# Patient Record
Sex: Female | Born: 1975
Health system: Southern US, Community
[De-identification: ages and names within clinical notes are randomized; demographics above are authoritative.]

## PROBLEM LIST (undated history)

## (undated) DIAGNOSIS — T8859XA Other complications of anesthesia, initial encounter: Secondary | ICD-10-CM

## (undated) DIAGNOSIS — R002 Palpitations: Secondary | ICD-10-CM

## (undated) DIAGNOSIS — E669 Obesity, unspecified: Secondary | ICD-10-CM

## (undated) DIAGNOSIS — F32A Depression, unspecified: Secondary | ICD-10-CM

## (undated) DIAGNOSIS — R12 Heartburn: Secondary | ICD-10-CM

## (undated) DIAGNOSIS — Z98811 Dental restoration status: Secondary | ICD-10-CM

## (undated) DIAGNOSIS — J45990 Exercise induced bronchospasm: Secondary | ICD-10-CM

## (undated) DIAGNOSIS — E785 Hyperlipidemia, unspecified: Secondary | ICD-10-CM

## (undated) DIAGNOSIS — R011 Cardiac murmur, unspecified: Secondary | ICD-10-CM

## (undated) DIAGNOSIS — N979 Female infertility, unspecified: Secondary | ICD-10-CM

## (undated) DIAGNOSIS — M549 Dorsalgia, unspecified: Secondary | ICD-10-CM

## (undated) DIAGNOSIS — E611 Iron deficiency: Secondary | ICD-10-CM

## (undated) DIAGNOSIS — G4489 Other headache syndrome: Secondary | ICD-10-CM

## (undated) DIAGNOSIS — T4145XA Adverse effect of unspecified anesthetic, initial encounter: Secondary | ICD-10-CM

## (undated) DIAGNOSIS — F419 Anxiety disorder, unspecified: Secondary | ICD-10-CM

## (undated) DIAGNOSIS — I493 Ventricular premature depolarization: Secondary | ICD-10-CM

## (undated) DIAGNOSIS — J45909 Unspecified asthma, uncomplicated: Secondary | ICD-10-CM

## (undated) DIAGNOSIS — R112 Nausea with vomiting, unspecified: Secondary | ICD-10-CM

## (undated) DIAGNOSIS — R0602 Shortness of breath: Secondary | ICD-10-CM

## (undated) DIAGNOSIS — S60311A Abrasion of right thumb, initial encounter: Secondary | ICD-10-CM

## (undated) DIAGNOSIS — E119 Type 2 diabetes mellitus without complications: Secondary | ICD-10-CM

## (undated) DIAGNOSIS — Z9889 Other specified postprocedural states: Secondary | ICD-10-CM

## (undated) DIAGNOSIS — M6530 Trigger finger, unspecified finger: Secondary | ICD-10-CM

## (undated) DIAGNOSIS — J302 Other seasonal allergic rhinitis: Secondary | ICD-10-CM

## (undated) DIAGNOSIS — R7303 Prediabetes: Secondary | ICD-10-CM

## (undated) DIAGNOSIS — G5603 Carpal tunnel syndrome, bilateral upper limbs: Secondary | ICD-10-CM

## (undated) DIAGNOSIS — R5383 Other fatigue: Secondary | ICD-10-CM

## (undated) HISTORY — PX: TRIGGER FINGER RELEASE: SHX641

## (undated) HISTORY — DX: Unspecified asthma, uncomplicated: J45.909

## (undated) HISTORY — DX: Female infertility, unspecified: N97.9

## (undated) HISTORY — DX: Obesity, unspecified: E66.9

## (undated) HISTORY — DX: Dorsalgia, unspecified: M54.9

## (undated) HISTORY — DX: Depression, unspecified: F32.A

## (undated) HISTORY — DX: Shortness of breath: R06.02

## (undated) HISTORY — DX: Heartburn: R12

## (undated) HISTORY — DX: Other specified postprocedural states: Z98.890

## (undated) HISTORY — PX: DILATION AND CURETTAGE OF UTERUS: SHX78

## (undated) HISTORY — PX: OTHER SURGICAL HISTORY: SHX169

## (undated) HISTORY — DX: Trigger finger, unspecified finger: M65.30

## (undated) HISTORY — DX: Other fatigue: R53.83

## (undated) HISTORY — PX: ABDOMINAL HYSTERECTOMY: SHX81

## (undated) HISTORY — DX: Type 2 diabetes mellitus without complications: E11.9

## (undated) HISTORY — DX: Palpitations: R00.2

## (undated) HISTORY — DX: Anxiety disorder, unspecified: F41.9

## (undated) HISTORY — PX: LASIK: SHX215

## (undated) HISTORY — DX: Hyperlipidemia, unspecified: E78.5

---

## 2000-05-09 ENCOUNTER — Encounter: Payer: Self-pay | Admitting: *Deleted

## 2000-05-09 ENCOUNTER — Encounter: Admission: RE | Admit: 2000-05-09 | Discharge: 2000-05-09 | Payer: Self-pay | Admitting: *Deleted

## 2000-09-16 ENCOUNTER — Other Ambulatory Visit: Admission: RE | Admit: 2000-09-16 | Discharge: 2000-09-16 | Payer: Self-pay | Admitting: Obstetrics and Gynecology

## 2000-09-19 ENCOUNTER — Encounter (INDEPENDENT_AMBULATORY_CARE_PROVIDER_SITE_OTHER): Payer: Self-pay | Admitting: Specialist

## 2000-09-19 ENCOUNTER — Ambulatory Visit (HOSPITAL_COMMUNITY): Admission: RE | Admit: 2000-09-19 | Discharge: 2000-09-19 | Payer: Self-pay | Admitting: Obstetrics and Gynecology

## 2001-04-13 ENCOUNTER — Ambulatory Visit (HOSPITAL_COMMUNITY): Admission: RE | Admit: 2001-04-13 | Discharge: 2001-04-13 | Payer: Self-pay | Admitting: Obstetrics and Gynecology

## 2001-04-15 ENCOUNTER — Ambulatory Visit (HOSPITAL_COMMUNITY): Admission: RE | Admit: 2001-04-15 | Discharge: 2001-04-15 | Payer: Self-pay | Admitting: Obstetrics and Gynecology

## 2001-06-05 ENCOUNTER — Ambulatory Visit (HOSPITAL_COMMUNITY): Admission: RE | Admit: 2001-06-05 | Discharge: 2001-06-05 | Payer: Self-pay | Admitting: Obstetrics and Gynecology

## 2001-10-16 ENCOUNTER — Other Ambulatory Visit: Admission: RE | Admit: 2001-10-16 | Discharge: 2001-10-16 | Payer: Self-pay | Admitting: Obstetrics and Gynecology

## 2002-02-17 ENCOUNTER — Ambulatory Visit (HOSPITAL_COMMUNITY): Admission: RE | Admit: 2002-02-17 | Discharge: 2002-02-17 | Payer: Self-pay | Admitting: Obstetrics and Gynecology

## 2002-02-18 ENCOUNTER — Encounter: Admission: RE | Admit: 2002-02-18 | Discharge: 2002-02-18 | Payer: Self-pay | Admitting: Obstetrics and Gynecology

## 2002-03-24 ENCOUNTER — Encounter (HOSPITAL_COMMUNITY): Admission: AD | Admit: 2002-03-24 | Discharge: 2002-04-23 | Payer: Self-pay | Admitting: Obstetrics and Gynecology

## 2002-04-21 ENCOUNTER — Ambulatory Visit (HOSPITAL_COMMUNITY): Admission: RE | Admit: 2002-04-21 | Discharge: 2002-04-21 | Payer: Self-pay | Admitting: Obstetrics and Gynecology

## 2002-04-23 ENCOUNTER — Inpatient Hospital Stay (HOSPITAL_COMMUNITY): Admission: RE | Admit: 2002-04-23 | Discharge: 2002-04-26 | Payer: Self-pay | Admitting: Obstetrics and Gynecology

## 2002-04-23 ENCOUNTER — Encounter (INDEPENDENT_AMBULATORY_CARE_PROVIDER_SITE_OTHER): Payer: Self-pay

## 2002-06-02 ENCOUNTER — Other Ambulatory Visit: Admission: RE | Admit: 2002-06-02 | Discharge: 2002-06-02 | Payer: Self-pay | Admitting: Obstetrics and Gynecology

## 2003-07-20 ENCOUNTER — Other Ambulatory Visit: Admission: RE | Admit: 2003-07-20 | Discharge: 2003-07-20 | Payer: Self-pay | Admitting: Obstetrics and Gynecology

## 2004-10-04 ENCOUNTER — Other Ambulatory Visit: Admission: RE | Admit: 2004-10-04 | Discharge: 2004-10-04 | Payer: Self-pay | Admitting: Obstetrics and Gynecology

## 2005-02-02 ENCOUNTER — Ambulatory Visit (HOSPITAL_COMMUNITY): Admission: RE | Admit: 2005-02-02 | Discharge: 2005-02-02 | Payer: Self-pay | Admitting: Obstetrics and Gynecology

## 2005-03-28 ENCOUNTER — Encounter (INDEPENDENT_AMBULATORY_CARE_PROVIDER_SITE_OTHER): Payer: Self-pay | Admitting: *Deleted

## 2005-03-28 ENCOUNTER — Inpatient Hospital Stay (HOSPITAL_COMMUNITY): Admission: AD | Admit: 2005-03-28 | Discharge: 2005-03-31 | Payer: Self-pay | Admitting: Obstetrics and Gynecology

## 2005-05-08 ENCOUNTER — Other Ambulatory Visit: Admission: RE | Admit: 2005-05-08 | Discharge: 2005-05-08 | Payer: Self-pay | Admitting: Obstetrics and Gynecology

## 2006-09-26 ENCOUNTER — Encounter (INDEPENDENT_AMBULATORY_CARE_PROVIDER_SITE_OTHER): Payer: Self-pay | Admitting: *Deleted

## 2006-09-26 ENCOUNTER — Ambulatory Visit: Payer: Self-pay | Admitting: Internal Medicine

## 2007-02-26 ENCOUNTER — Encounter: Admission: RE | Admit: 2007-02-26 | Discharge: 2007-05-27 | Payer: Self-pay | Admitting: Obstetrics and Gynecology

## 2007-04-28 ENCOUNTER — Ambulatory Visit (HOSPITAL_COMMUNITY): Admission: RE | Admit: 2007-04-28 | Discharge: 2007-04-28 | Payer: Self-pay | Admitting: *Deleted

## 2009-07-05 ENCOUNTER — Emergency Department (HOSPITAL_COMMUNITY): Admission: EM | Admit: 2009-07-05 | Discharge: 2009-07-05 | Payer: Self-pay | Admitting: Family Medicine

## 2009-07-06 ENCOUNTER — Emergency Department (HOSPITAL_COMMUNITY): Admission: EM | Admit: 2009-07-06 | Discharge: 2009-07-06 | Payer: Self-pay | Admitting: Emergency Medicine

## 2009-08-09 ENCOUNTER — Encounter: Admission: RE | Admit: 2009-08-09 | Discharge: 2009-09-01 | Payer: Self-pay | Admitting: Internal Medicine

## 2009-12-12 ENCOUNTER — Encounter
Admission: RE | Admit: 2009-12-12 | Discharge: 2009-12-27 | Payer: Self-pay | Admitting: Physical Medicine & Rehabilitation

## 2009-12-13 ENCOUNTER — Ambulatory Visit: Payer: Self-pay | Admitting: Physical Medicine & Rehabilitation

## 2009-12-21 ENCOUNTER — Ambulatory Visit (HOSPITAL_COMMUNITY)
Admission: RE | Admit: 2009-12-21 | Discharge: 2009-12-21 | Payer: Self-pay | Admitting: Physical Medicine & Rehabilitation

## 2009-12-27 ENCOUNTER — Ambulatory Visit: Payer: Self-pay | Admitting: Physical Medicine & Rehabilitation

## 2010-02-16 ENCOUNTER — Ambulatory Visit (HOSPITAL_COMMUNITY): Admission: RE | Admit: 2010-02-16 | Discharge: 2010-02-16 | Payer: Self-pay | Admitting: Gynecology

## 2011-03-04 LAB — POCT PREGNANCY, URINE: Preg Test, Ur: NEGATIVE

## 2011-04-13 NOTE — Op Note (Signed)
[facility]  Patient:    Margaret [name], Margaret [name] Visit Number: [id number] MRN: [id number]          Service Type: ANT Location: [facility] Attending Physician:  Minette Headland Dictated by:   Juluis Mire, M.D. Proc. Date: 04/23/02 Admit Date:  03/24/2002 Discharge Date: 04/23/2002                             Operative Report  PREOPERATIVE DIAGNOSES:       Twin pregnancy at 37 weeks with mature LS and PG, both infants in the transverse lie.  POSTOPERATIVE DIAGNOSES:      Twin pregnancy at 37 weeks with mature LS and PG, both infants in the transverse lie.  OPERATIVE PROCEDURE:          Primary low transverse cesarean section.  SURGEON:                      Juluis Mire, M.D.  ANESTHESIA:                   Spinal.  ESTIMATED BLOOD LOSS:         800 cc.  PACKS AND DRAINS:             None.  INTRAOPERATIVE BLOOD PLACED:  None.  COMPLICATIONS:                None.  INDICATIONS:                  Dictated in history and physical.  PROCEDURE AS FOLLOWS:         Patient taken to the OR and placed in supine position with left lateral tilt.  After a satisfactory level of spinal anesthesia was obtained, the abdomen was prepped out with Betadine, draped as a sterile field.  Low transverse skin incision made with the knife and carried through subcutaneous tissue.  The anterior rectus fascia was entered sharply and the incision of the fascia extended laterally.  The fascia taken off the muscles superiorly and inferiorly.  Rectus muscles were separated in the midline.  Perineum was entered sharply and the incision and perineum extended both superiorly and inferiorly.  Low transverse bladder flap was developed. Low transverse uterine incision was begun with the knife and extended laterally using manual retraction.  Twin A presented in the transverse lie back down presentation.  Membranes were ruptured.  Fluid was clear.  Was converted to a breech  presentation and delivered.  The infant was a viable female who weighed 5 pounds 5 ounces.  Apgars were 8/8.  Umbilical cord pH was 7.33.  Twin B was also transverse.  We converted to a vertex and delivered with elevation of head and fundal pressure.  Amniotic fluid was clear.  Twin B was a viable female weighing 6 pounds 4 ounces.  Apgars were 8/8.  Umbilical cord pH was 7.31.  Placenta was then delivered manually and sent for pathologic review.  Uterus wiped free of remaining membranes and placenta. Uterus closed interlocking suture of 0 chromic using a two layer closure technique.  Some bleeding from the right side of the incision brought under control with figure-of-eight of 0 chromic.  Uterus exteriorized.  Tubes and ovaries were unremarkable.  Uterus was returned to the abdominal cavity. Pelvic cavity was thoroughly irrigated.  Hemostasis was excellent.  Urine output was clear and adequate.  Muscles reapproximated running suture  of 3-0 Vicryl.  Fascia closed with running suture of 0 PDS.  Skin was closed with staples and Steri-Strips.  Sponge, instrument, needle count reported as correct by circulated nurse x2.  Patient tolerated procedure well.  Was returned to recovery room in good condition. Dictated by:   Juluis Mire, M.D. Attending Physician:  Minette Headland DD:  04/23/02 TD:  04/24/02 Job: [id number]

## 2011-04-13 NOTE — Assessment & Plan Note (Signed)
[facility]                         GASTROENTEROLOGY OFFICE NOTE   LINDSIE, SIMAR                         MRN:          [id number]  DATE:10/09/2007                            DOB:          07/10/1976    Betheny has been having some rare rectal bleeding, and some post  defecation perianal pruritus.  She had been given some Calmoseptine,  which helped, but kind of burned, and she feels a protrusion at the anus  as well.   Anoscopic exam is performed after inspection of an anorectal showed a  small tag at the 9 o'clock position while she was in the left lateral  decubitus position.  Anoscopy showed some small hemorrhoids in the  canal.  These are slightly inflamed.  There is no significant perianal  dermatitis.   Note:  Medical staff was present while I performed the exam.   ASSESSMENT:  I think she has internal hemorrhoid symptoms and problems.   PLAN:  Analpram cream to be used intermittently.  She is also to try  some sitz baths.  She will let me know how this is going.   Her bowel movements are regular, though these things flared up when she  was taking some iron, and she was somewhat constipated.     Iva Boop, MD,FACG  Electronically Signed    CEG/MedQ  DD: 10/09/2007  DT: 10/10/2007  Job #: [id number]

## 2011-04-13 NOTE — Op Note (Signed)
Margaret [name], Margaret [name]                ACCOUNT NO.:  [id number]   MEDICAL RECORD NO.:  [id number]          PATIENT TYPE:  INP   LOCATION:  9134                          [facility]ILITY[facility]   PHYSICIAN:  Duke Salvia. Marcelle Overlie, M.D.DATE OF BIRTH:  01/02/1976   DATE OF PROCEDURE:  03/28/2005  DATE OF DISCHARGE:                                 OPERATIVE REPORT   PREOPERATIVE DIAGNOSIS:  1.  Previous cesarean section.  2.  36-2/[redacted] weeks gestation.  3.  IUGR with oligohydramnios.   POSTOPERATIVE DIAGNOSIS:  1.  Previous cesarean section.  2.  36-2/[redacted] weeks gestation.  3.  IUGR with oligohydramnios.   PROCEDURE:  Repeat low transverse cesarean section.   SURGEON:  Duke Salvia. Marcelle Overlie, M.D.   ANESTHESIA:  Spinal.   COMPLICATIONS:  None.   DRAINS:  Foley catheter.   ESTIMATED BLOOD LOSS:  800 mL.   DESCRIPTION OF PROCEDURE:  The patient was taken to the operating room.  After an adequate level of spinal anesthetic was obtained with the patient  in the left tilt position, the abdomen was prepped and draped in the usual  fashion for sterile abdominal procedures.  Foley catheter positioned  draining clear urine.  Transverse incision was made through the old scar  which was well-healed.  This was carried down to the fascia which was  incised and extended transversely.  Rectus muscle divided in the midline.  Peritoneum entered superiorly without incident and extended vertically.  The  bladder blade was positioned.  The vesicouterine serosa was incised and the  bladder was bluntly and sharply dissected off of the lower uterine segment.  A moderate amount of bladder scarring was noted.  The decision was made to  stay a little bit higher on the uterus which was incised in the lower  segment, extended with bandage scissors transversely.  Clear fluid then  noted.  The patient delivered of a 4 pound 4 ounce female, Apgars 9 and 9, pH  7.32.  The infant was suctioned.  Cord clamped and passed to the  pediatric  team for further care.  The placenta was removed manually intact, was sent  to pathology.  The uterus was exteriorized.  The cavity was wiped clean with  laparotomy pack.  Closure obtained in the first layer of 0 chromic in a  locked fashion followed by an imbricating layer of 0 chromic.  Additional  interrupted sutures were required for complete hemostasis due to the  scarring.  The bladder was retrofilled with 300 mL of sterile milk solution  and was noted to be intact.  The uterine incision was hemostatic.  Tubes and  ovaries were normal.  Prior to closure, needle, sponge, and instrument  counts correct x2.  The peritoneum closed with a running 3-0 Vicryl suture.  The rectus muscle reapproximated in the  midline with 3-0 Vicryl sutures.  The fascia closed from laterally to  midline on either side with a 0 PDS suture.  The subcutaneous fat was  hemostatic.  Clips and Steri-Strips were used on the skin along with a  pressure dressing.  She did  receive Pitocin IV and Ancef 1 gram IV after the  cord was clamped.  Mother and baby doing well at that point.      RMH/MEDQ  D:  03/28/2005  T:  03/28/2005  Job:  [id number]

## 2011-04-13 NOTE — H&P (Signed)
[facility]  Patient:    Margaret [name], Margaret [name] Visit Number: [id number] MRN: [id number]          Service Type: ANT Location: [facility] Attending Physician:  Minette Headland Dictated by:   Juluis Mire, M.D. Admit Date:  03/24/2002 Discharge Date: 04/23/2002                           History and Physical  REASON FOR ADMISSION:         The patient is a 35 year old gravida 4 para 0 abortus 3 married white female who presents for primary cesarean section.  She is carrying twin pregnancy at 37 weeks.  Last menstrual period was August 07, 2001 giving her an estimated date of confinement of May 15, 2002.  This was consistent with early ultrasound and evaluation.  Again, the patients pregnancy has been complicated by twin pregnancy.  The infants have been in a transverse-transverse lie for the majority of her third trimester.  Because of increasing discomfort, the patient underwent amniocentesis on May 27.  L/S was 7.3/1 with PG.  Subsequent nonstress test was reactive.  The infant continued to present in the transverse lie presentation.  In view of the mature studies and the abnormal presentation, the patient will undergo primary cesarean section as scheduled.  Her prenatal course was complicated by history of recurrent pregnancy losses. She had declined any type of a genetic evaluation with this pregnancy.  Other complicating factors included gestational diabetes.  She was managed by diet and blood sugars remained excellent.  Most fastings were between 80 and 90; two hours were under 120.  Throughout the pregnancy she was managed with weekly nonstress testings starting at 32 weeks, and serial ultrasounds indicating no issues.  ALLERGIES:                    No known drug allergies.  MEDICATIONS:                  Prenatal vitamins.  PRENATAL LABORATORY DATA:     The patient is O negative with negative antibody screen.  Nonreactive serology.  Positive  rubella.  Negative hepatitis B surface antigen.  HIV is nonreactive.  For past medical history, family history, and social history please see prenatal records.  REVIEW OF SYSTEMS:            Noncontributory.  PHYSICAL EXAMINATION:  VITAL SIGNS:                  Patient is afebrile with stable vital signs.  HEENT:                        Patient is normocephalic.  Pupils are equal, round, and reactive to light and accommodation.  Extraocular movements were intact.  Sclerae and conjunctivae were clear.  Oropharynx clear.  NECK:                         Without thyromegaly.  BREASTS:                      Glandular but no discrete masses.  LUNGS:                        Clear.  CARDIOVASCULAR:               Regular  rhythm and rate with a grade 2/6 systolic ejection murmur.  No clicks or gallops.  ABDOMEN:                      Confirmed a gravid uterus with fetal heart tones audible.  PELVIC:                       Deferred.  EXTREMITIES:                  Trace edema.  NEUROLOGIC:                   Grossly within normal limits.  Deep tendon reflexes 2+, no clonus.  IMPRESSION:                   1. Twin pregnancy at 37 weeks with mature L/S                                   ratio and prostaglandin.                               2. Transverse-transverse lie.                               3. Gestational diabetes.   PLAN:                         The patient to undergo primary cesarean section. The risks of surgery have been discussed, including the risk of infection; the risk of hemorrhage that could necessitate transfusion with the risk of AIDS or hepatitis; the risk of injury to adjacent organs including bladder, bowel, or ureters that could require further exploratory surgery; the risk of deep venous thrombosis and pulmonary embolus.  The patient professed an understanding of the indications and risks. Dictated by:   Juluis Mire, M.D. Attending Physician:  Minette Headland DD:  04/23/02 TD:  04/23/02 Job: [id number]

## 2011-04-13 NOTE — H&P (Signed)
[facility]  Patient:    Margaret [name], Margaret [name]                       MRN: [id number] Adm. Date:  [id number] Attending:  Frederich Balding                         History and Physical  HISTORY OF PRESENT ILLNESS:   The patient is a 35 year old, gravida 1, para 0, married white female who presents for D&E for management of nonviable first trimester pregnancy.  The patient was seen in the office for initial OB evaluation.  Fetal heart tones were not audible.  Subsequent ultrasound revealed a gestational sac with fetal pole consistent with 7 weeks 5 days, no fetal heart activity was noted, consistent with a nonviable frist trimester pregnancy.  She then had a follow-up ultrasound two days later to confirm and now presents for D&E for nonviable pregnancy.  I noted her blood type is O negative and RhoGAM will be required.  ALLERGIES:  No known drug allergies.  MEDICATIONS:  Prenatal vitamins.  PAST MEDICAL HISTORY:  Usual childhood diseases.  No significant sequelae.  No previous surgical or obstetrical history.  FAMILY HISTORY:  Noncontributory.  SOCIAL HISTORY:  Reveals no tobacco or alcohol use.  REVIEW OF SYSTEMS:  Noncontributory.  PHYSICAL EXAMINATION:  VITAL SIGNS:  The patient is afebrile with stable vital signs.  HEENT:  The patient is normocephalic.  Pupils are equally round and reactive to light and accommodation.  Extraocular movements were intact.  Sclerae and conjunctivae clear.  Oropharynx clear.  NECK:  Without thyromegaly.  BREASTS:  No discrete masses, but glandular.  LUNGS:  Clear.  HEART:  Regular rate and rhythm with a grade 2/6 systolic ejection murmur, no clicks or gallops.  ABDOMEN:  Benign.  No masses, organomegaly, or tenderness.  PELVIC:  Normal external genitalia.  Vaginal mucosa is clear.  Cervix is unremarkable.  Minimal bleeding is noted.  The uterus is approximately 8 weeks in size.  Adnexa  unremarkable.  EXTREMITIES:  Trace edema.  NEUROLOGICAL:  Grossly within normal limits.  IMPRESSION:  Nonviable first trimester pregnancy.  PLAN:  The patient will undergo dilatation and evacuation.  The risks have been discussed including risks of infection, the risks of hemorrhage that could necessitate transfusion with the risk of AIDS or hepatitis or possible need for repeat D&C.  The risks of uterine perforation that could lead to injury to adjacent organs requiring diagnostic laparoscopy or exploratory laparotomy for management.  The patients blood type again is O negative and RhoGAM will be required. DD:  09/19/00 TD:  09/19/00 Job: [id number]

## 2011-04-13 NOTE — H&P (Signed)
Margaret [name], Margaret [name]                ACCOUNT NO.:  [id number]   MEDICAL RECORD NO.:  [id number]          PATIENT TYPE:  MAT   LOCATION:  [facility]                          [facility]ILITY:  [facility]   PHYSICIAN:  Duke Salvia. Marcelle Overlie, M.D.DATE OF BIRTH:  22-Jul-1976   DATE OF ADMISSION:  03/28/2005  DATE OF DISCHARGE:                                HISTORY & PHYSICAL   CHIEF COMPLAINT:  SGA.   HISTORY OF PRESENT ILLNESS:  A 35 year old G5 P1-0-3-2 at 4 and two-  sevenths weeks. The patient has a history of prior CS for twins in 2003. She  has gestational diabetes and was seen in the office today for a routine  ultrasound with ultrasound showing vertex, 32 weeks 6 days, EFW was 1834 g  which is right at the first percentile with an AFI of 6.0 cm, less than the  third percentile. She presents at this time for admission and further  evaluation.   Blood type is O negative, antibody screen was negative, rubella titer was  positive, group B strep screen has not been done yet.   PAST MEDICAL HISTORY:  Allergies:  None. Operations:  Cesarean section in  2003. She has had an SAB x3. History of carpal tunnel syndrome and  anovulation; she conceived with Clomid.   FAMILY HISTORY:  Please see the [facility] form for details.   PHYSICAL EXAMINATION:  VITAL SIGNS:  Temperature 98.2, blood pressure  112/80.  HEENT:  Unremarkable.  NECK:  Supple without masses.  LUNGS:  Clear.  CARDIOVASCULAR:  Regular rate and rhythm without murmurs, rubs, gallops  noted.  BREASTS:  Not examined.  ABDOMEN:  A 35 cm fundal height. Fetal heart rate was 140.  PELVIC:  Cervix was long and closed.  EXTREMITIES AND NEUROLOGIC:  Unremarkable.   IMPRESSION:  1. Thirty-six and two-sevenths weeks intrauterine pregnancy.  2. Prior cesarean.  3. Small for gestational age by ultrasound criteria with a low amniotic      fluid index.   PLAN:  Will admit and hydrate and monitor, and proceed with repeat cesarean  section per the patient's  request. This procedure including the risks of  bleeding, infection, transfusion, adjacent organ injury reviewed with her,  which she understands and accepts.       RMH/MEDQ  D:  03/28/2005  T:  03/28/2005  Job:  [id number]

## 2011-04-13 NOTE — Discharge Summary (Signed)
[facility]  Patient:    Margaret [name], Margaret [name] Visit Number: [id number] MRN: [id number]          Service Type: OBS Location: 910A [id number] [id number] Attending Physician:  Frederich Balding Dictated by:   Julio Sicks, N.P. Admit Date:  04/23/2002 Discharge Date: 04/26/2002                             Discharge Summary  ADMISSION DIAGNOSES:          1. Twin pregnancy at 37 weeks.                               2. Mature L/S ratio and prostaglandin.                               3. Transverse/transverse lie.                               4. Gestational diabetes.  DISCHARGE DIAGNOSES:          1. Status post cesarean section.                               2. Two Viable female infants.  PROCEDURE:                    Primary low transverse cesarean section.  REASON FOR ADMISSION:         Please see dictated H&P.  HOSPITAL COURSE:              The patient was admitted to [facility] for a scheduled primary cesarean section.  The patient had a twin gestation with transverse lie presentation of both twins.  Amniocentesis was performed revealing an L/S ratio of 7.31 with prostaglandin.  RhoGAM was administered in the office past amniocentesis for prophylaxis.  The patients pregnancy had also been complicated by gestational diabetes.  The patient was taken to the operating room for the above named procedure.  Spinal anesthesia was administered without difficulty.  A low transverse incision was made with the delivery of the first twin, a female infant weighing 5 pounds 5 ounces, Apgars were 8 at one minute and 8 at five minutes.  Umbilical cord pH revealed 7.33.  Twin B was delivered also a female infant weighing 6 pounds 4 ounces. Apgars were 8 at one minute and 8 at five minutes.  Umbilical cord pH was 7.31. The patient tolerated the procedure well and was taken to the recovery room in good condition.  On postoperative day #1, the patient had good  return of bowel function and she was ambulating well in her room.  Abdomen was soft. Abdominal dressing was clean, dry, and intact.  Lungs were clear to auscultation.  Foley was discontinued and the patient was voiding without difficulty.  Labs revealed a hemoglobin of 9.4, hematocrit 27.8, WBC count of 10.7.  On postoperative day #2, abdomen was soft.  Incision was clean, dry, and intact.  The patient was tolerating a regular diet without complaints of nausea and vomiting.  On postoperative day #3, the patient was doing well. Incision was clean, dry, and intact.  Staples were removed and the patient was discharged  home.  CONDITION ON DISCHARGE:       Good.  DIET:                         Regular as tolerated.  FOLLOW-UP:                    The patient is to follow up in the office in one to two weeks for an incision check.  She is to call for a temperature greater than 100 degrees, persistent nausea and vomiting, heavy vaginal bleeding, and/or redness or drainage from the incision site.  DISCHARGE MEDICATIONS:        1. Tylox one p.o. every four to six hours p.r.n.                                  pain.                               2. Prenatal vitamins one p.o. daily. Dictated by:   Julio Sicks, N.P. Attending Physician:  Frederich Balding DD:  05/17/02 TD:  05/18/02 Job: [id number]

## 2012-11-26 HISTORY — PX: REDUCTION MAMMAPLASTY: SUR839

## 2012-12-04 ENCOUNTER — Other Ambulatory Visit: Payer: Self-pay | Admitting: Obstetrics and Gynecology

## 2013-08-04 ENCOUNTER — Other Ambulatory Visit: Payer: Self-pay

## 2013-08-04 DIAGNOSIS — Z01818 Encounter for other preprocedural examination: Secondary | ICD-10-CM

## 2013-08-04 DIAGNOSIS — Z1231 Encounter for screening mammogram for malignant neoplasm of breast: Secondary | ICD-10-CM

## 2013-08-25 ENCOUNTER — Ambulatory Visit
Admission: RE | Admit: 2013-08-25 | Discharge: 2013-08-25 | Disposition: A | Discharge status: Home / Self Care | Payer: 59 | Source: Ambulatory Visit

## 2013-08-25 DIAGNOSIS — Z1231 Encounter for screening mammogram for malignant neoplasm of breast: Secondary | ICD-10-CM

## 2013-08-25 DIAGNOSIS — Z01818 Encounter for other preprocedural examination: Secondary | ICD-10-CM

## 2013-09-14 ENCOUNTER — Telehealth: Payer: Self-pay | Admitting: Oncology

## 2013-09-14 NOTE — Telephone Encounter (Signed)
C/D 09/14/13 for appt. 10/07/13

## 2013-09-14 NOTE — Telephone Encounter (Signed)
S/W PT AND GVE NP APPT 11/12 @ 10:30 W/DR. LIVESAY REFERRING DR. Lupita Leash [place] DX-[place] WELCOME PACKET MAILED.

## 2013-10-07 ENCOUNTER — Other Ambulatory Visit (HOSPITAL_BASED_OUTPATIENT_CLINIC_OR_DEPARTMENT_OTHER): Payer: 59 | Admitting: Lab

## 2013-10-07 ENCOUNTER — Telehealth: Payer: Self-pay | Admitting: Oncology

## 2013-10-07 ENCOUNTER — Telehealth: Payer: Self-pay

## 2013-10-07 ENCOUNTER — Ambulatory Visit: Payer: 59

## 2013-10-07 ENCOUNTER — Encounter (INDEPENDENT_AMBULATORY_CARE_PROVIDER_SITE_OTHER): Payer: Self-pay

## 2013-10-07 ENCOUNTER — Ambulatory Visit (HOSPITAL_BASED_OUTPATIENT_CLINIC_OR_DEPARTMENT_OTHER): Payer: 59 | Admitting: Oncology

## 2013-10-07 ENCOUNTER — Encounter: Payer: Self-pay | Admitting: Oncology

## 2013-10-07 ENCOUNTER — Other Ambulatory Visit: Payer: Self-pay | Admitting: Oncology

## 2013-10-07 VITALS — BP 119/81 | HR 76 | Temp 97.9°F | Resp 20 | Ht 63.0 in | Wt 204.3 lb

## 2013-10-07 DIAGNOSIS — D509 Iron deficiency anemia, unspecified: Secondary | ICD-10-CM

## 2013-10-07 DIAGNOSIS — D500 Iron deficiency anemia secondary to blood loss (chronic): Secondary | ICD-10-CM

## 2013-10-07 DIAGNOSIS — N920 Excessive and frequent menstruation with regular cycle: Secondary | ICD-10-CM

## 2013-10-07 LAB — CBC WITH DIFFERENTIAL/PLATELET
Basophils Absolute: 0.1 10*3/uL (ref 0.0–0.1)
EOS%: 0.9 % (ref 0.0–7.0)
Eosinophils Absolute: 0.1 10*3/uL (ref 0.0–0.5)
HGB: 11.6 g/dL (ref 11.6–15.9)
LYMPH%: 44.7 % (ref 14.0–49.7)
MCH: 26.7 pg (ref 25.1–34.0)
MCV: 82.7 fL (ref 79.5–101.0)
MONO%: 4.8 % (ref 0.0–14.0)
NEUT#: 4.1 10*3/uL (ref 1.5–6.5)
Platelets: 314 10*3/uL (ref 145–400)
RBC: 4.33 10*6/uL (ref 3.70–5.45)
RDW: 14.3 % (ref 11.2–14.5)

## 2013-10-07 LAB — COMPREHENSIVE METABOLIC PANEL (CC13)
ALT: 13 U/L (ref 0–55)
AST: 12 U/L (ref 5–34)
Alkaline Phosphatase: 44 U/L (ref 40–150)
Anion Gap: 9 mEq/L (ref 3–11)
BUN: 15.5 mg/dL (ref 7.0–26.0)
CO2: 24 mEq/L (ref 22–29)
Creatinine: 0.8 mg/dL (ref 0.6–1.1)
Sodium: 139 mEq/L (ref 136–145)
Total Bilirubin: 0.33 mg/dL (ref 0.20–1.20)
Total Protein: 7.3 g/dL (ref 6.4–8.3)

## 2013-10-07 LAB — IRON AND TIBC CHCC
%SAT: 12 % — ABNORMAL LOW (ref 21–57)
Iron: 44 ug/dL (ref 41–142)
UIBC: 315 ug/dL (ref 120–384)

## 2013-10-07 LAB — MORPHOLOGY

## 2013-10-07 LAB — FERRITIN CHCC: Ferritin: 10 ng/ml (ref 9–269)

## 2013-10-07 MED ORDER — DOCUSATE SODIUM 100 MG PO CAPS: 100.0000 mg | ORAL_CAPSULE | Freq: Every day | ORAL | Status: DC

## 2013-10-07 MED ORDER — FERROUS FUMARATE 324 MG PO TABS: ORAL_TABLET | ORAL | Status: DC

## 2013-10-07 NOTE — Progress Notes (Signed)
Checked in new patient with no financial issues. She has not been to [place]. °

## 2013-10-07 NOTE — Progress Notes (Signed)
[facility] NEW PATIENT EVALUATION   Name: Margaret [name] Date: 10/07/2013 MRN: [id number] DOB: 1976-05-23  REFERRING PHYSICIAN: Hollice Espy, MD  MDs: H.Holderness, J.McComb, C.Gessner, psychiatrist name not known   REASON FOR REFERRAL: iron deficiency anemia   HISTORY OF PRESENT ILLNESS:Margaret [name] is a 37 y.o. female who is seen in consultation at the request of Dr Kevan Ny due to iron deficiency anemia with upcoming breast reduction surgery. She is alone for visit today, with history from Dr Kevan Ny' records, this EMR and patient. The breast surgery is planned Dec 2 by Dr Stephens November. Patient has history of anemia, tho only prior CBC available now is from Dr Kevan Ny' information 09-03-13 with WBC 4.0, Hgb 11.8, MCV 82.6 and plt 291k, with serum iron 50, %sat 13 and ferritin 7.8. BMET from same date was normal including creatinine 0.74. Patient reports heavy menses lasting 5-6 days, then spotting ~ every other week between periods; endometrial biopsy by Dr Arelia Sneddon [phone] had benign proliferative endometrium. She has not seen other bleeding. She has tolerated OTC iron poorly in past, mostly with constipation. She has had upper endoscopy by Dr Leone Payor, that report not available to me but apparently not remarkable. She complains of being tired all the time, but does work night shifts as Charity fundraiser in [employer] and probably does not sleep adequately during days as she has 3 young sons. She does not have frank SOB and no cardiac type chest pain.  REVIEW OF SYSTEMS as above, also: Frequent HA for which she uses ibuprofen often twice weekly, does not necessarily take this with food. HA seem to be tension and neck strain, in part reason for upcoming breast reduction surgery. Has reading glasses which she does not use regularly Hearing fine, no dental problems and up to date on cleanings. No known thyroid problems. Usually constipated. Chronic back and shoulder pain with disc findings on MRI. Occasional  PVCs with caffeine. No bladder symptoms. Has lost 12 lbs intentionally prior to upcoming surgery.    Remainder of full 10 point review of systems negative.   ALLERGIES: Amoxicillin and Penicillins  PAST MEDICAL/ SURGICAL HISTORY:    C section with twins 2003, C section 2012. 5 fetal loss/ spontaneous Ab. Used Clomid for anovulation Gestational DM Lasik Carpel tunnel  Mammograms done in preparation for upcoming surgery No history of blood clots  CURRENT MEDICATIONS: reviewed as listed now in EMR  PHARMACY [facility]   SOCIAL HISTORY: Originally from [place], in [place] x 14 years. Lives with husband, 88 yo twin boys and 62 yo boy. 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 Second hand tobacco until age 67, never smoker herself, rare ETOH. Both parents died in past year.   FAMILY HISTORY:  Father with ETOH abuse and bipolar, died in 02-09-2014of either aneurysm or OD Mother died at 44 of COPD, in Mar 12, 2014One brother with drug abuse and cardiac problems Paternal GM diabetes Sons healthy except ADHD          PHYSICAL EXAM:  height is 5\' 3"  (1.6 m) and weight is 204 lb 4.8 oz (92.67 kg). Her oral temperature is 97.9 F (36.6 C). Her blood pressure is 119/81 and her pulse is 76. Her respiration is 20.  Alert, cooperative, seems very tired just off of night shift, good historian.   HEENT:normal hair pattern. PERRL, not icteric. Oral mucosa moist and clear, not markedly pale, tongue not smooth. Good  dentition. No JVD or apparent thyroid mass  RESPIRATORY:Lungs clear to A and P  CARDIAC/ VASCULAR: RRR without gallop  ABDOMEN:obese, soft, nontender including epigastrium, normal bowel sounds, no appreciable HSM  LYMPH NODES:no cervical, supraclavicular, axillary adenopathy :  NEUROLOGIC: CN, motor, sensory, cerebellar nonfocal  SKIN: no rash, ecchymosis, petechiae  MUSCULOSKELETAL:no edema, cords, tenderness. Back  nontender.    LABORATORY DATA:  Results for orders placed in visit on 10/07/13 (from the past 48 hour(s))  CBC WITH DIFFERENTIAL     Status: Abnormal   Collection Time    10/07/13 10:44 AM      Result Value Range   WBC 8.3  3.9 - 10.3 10e3/uL   NEUT# 4.1  1.5 - 6.5 10e3/uL   HGB 11.6  11.6 - 15.9 g/dL   HCT 16.1  09.6 - 04.5 %   Platelets 314  145 - 400 10e3/uL   MCV 82.7  79.5 - 101.0 fL   MCH 26.7  25.1 - 34.0 pg   MCHC 32.3  31.5 - 36.0 g/dL   RBC 4.09  8.11 - 9.14 10e6/uL   RDW 14.3  11.2 - 14.5 %   lymph# 3.7 (*) 0.9 - 3.3 10e3/uL   MONO# 0.4  0.1 - 0.9 10e3/uL   Eosinophils Absolute 0.1  0.0 - 0.5 10e3/uL   Basophils Absolute 0.1  0.0 - 0.1 10e3/uL   NEUT% 49.0  38.4 - 76.8 %   LYMPH% 44.7  14.0 - 49.7 %   MONO% 4.8  0.0 - 14.0 %   EOS% 0.9  0.0 - 7.0 %   BASO% 0.6  0.0 - 2.0 %  MORPHOLOGY     Status: None   Collection Time    10/07/13 10:44 AM      Result Value Range   Polychromasia Slight  Slight   Ovalocytes Few  Negative   White Cell Comments Variant Lymphs     PLT EST Adequate  Adequate  FERRITIN [facility]     Status: None   Collection Time    10/07/13 10:44 AM      Result Value Range   Ferritin 10  9 - 269 ng/ml  IRON AND TIBC [facility]     Status: Abnormal   Collection Time    10/07/13 10:44 AM      Result Value Range   Iron 44  41 - 142 ug/dL   TIBC 782  956 - 213 ug/dL   UIBC 086  578 - 469 ug/dL   %SAT 12 (*) 21 - 57 %  COMPREHENSIVE METABOLIC PANEL (CC13)     Status: None   Collection Time    10/07/13 10:44 AM      Result Value Range   Sodium 139  136 - 145 mEq/L   Potassium 4.0  3.5 - 5.1 mEq/L   Chloride 106  98 - 109 mEq/L   CO2 24  22 - 29 mEq/L   Glucose 96  70 - 140 mg/dl   BUN 62.9  7.0 - 52.8 mg/dL   Creatinine 0.8  0.6 - 1.1 mg/dL   Total Bilirubin 4.13  0.20 - 1.20 mg/dL   Alkaline Phosphatase 44  40 - 150 U/L   AST 12  5 - 34 U/L   ALT 13  0 - 55 U/L   Total Protein 7.3  6.4 - 8.3 g/dL   Albumin 4.1  3.5 - 5.0 g/dL   Calcium 9.2   8.4 - 24.4 mg/dL   Anion Gap 9  3 - 11 mEq/L      PATHOLOGY: none DIGITAL SCREENING BILATERAL MAMMOGRAM WITH CAD  Comparison: None  FINDINGS:  ACR Breast Density Category a: The breast tissue is almost  entirely fatty.  There are no findings suspicious for malignancy.  Images were processed with CAD.  IMPRESSION:  No mammographic evidence of malignancy.  A result letter of this screening mammogram will be mailed directly  to the patient.  RECOMMENDATION:  Screening mammogram in one year. (Code:SM-B-01Y)  BI-RADS CATEGORY 1: Negative.     DISCUSSION: I have discussed all of history above with patient and reviewed previous labs as well as CBC today at time of visit; other labs will be reviewed with her by phone as they resulted after visit. I have reassured her that she is not significantly anemic now even tho iron stores are low, and have recommended that she try oral ferrous fumarate, as this is generally much easier from standpoint of GI side effects that ferrous sulfate. I have spoken with pharmacist at [facility], however [facility] as prescription is not available there; they do have ferrous fumarate which she will take on empty stomach with OJ or with Vit C, at least once daily or twice daily if tolerates. She will discuss timing of other medications in relation to this with pharmacist. I believe oral iron is reasonable in this situation given good hemoglobin and possible allergic reactions to IV preparations. I would not expect major blood loss with the breast reduction surgery and she notes that Dr Stephens November did not mention any lower limit of hemoglobin necessary for the procedure. I have encouraged her to follow up with Dr Arelia Sneddon as already scheduled in Jan to discuss the heavy menstrual bleeding.     IMPRESSION / PLAN:  1.iron deficiency which appears related to gyn blood loss: trial of ferrous fumarate as above. I will see her back after surgery, probably while she is  still out of work anticipated x5 weeks then, with repeat blood counts. 2.elective bilateral breast reduction surgery planned 10-27-13 due to chronic upper back and neck pain 3.fatigue likely multifactorial but including night shift work 4.gestational DM 5.obesity 6.hx carpal tunnel symptoms     Patient has had questions answered to her satisfaction and is in agreement with plan above. She can contact this office for questions or concerns at any time prior to next scheduled visit.  Time spent  30 min , including >50% discussion and coordination of care.    Aeon Koors P, MD 10/07/2013 1:20 PM

## 2013-10-07 NOTE — Telephone Encounter (Signed)
Message copied by Lorine Bears on Wed Oct 07, 2013  3:22 PM ------      Message from: Jama Flavors P      Created: Wed Oct 07, 2013  1:49 PM       Labs seen and need follow up: please let her know or send her copies of all labs today. I repeated the iron studies to confirm, and they are close to same as Dr Kevan Ny had. 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

## 2013-10-07 NOTE — Patient Instructions (Signed)
Ferrous fumarate ~ 324 mg once or twice daily. Take on empty stomach with OJ or Vitamin C. Take colace daily with iron.

## 2013-10-07 NOTE — Telephone Encounter (Signed)
Spoke with Margaret [name] and told her the results of the iron studies as noted below from Dr. Darrold Span.  Gave Margaret [name] the reults of the labs.  She will review them again on [facility].  Ahe did not need a copy mailed to her at this time.

## 2013-10-08 ENCOUNTER — Encounter: Payer: 59 | Attending: Family Medicine | Admitting: Dietician

## 2013-10-08 ENCOUNTER — Encounter: Payer: Self-pay | Admitting: Dietician

## 2013-10-08 VITALS — Ht 63.0 in | Wt 201.5 lb

## 2013-10-08 DIAGNOSIS — Z713 Dietary counseling and surveillance: Secondary | ICD-10-CM | POA: Insufficient documentation

## 2013-10-08 DIAGNOSIS — E669 Obesity, unspecified: Secondary | ICD-10-CM

## 2013-10-08 NOTE — Progress Notes (Signed)
  Medical Nutrition Therapy:  Appt start time: 0900 end time:  1000.  Assessment:  Primary concerns today: Margaret [name] is here today to try to get her weight "under control". Lost about 13 pounds since late September. States she is having a breast reduction surgery scheduled for 10/27/13.  Works at night as a Engineer, civil (consulting) and has trouble figuring out eating schedule. Lives with husband and two children who are overweight. Family is currently follow a [place] Diet with family. Started [place] in late September. States that she plans the meals and her husband prepares most of the meals.     Weight has fluctuated up to 216 lbs and has lost down to 190 lbs several times by following the [facility] Diet.   Preferred Learning Style:   No preference indicated   Learning Readiness:   Ready  Change in progress   MEDICATIONS: See List   DIETARY INTAKE:  Usual eating pattern includes 2 meals and 2-3 snacks per day (when working).  Avoided foods include eggs, bacon, milk, cereal (serving too small)   24-hr recall:  B ( AM):usually nothing or Slim Fast  Snk ( AM): none L ( PM): bag of chips and Diet Coke or sandwich pb&b or ham and cheese with unsweet tea or diet soda Snk ( PM): none D ( PM): work days - peanut butter and jelly, leftovers or steak, pork green beans, mac and cheese  Snk ( PM): slim jims, sugar free pudding, popcorn, crackers and cheese, pork skins, granola bars, apples with peanut butter (every 2-3 hours) Beverages:unsweet tea or diet soda  On work days will sleep until 2 and eat first at 6 PM. Usual physical activity: bike for 40 minutes 2-3 x week, but didn't do it last week or this week  Estimated energy needs: 1800 calories 200 g carbohydrates 135 g protein 50 g fat  Progress Towards Goal(s):  In progress.   Nutritional Diagnosis:  -3.3 Overweight/obesity As related to history of energy dense food choices and inadequate physical activity.  As evidenced by BMI of  35.7.    Intervention:  Nutrition counseling provided. Recommended that [name] continue with her eating plan and add physical activity when possible. Encouraged her to have more vegetables and try to eat when she is hungry, especially at home.   Plan: Try to eat 3 meals and 2-3 snacks per day if possible. Pair carbohydrates with protein for snacks. Try to eat meals when you are hungry.  Continue drinking mostly water or unsweet tea. Aim to get 30 minutes of exercise per week (150 minutes per week). Fill half of your plate with vegetables and continue limiting starch to a quarter of your plate (30 g).  Teaching Method Utilized:  Visual Auditory  Handouts given during visit include:  MyPlate Handout  15 g CHO Snacks  Barriers to learning/adherence to lifestyle change: very busy schedule, hard to fit in exercise and some meals  Demonstrated degree of understanding via:  Teach Back   Monitoring/Evaluation:  Dietary intake, exercise, and body weight in 3 month(s).

## 2013-10-08 NOTE — Patient Instructions (Signed)
Try to eat 3 meals and 2-3 snacks per day if possible. Pair carbohydrates with protein for snacks. Try to eat meals when you are hungry.  Continue drinking mostly water or unsweet tea. Aim to get 30 minutes of exercise per week (150 minutes per week). Fill half of your plate with vegetables and continue limiting starch to a quarter of your plate (30 g).

## 2013-10-09 ENCOUNTER — Encounter: Payer: Self-pay | Admitting: Oncology

## 2013-10-09 ENCOUNTER — Telehealth: Payer: Self-pay | Admitting: *Deleted

## 2013-10-09 DIAGNOSIS — D500 Iron deficiency anemia secondary to blood loss (chronic): Secondary | ICD-10-CM | POA: Insufficient documentation

## 2013-10-09 NOTE — Telephone Encounter (Signed)
sw pt gv appt for 11/30/13 w/labs @ 8:30am and ov @ 9am. Pt is aware...td

## 2013-10-19 ENCOUNTER — Encounter (HOSPITAL_BASED_OUTPATIENT_CLINIC_OR_DEPARTMENT_OTHER): Payer: Self-pay | Admitting: *Deleted

## 2013-10-19 NOTE — Pre-Procedure Instructions (Signed)
Discussed Hgb. of 11.6 with Dr. Sampson Goon; is OK for surgery.

## 2013-10-27 ENCOUNTER — Ambulatory Visit (HOSPITAL_BASED_OUTPATIENT_CLINIC_OR_DEPARTMENT_OTHER)
Admission: RE | Admit: 2013-10-27 | Discharge: 2013-10-28 | Disposition: A | Discharge status: Home / Self Care | Payer: 59 | Source: Ambulatory Visit | Attending: Plastic Surgery | Admitting: Plastic Surgery

## 2013-10-27 ENCOUNTER — Encounter (HOSPITAL_BASED_OUTPATIENT_CLINIC_OR_DEPARTMENT_OTHER): Payer: Self-pay | Admitting: Anesthesiology

## 2013-10-27 ENCOUNTER — Encounter (HOSPITAL_BASED_OUTPATIENT_CLINIC_OR_DEPARTMENT_OTHER): Payer: 59 | Admitting: Anesthesiology

## 2013-10-27 ENCOUNTER — Ambulatory Visit (HOSPITAL_BASED_OUTPATIENT_CLINIC_OR_DEPARTMENT_OTHER): Payer: 59 | Admitting: Anesthesiology

## 2013-10-27 ENCOUNTER — Encounter (HOSPITAL_BASED_OUTPATIENT_CLINIC_OR_DEPARTMENT_OTHER)
Admission: RE | Disposition: A | Discharge status: Home / Self Care | Payer: Self-pay | Source: Ambulatory Visit | Attending: Plastic Surgery

## 2013-10-27 DIAGNOSIS — E669 Obesity, unspecified: Secondary | ICD-10-CM | POA: Insufficient documentation

## 2013-10-27 DIAGNOSIS — N62 Hypertrophy of breast: Secondary | ICD-10-CM | POA: Insufficient documentation

## 2013-10-27 DIAGNOSIS — Z9889 Other specified postprocedural states: Secondary | ICD-10-CM

## 2013-10-27 HISTORY — DX: Other seasonal allergic rhinitis: J30.2

## 2013-10-27 HISTORY — DX: Anxiety disorder, unspecified: F41.9

## 2013-10-27 HISTORY — DX: Other specified postprocedural states: R11.2

## 2013-10-27 HISTORY — DX: Carpal tunnel syndrome, bilateral upper limbs: G56.03

## 2013-10-27 HISTORY — DX: Other complications of anesthesia, initial encounter: T88.59XA

## 2013-10-27 HISTORY — DX: Other specified postprocedural states: Z98.890

## 2013-10-27 HISTORY — PX: BREAST REDUCTION SURGERY: SHX8

## 2013-10-27 HISTORY — DX: Iron deficiency: E61.1

## 2013-10-27 HISTORY — DX: Adverse effect of unspecified anesthetic, initial encounter: T41.45XA

## 2013-10-27 LAB — POCT HEMOGLOBIN-HEMACUE: Hemoglobin: 12.3 g/dL (ref 12.0–15.0)

## 2013-10-27 SURGERY — MAMMOPLASTY, REDUCTION
Anesthesia: General | Site: Breast | Laterality: Bilateral

## 2013-10-27 MED ORDER — FENTANYL CITRATE 0.05 MG/ML IJ SOLN: 50.0000 ug | Freq: Once | INTRAMUSCULAR | Status: DC

## 2013-10-27 MED ORDER — HYDROMORPHONE HCL PF 1 MG/ML IJ SOLN
0.2500 mg | INTRAMUSCULAR | Status: DC | PRN
  Administered 2013-10-27: 0.5 mg via INTRAVENOUS

## 2013-10-27 MED ORDER — CIPROFLOXACIN IN D5W 400 MG/200ML IV SOLN
400.0000 mg | Freq: Two times a day (BID) | INTRAVENOUS | Status: AC
  Administered 2013-10-27: 400 mg via INTRAVENOUS

## 2013-10-27 MED ORDER — SODIUM BICARBONATE 4 % IV SOLN
INTRAVENOUS | Status: AC
  Filled 2013-10-27: qty 5

## 2013-10-27 MED ORDER — MIDAZOLAM HCL 2 MG/ML PO SYRP: 12.0000 mg | ORAL_SOLUTION | Freq: Once | ORAL | Status: AC | PRN

## 2013-10-27 MED ORDER — 0.9 % SODIUM CHLORIDE (POUR BTL) OPTIME
TOPICAL | Status: DC | PRN
  Administered 2013-10-27: 300 mL

## 2013-10-27 MED ORDER — MIDAZOLAM HCL 2 MG/2ML IJ SOLN: 1.0000 mg | INTRAMUSCULAR | Status: DC | PRN

## 2013-10-27 MED ORDER — HYDROMORPHONE HCL PF 1 MG/ML IJ SOLN
INTRAMUSCULAR | Status: AC
  Filled 2013-10-27: qty 1

## 2013-10-27 MED ORDER — CIPROFLOXACIN IN D5W 400 MG/200ML IV SOLN
INTRAVENOUS | Status: AC
  Filled 2013-10-27: qty 200

## 2013-10-27 MED ORDER — PROMETHAZINE HCL 25 MG/ML IJ SOLN
INTRAMUSCULAR | Status: AC
  Filled 2013-10-27: qty 1

## 2013-10-27 MED ORDER — DIPHENHYDRAMINE HCL 50 MG/ML IJ SOLN
INTRAMUSCULAR | Status: AC
  Filled 2013-10-27: qty 1

## 2013-10-27 MED ORDER — LACTATED RINGERS IV SOLN
INTRAVENOUS | Status: DC
  Administered 2013-10-27 (×2): 20 mL/h via INTRAVENOUS
  Administered 2013-10-27: 13:00:00 via INTRAVENOUS

## 2013-10-27 MED ORDER — CIPROFLOXACIN IN D5W 400 MG/200ML IV SOLN
400.0000 mg | Freq: Once | INTRAVENOUS | Status: AC
  Administered 2013-10-27: 400 mg via INTRAVENOUS

## 2013-10-27 MED ORDER — MIDAZOLAM HCL 5 MG/5ML IJ SOLN
INTRAMUSCULAR | Status: DC | PRN
  Administered 2013-10-27: 2 mg via INTRAVENOUS

## 2013-10-27 MED ORDER — LIDOCAINE HCL (CARDIAC) 20 MG/ML IV SOLN
INTRAVENOUS | Status: DC | PRN
  Administered 2013-10-27: 100 mg via INTRAVENOUS

## 2013-10-27 MED ORDER — ONDANSETRON HCL 4 MG/2ML IJ SOLN
INTRAMUSCULAR | Status: DC | PRN
  Administered 2013-10-27: 4 mg via INTRAVENOUS

## 2013-10-27 MED ORDER — EPINEPHRINE HCL 1 MG/ML IJ SOLN
INTRAMUSCULAR | Status: AC
  Filled 2013-10-27: qty 1

## 2013-10-27 MED ORDER — SCOPOLAMINE 1 MG/3DAYS TD PT72
1.0000 | MEDICATED_PATCH | Freq: Once | TRANSDERMAL | Status: DC
  Administered 2013-10-27: 1.5 mg via TRANSDERMAL

## 2013-10-27 MED ORDER — HYDROCODONE-ACETAMINOPHEN 5-325 MG PO TABS
1.0000 | ORAL_TABLET | ORAL | Status: DC | PRN
  Administered 2013-10-27 – 2013-10-28 (×2): 1 via ORAL

## 2013-10-27 MED ORDER — MIDAZOLAM HCL 2 MG/2ML IJ SOLN
INTRAMUSCULAR | Status: AC
  Filled 2013-10-27: qty 2

## 2013-10-27 MED ORDER — FENTANYL CITRATE 0.05 MG/ML IJ SOLN
INTRAMUSCULAR | Status: DC | PRN
  Administered 2013-10-27: 100 ug via INTRAVENOUS
  Administered 2013-10-27: 50 ug via INTRAVENOUS
  Administered 2013-10-27 (×2): 100 ug via INTRAVENOUS
  Administered 2013-10-27: 50 ug via INTRAVENOUS

## 2013-10-27 MED ORDER — PROMETHAZINE HCL 25 MG/ML IJ SOLN
6.2500 mg | INTRAMUSCULAR | Status: DC | PRN
  Administered 2013-10-27: 6.25 mg via INTRAVENOUS

## 2013-10-27 MED ORDER — MORPHINE SULFATE 4 MG/ML IJ SOLN: 8.0000 mg | INTRAMUSCULAR | Status: DC | PRN

## 2013-10-27 MED ORDER — FENTANYL CITRATE 0.05 MG/ML IJ SOLN: 50.0000 ug | INTRAMUSCULAR | Status: DC | PRN

## 2013-10-27 MED ORDER — FENTANYL CITRATE 0.05 MG/ML IJ SOLN
INTRAMUSCULAR | Status: AC
  Filled 2013-10-27: qty 8

## 2013-10-27 MED ORDER — DEXAMETHASONE SODIUM PHOSPHATE 4 MG/ML IJ SOLN
INTRAMUSCULAR | Status: DC | PRN
  Administered 2013-10-27: 10 mg via INTRAVENOUS

## 2013-10-27 MED ORDER — LIDOCAINE HCL (PF) 1 % IJ SOLN
INTRAMUSCULAR | Status: AC
  Filled 2013-10-27: qty 30

## 2013-10-27 MED ORDER — DEXTROSE IN LACTATED RINGERS 5 % IV SOLN: 125.0000 mL/h | INTRAVENOUS | Status: DC

## 2013-10-27 MED ORDER — OXYCODONE HCL 5 MG/5ML PO SOLN: 5.0000 mg | Freq: Once | ORAL | Status: AC | PRN

## 2013-10-27 MED ORDER — MORPHINE SULFATE 10 MG/ML IJ SOLN
INTRAMUSCULAR | Status: AC
  Filled 2013-10-27: qty 1

## 2013-10-27 MED ORDER — PROPOFOL 10 MG/ML IV BOLUS
INTRAVENOUS | Status: DC | PRN
  Administered 2013-10-27: 200 mg via INTRAVENOUS

## 2013-10-27 MED ORDER — DIPHENHYDRAMINE HCL 25 MG PO CAPS
ORAL_CAPSULE | ORAL | Status: AC
  Filled 2013-10-27: qty 1

## 2013-10-27 MED ORDER — DIPHENHYDRAMINE HCL 25 MG PO CAPS
25.0000 mg | ORAL_CAPSULE | Freq: Four times a day (QID) | ORAL | Status: DC | PRN
  Administered 2013-10-27: 25 mg via ORAL

## 2013-10-27 MED ORDER — PROMETHAZINE HCL 25 MG/ML IJ SOLN
12.5000 mg | INTRAMUSCULAR | Status: DC | PRN
Start: 2013-10-27 — End: 2013-10-28

## 2013-10-27 MED ORDER — SUCCINYLCHOLINE CHLORIDE 20 MG/ML IJ SOLN
INTRAMUSCULAR | Status: DC | PRN
  Administered 2013-10-27: 100 mg via INTRAVENOUS

## 2013-10-27 MED ORDER — SCOPOLAMINE 1 MG/3DAYS TD PT72
MEDICATED_PATCH | TRANSDERMAL | Status: AC
  Filled 2013-10-27: qty 1

## 2013-10-27 MED ORDER — OXYCODONE HCL 5 MG PO TABS: 5.0000 mg | ORAL_TABLET | Freq: Once | ORAL | Status: AC | PRN

## 2013-10-27 MED ORDER — HYDROCODONE-ACETAMINOPHEN 5-325 MG PO TABS
ORAL_TABLET | ORAL | Status: AC
  Filled 2013-10-27: qty 1

## 2013-10-27 MED ORDER — DIPHENHYDRAMINE HCL 50 MG/ML IJ SOLN
12.5000 mg | Freq: Once | INTRAMUSCULAR | Status: AC
  Administered 2013-10-27: 12.5 mg via INTRAVENOUS

## 2013-10-27 SURGICAL SUPPLY — 45 items
BALL CTTN LRG ABS STRL LF (GAUZE/BANDAGES/DRESSINGS)
BANDAGE ELASTIC 6 VELCRO ST LF (GAUZE/BANDAGES/DRESSINGS) ×4 IMPLANT
BINDER BREAST XLRG (GAUZE/BANDAGES/DRESSINGS) ×1 IMPLANT
BLADE SURG 10 STRL SS (BLADE) ×4 IMPLANT
BLADE SURG 15 STRL LF DISP TIS (BLADE) ×1 IMPLANT
BLADE SURG 15 STRL SS (BLADE) ×2
CANISTER SUCT 1200ML W/VALVE (MISCELLANEOUS) ×2 IMPLANT
CHLORAPREP W/TINT 26ML (MISCELLANEOUS) IMPLANT
COTTONBALL LRG STERILE PKG (GAUZE/BANDAGES/DRESSINGS) IMPLANT
COVER MAYO STAND STRL (DRAPES) ×2 IMPLANT
COVER TABLE BACK 60X90 (DRAPES) ×2 IMPLANT
DRAPE LAPAROSCOPIC ABDOMINAL (DRAPES) ×2 IMPLANT
DRSG PAD ABDOMINAL 8X10 ST (GAUZE/BANDAGES/DRESSINGS) ×8 IMPLANT
ELECT REM PT RETURN 9FT ADLT (ELECTROSURGICAL) ×2
ELECTRODE REM PT RTRN 9FT ADLT (ELECTROSURGICAL) ×1 IMPLANT
FILTER 7/8 IN (FILTER) ×1 IMPLANT
GAUZE SPONGE 4X4 12PLY STRL LF (GAUZE/BANDAGES/DRESSINGS) IMPLANT
GAUZE XEROFORM 5X9 LF (GAUZE/BANDAGES/DRESSINGS) ×4 IMPLANT
GLOVE BIO SURGEON STRL SZ 6.5 (GLOVE) ×1 IMPLANT
GLOVE BIO SURGEON STRL SZ7.5 (GLOVE) IMPLANT
GLOVE BIO SURGEON STRL SZ8 (GLOVE) ×2 IMPLANT
GLOVE BIOGEL PI IND STRL 8 (GLOVE) ×1 IMPLANT
GLOVE BIOGEL PI INDICATOR 8 (GLOVE) ×1
GLOVE SURG ORTHO 8.0 STRL STRW (GLOVE) IMPLANT
GLOVE SURG SS PI 7.0 STRL IVOR (GLOVE) ×1 IMPLANT
GOWN PREVENTION PLUS XLARGE (GOWN DISPOSABLE) ×4 IMPLANT
NS IRRIG 1000ML POUR BTL (IV SOLUTION) ×2 IMPLANT
PACK BASIN DAY SURGERY FS (CUSTOM PROCEDURE TRAY) ×2 IMPLANT
PAD EYE OVAL STERILE LF (GAUZE/BANDAGES/DRESSINGS) IMPLANT
SLEEVE SCD COMPRESS KNEE MED (MISCELLANEOUS) ×1 IMPLANT
SPECIMEN JAR X LARGE (MISCELLANEOUS) ×4 IMPLANT
SPONGE GAUZE 4X4 12PLY (GAUZE/BANDAGES/DRESSINGS) ×4 IMPLANT
SPONGE LAP 18X18 X RAY DECT (DISPOSABLE) ×8 IMPLANT
STRIP CLOSURE SKIN 1/2X4 (GAUZE/BANDAGES/DRESSINGS) ×8 IMPLANT
SUT MNCRL AB 3-0 PS2 18 (SUTURE) ×16 IMPLANT
SUT MNCRL AB 4-0 PS2 18 (SUTURE) ×8 IMPLANT
SUT VIC AB 2-0 PS2 27 (SUTURE) ×3 IMPLANT
SYR BULB IRRIGATION 50ML (SYRINGE) ×4 IMPLANT
TOWEL OR 17X24 6PK STRL BLUE (TOWEL DISPOSABLE) ×6 IMPLANT
TRAY DSU PREP LF (CUSTOM PROCEDURE TRAY) ×1 IMPLANT
TRAY FOLEY CATH 14FR (SET/KITS/TRAYS/PACK) IMPLANT
TUBE CONNECTING 20X1/4 (TUBING) ×2 IMPLANT
UNDERPAD 30X30 INCONTINENT (UNDERPADS AND DIAPERS) ×3 IMPLANT
VAC PENCILS W/TUBING CLEAR (MISCELLANEOUS) ×2 IMPLANT
YANKAUER SUCT BULB TIP NO VENT (SUCTIONS) ×2 IMPLANT

## 2013-10-27 NOTE — H&P (Signed)
PREOPERATIVE H&P  Chief Complaint: breast hyperplasia  HPI: Margaret [name] is a 37 y.o. female who presents for preoperative history and physical with a diagnosis of breast hyperplasia. Symptoms are rated as moderate to severe, and have been worsening.  This is significantly impairing activities of daily living.  She has elected for surgical management.   Past Medical History  Diagnosis Date  . Obesity   . Anxiety   . Headache(784.0)     due to Lamictal  . Low serum iron   . Constipation   . GERD (gastroesophageal reflux disease)   . Seasonal allergies   . Complication of anesthesia     itching  . PONV (postoperative nausea and vomiting)   . Difficulty swallowing pills   . Carpal tunnel syndrome on both sides   . Breast hypertrophy 09/2013   Past Surgical History  Procedure Laterality Date  . Cesarean section  04/23/2002    twins  . Dilation and curettage of uterus    . Cesarean section    . Lasik     History   Social History  . Marital Status: Married    Spouse Name: N/A    Number of Children: N/A  . Years of Education: N/A   Social History Main Topics  . Smoking status: Never Smoker   . Smokeless tobacco: Never Used  . Alcohol Use: Yes     Comment: occasionally  . Drug Use: No  . Sexual Activity: None   Other Topics Concern  . None   Social History Narrative  . None   Family History  Problem Relation Age of Onset  . Cancer Other   . COPD Other   . Hypertension Other   . Hyperlipidemia Other   . Diabetes Other    Allergies  Allergen Reactions  . Amoxicillin Hives and Swelling  . Lamictal [Lamotrigine] Other (See Comments)    HEADACHES - IS ADJUSTING DOSAGE  . Penicillins Hives and Swelling  . Adhesive [Tape] Other (See Comments)    SKIN IRRITATION   Prior to Admission medications   Medication Sig Start Date End Date Taking? Authorizing Provider  Biotin 5000 MCG CAPS Take by mouth.   Yes Historical Provider, MD  calcium carbonate (TUMS -  DOSED IN MG ELEMENTAL CALCIUM) 500 MG chewable tablet Chew 1 tablet by mouth daily.   Yes Historical Provider, MD  docusate sodium (COLACE) 100 MG capsule Take 1 capsule (100 mg total) by mouth daily. While on iron. 10/07/13  Yes Lennis Buzzy Han, MD  Ferrous Fumarate 324 MG TABS Take 1 tab daily to twice a day on empty stomach with OJ or Vit.-C 10/07/13  Yes Lennis Buzzy Han, MD  Ibuprofen 200 MG CAPS Take 3 capsules by mouth every three (3) days as needed.   Yes Historical Provider, MD  lamoTRIgine (LAMICTAL) 200 MG tablet Take 100 mg by mouth 2 (two) times daily.    Yes Historical Provider, MD  Pyridoxine HCl (B-6 PO) Take by mouth.   Yes Historical Provider, MD  vitamin C (ASCORBIC ACID) 250 MG tablet Take 250 mg by mouth daily.   Yes Historical Provider, MD  albuterol (PROVENTIL HFA;VENTOLIN HFA) 108 (90 BASE) MCG/ACT inhaler Inhale 2 puffs into the lungs every 6 (six) hours as needed for wheezing or shortness of breath.    Historical Provider, MD  ALPRAZolam (XANAX XR) 0.5 MG 24 hr tablet Take 0.5 mg by mouth every 6 (six) hours as needed for anxiety.    Historical Provider,  MD  diazepam (VALIUM) 5 MG tablet Take 5 mg by mouth every 12 (twelve) hours as needed for anxiety.    Historical Provider, MD  zolpidem (AMBIEN) 10 MG tablet Take 5 mg by mouth at bedtime as needed for sleep.    Historical Provider, MD     Positive ROS: All other systems have been reviewed and were otherwise negative with the exception of those mentioned in the HPI and as above.  Physical Exam: General: Alert, no acute distress Cardiovascular: No pedal edema Respiratory: No cyanosis, no use of accessory musculature GI: No organomegaly, abdomen is soft and non-tender Skin: No lesions in the area of chief complaint Neurologic: Sensation intact distally Psychiatric: Patient is competent for consent with normal mood and affect Lymphatic: No axillary or cervical lymphadenopatthy Breast: significant bilateral mammary  hypertrophy with no discreet breast masses and no axillary or cervical adenopathy  Assessment: breast hyperplasia  Plan: Plan for Procedure(s): MAMMARY REDUCTION  (BREAST) BILATERAL  The risks benefits and alternatives were discussed with the patient including but not limited to the risks of nonoperative treatment, versus surgical intervention including infection, bleeding, nerve injury,  blood clots, cardiopulmonary complications, morbidity, mortality, among others, and they were willing to proceed.   Pleas Patricia, MD [phone]   10/27/2013 10:03 AM

## 2013-10-27 NOTE — Brief Op Note (Signed)
10/27/2013  3:07 PM  PATIENT:  Margaret [name]  37 y.o. female  PRE-OPERATIVE DIAGNOSIS:  Bilateral Breast Hyperplasia  POST-OPERATIVE DIAGNOSIS:  Bilateral Breast Hyperplasia  PROCEDURE:  Procedure(s): MAMMARY REDUCTION  (BREAST) BILATERAL (Bilateral)  SURGEON:  Surgeon(s) and Role:    * Pleas Patricia, MD - Primary  PHYSICIAN ASSISTANT:   ASSISTANTS:  Bonita Cox, RNFA  ANESTHESIA:   general  EBL:  Total I/O In: 1800 [I.V.:1800] Out: 300 [Blood:300]  BLOOD ADMINISTERED:none  DRAINS: none   LOCAL MEDICATIONS USED:  NONE  SPECIMEN:  Biopsy / Limited Resection  DISPOSITION OF SPECIMEN:  PATHOLOGY  COUNTS:  YES  TOURNIQUET:  * No tourniquets in log *  DICTATION: .Other Dictation: Dictation Number [id number]  PLAN OF CARE: Admit for overnight observation  PATIENT DISPOSITION:  PACU - hemodynamically stable.   Delay start of Pharmacological VTE agent (>24hrs) due to surgical blood loss or risk of bleeding: not applicable

## 2013-10-27 NOTE — Anesthesia Postprocedure Evaluation (Signed)
  Anesthesia Post-op Note  Patient: Margaret [name]  Procedure(s) Performed: Procedure(s): MAMMARY REDUCTION  (BREAST) BILATERAL (Bilateral)  Patient Location: PACU  Anesthesia Type:General  Level of Consciousness: awake and alert   Airway and Oxygen Therapy: Patient Spontanous Breathing  Post-op Pain: mild  Post-op Assessment: Post-op Vital signs reviewed, Patient's Cardiovascular Status Stable, Respiratory Function Stable, Patent Airway, No signs of Nausea or vomiting and Pain level controlled  Post-op Vital Signs: Reviewed and stable  Complications: No apparent anesthesia complications

## 2013-10-27 NOTE — Transfer of Care (Signed)
Immediate Anesthesia Transfer of Care Note  Patient: Margaret [name]  Procedure(s) Performed: Procedure(s): MAMMARY REDUCTION  (BREAST) BILATERAL (Bilateral)  Patient Location: PACU  Anesthesia Type:General  Level of Consciousness: awake and alert   Airway & Oxygen Therapy: Patient Spontanous Breathing and Patient connected to face mask oxygen  Post-op Assessment: Report given to PACU RN and Post -op Vital signs reviewed and stable  Post vital signs: Reviewed and stable  Complications: No apparent anesthesia complications

## 2013-10-27 NOTE — Anesthesia Procedure Notes (Signed)
Procedure Name: Intubation Date/Time: 10/27/2013 12:16 PM Performed by: Caren Macadam Pre-anesthesia Checklist: Patient identified, Emergency Drugs available, Suction available and Patient being monitored Patient Re-evaluated:Patient Re-evaluated prior to inductionOxygen Delivery Method: Circle System Utilized Preoxygenation: Pre-oxygenation with 100% oxygen Intubation Type: IV induction Ventilation: Mask ventilation without difficulty Laryngoscope Size: Miller and 2 Grade View: Grade I Tube type: Oral Tube size: 7.0 mm Number of attempts: 1 Airway Equipment and Method: stylet and oral airway Placement Confirmation: ETT inserted through vocal cords under direct vision,  positive ETCO2 and breath sounds checked- equal and bilateral Secured at: 22 cm Tube secured with: Tape Dental Injury: Teeth and Oropharynx as per pre-operative assessment

## 2013-10-27 NOTE — Anesthesia Preprocedure Evaluation (Signed)
Anesthesia Evaluation  Patient identified by MRN, date of birth, ID band Patient awake    Reviewed: Allergy & Precautions, H&P , NPO status , Patient's Chart, lab work & pertinent test results  History of Anesthesia Complications (+) PONV  Airway Mallampati: II TM Distance: >3 FB Neck ROM: Full    Dental   Pulmonary  breath sounds clear to auscultation        Cardiovascular Rhythm:Regular Rate:Normal     Neuro/Psych  Headaches,    GI/Hepatic GERD-  Medicated and Controlled,  Endo/Other    Renal/GU      Musculoskeletal   Abdominal (+) + obese,   Peds  Hematology   Anesthesia Other Findings   Reproductive/Obstetrics                           Anesthesia Physical Anesthesia Plan  ASA: II  Anesthesia Plan: General   Post-op Pain Management:    Induction: Intravenous  Airway Management Planned: Oral ETT  Additional Equipment:   Intra-op Plan:   Post-operative Plan: Extubation in OR  Informed Consent: I have reviewed the patients History and Physical, chart, labs and discussed the procedure including the risks, benefits and alternatives for the proposed anesthesia with the patient or authorized representative who has indicated his/her understanding and acceptance.     Plan Discussed with: CRNA and Surgeon  Anesthesia Plan Comments:         Anesthesia Quick Evaluation

## 2013-10-27 NOTE — Op Note (Signed)
[id number] 

## 2013-10-28 MED ORDER — HYDROCODONE-ACETAMINOPHEN 5-325 MG PO TABS
ORAL_TABLET | ORAL | Status: AC
  Filled 2013-10-28: qty 1

## 2013-10-28 NOTE — Op Note (Unsigned)
Margaret [name], Margaret [name]                ACCOUNT NO.:  [id number]  MEDICAL RECORD NO.:  [id number]  LOCATION:  [facility]                          FACILITY:  [facility]  PHYSICIAN:  Consuello Bossier., M.D.DATE OF BIRTH:  February 07, 1976  DATE OF PROCEDURE:  10/27/2013 DATE OF DISCHARGE:  10/08/2013                              OPERATIVE REPORT   PREOPERATIVE DIAGNOSIS:  Symptomatic bilateral mammary hypertrophy.  POSTOPERATIVE DIAGNOSIS:  Symptomatic bilateral mammary hypertrophy.  OPERATION:  Bilateral reduction mammoplasty.  SURGEON:  Pleas Patricia, Montez Hageman. M.D.  ASSISTANT:  Enedina Finner, RNFA.  ANESTHESIA:  General endotracheal.  FINDINGS:  The patient had significant bilateral mammary hypertrophy with discomfort in her chest, upper shoulder, and back areas for which the above surgical procedure was carried out.  PROCEDURE IN DETAIL:  The patient was brought to the operating room, having been marked in the upright position for the planned surgical procedure to elevate the nipple to 24 cm from the sternal notch.  She was prepped with Betadine and draped sterilely after being given general endotracheal anesthetic.  Following the onset of anesthesia, the keyhole area as well as the inferior pedicle were de-epithelialized.  An incision was made medially and then up along the planned vertical bipedicle nipple areolar graft.  A similar procedure was carried out laterally creating this vertical bipedicle graft.  Bleeding was controlled with electrocautery and there was noted to be good hemostasis.  Following this, the incision was made in the central pedicle from just above the nipple area to the new nipple location.  An approximately 1 cm depth superior pedicle was maintained.  The lateral triangular segment of full-thickness breast tissue was removed in continuity with the central segment and then even larger lateral triangular segment was removed.  The total amounts were 947 g of tissue from  the right breast and 951 g from the left breast.  The wound was irrigated with normal saline.  There was noted to be good hemostasis. The medial and lateral flaps were brought together to a predetermined position along the inframammary line with an interrupted #2-0 Vicryl. The upper part of the vertical incision also closed with interrupted #2- 0 Vicryl.  Following this, the nipple was inset with interrupted subcutaneous #3-0 Monocryl as well as the vertical and inframammary incisions also similarly closed with subcutaneous #3-0 Monocryl.  The remainder of the closure was a continuous #4-0 Monocryl placed in the circumareolar, vertical, and inframammary incisions.  The nipple color was excellent.  The breasts appeared to be symmetrical.  Steri-Strips, Xeroform, fluffs, ABD, and cervicothoracic Ace bandage were applied.  The patient tolerated the procedure well, was able to be discharged from the operating room to the recovery room and subsequently to be admitted to the [facility] for overnight observation.     Consuello Bossier., M.D.     HH/MEDQ  D:  10/27/2013  T:  10/28/2013  Job:  [id number]

## 2013-10-29 ENCOUNTER — Encounter (HOSPITAL_BASED_OUTPATIENT_CLINIC_OR_DEPARTMENT_OTHER): Payer: Self-pay | Admitting: Plastic Surgery

## 2013-11-29 ENCOUNTER — Other Ambulatory Visit: Payer: Self-pay | Admitting: Oncology

## 2013-11-30 ENCOUNTER — Telehealth: Payer: Self-pay | Admitting: *Deleted

## 2013-11-30 ENCOUNTER — Encounter: Payer: Self-pay | Admitting: Oncology

## 2013-11-30 ENCOUNTER — Ambulatory Visit (HOSPITAL_BASED_OUTPATIENT_CLINIC_OR_DEPARTMENT_OTHER): Payer: 59 | Admitting: Oncology

## 2013-11-30 ENCOUNTER — Other Ambulatory Visit (HOSPITAL_BASED_OUTPATIENT_CLINIC_OR_DEPARTMENT_OTHER): Payer: 59

## 2013-11-30 VITALS — BP 125/67 | HR 80 | Temp 98.5°F | Resp 16 | Wt 209.5 lb

## 2013-11-30 DIAGNOSIS — R5381 Other malaise: Secondary | ICD-10-CM

## 2013-11-30 DIAGNOSIS — R5383 Other fatigue: Secondary | ICD-10-CM

## 2013-11-30 DIAGNOSIS — D500 Iron deficiency anemia secondary to blood loss (chronic): Secondary | ICD-10-CM

## 2013-11-30 DIAGNOSIS — E669 Obesity, unspecified: Secondary | ICD-10-CM

## 2013-11-30 DIAGNOSIS — N920 Excessive and frequent menstruation with regular cycle: Secondary | ICD-10-CM

## 2013-11-30 LAB — CBC WITH DIFFERENTIAL/PLATELET
BASO%: 0.6 % (ref 0.0–2.0)
Basophils Absolute: 0 10*3/uL (ref 0.0–0.1)
EOS ABS: 0.2 10*3/uL (ref 0.0–0.5)
EOS%: 3.1 % (ref 0.0–7.0)
HEMATOCRIT: 34.9 % (ref 34.8–46.6)
HGB: 11.5 g/dL — ABNORMAL LOW (ref 11.6–15.9)
LYMPH%: 39.5 % (ref 14.0–49.7)
MCH: 27.6 pg (ref 25.1–34.0)
MCHC: 32.9 g/dL (ref 31.5–36.0)
MCV: 84.1 fL (ref 79.5–101.0)
MONO#: 0.4 10*3/uL (ref 0.1–0.9)
MONO%: 5.8 % (ref 0.0–14.0)
NEUT%: 51 % (ref 38.4–76.8)
NEUTROS ABS: 3.7 10*3/uL (ref 1.5–6.5)
PLATELETS: 329 10*3/uL (ref 145–400)
RBC: 4.15 10*6/uL (ref 3.70–5.45)
RDW: 14.9 % — ABNORMAL HIGH (ref 11.2–14.5)
WBC: 7.2 10*3/uL (ref 3.9–10.3)
lymph#: 2.9 10*3/uL (ref 0.9–3.3)

## 2013-11-30 LAB — IRON AND TIBC CHCC
%SAT: 20 % — AB (ref 21–57)
Iron: 72 ug/dL (ref 41–142)
TIBC: 368 ug/dL (ref 236–444)
UIBC: 296 ug/dL (ref 120–384)

## 2013-11-30 LAB — FERRITIN CHCC: FERRITIN: 15 ng/mL (ref 9–269)

## 2013-11-30 NOTE — Progress Notes (Signed)
OFFICE PROGRESS NOTE   11/30/2013   Physicians:Gates, Butch Penny,  H.Holderness, J.McComb, C.Gessner   INTERVAL HISTORY:  Patient is seen, alone for visit, in follow up of iron deficiency anemia related to heavy menses, now having had bilateral breast reduction surgery by Dr Dessie Coma on 10-27-13. She is improving well from that surgery, is to see Dr Dessie Coma again later today. 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 She has been on Keflex (intolerant) and now clindamycin for draining area at inferior right breast, which is improved. GI symptoms with ferrous fumarate preceded antibiotics.  She took oral ferrous fumarate fairly regularly from Nov until surgery, with increase in hemoglobin to 12.3 by day of surgery, but she has not taken it very regularly/ none in last few days since surgery due to increased GI upset with this preparation also. She has taken the ferrous fumarate with Vit C, but has also had to take it with food + 2 Tums + colace. Discomfort from that medication is described as burning epigastric symptoms, tho has not been tender to touch in epigastrium.  LMP was ~ Dec 7, heavy x 2 days requiring super tampons + pads; she has history of some spotting between menses. She is to see Dr Radene Knee on Jan 20 for regular visit.  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  Pathology from breast reduction no malignancy (see below)   HISTORY from consultatio 10-07-2013 Patient has history of anemia, tho only prior CBC available now is from Dr Darcus Austin 09-03-13 with WBC 4.0, Hgb 11.8, MCV 82.6 and plt 291k, with serum iron 50, %sat 13 and ferritin 7.8. BMET from same date was normal including creatinine 0.74. Patient reports heavy menses lasting 5-6 days, then spotting ~ every other week between periods; endometrial biopsy by Dr Radene Knee [phone] had benign  proliferative endometrium. She has not seen other bleeding. She has tolerated OTC iron poorly in past, mostly with constipation. She has had upper endoscopy by Dr Carlean Purl, that report not available to me but apparently not remarkable. She complains of being tired all the time, but does work night shifts as Therapist, sports in [employer] and probably does not sleep adequately during days as she has 3 young sons. She does not have frank SOB and no cardiac type chest pain.     Review of systems as above, also: No fever or symptoms of infection. Feels more rested with more regular day/ night schedule. Draining area inferior to right breast after recent surgery improved.Little discomfort from breast surgery.  Remainder of 10 point Review of Systems negative.  Objective:  Vital signs in last 24 hours:  BP 125/67  Pulse 80  Temp(Src) 98.5 F (36.9 C)  Resp 16  Wt 209 lb 8 oz (95.029 kg)  Alert, oriented and appropriate. Easily mobile. Looks more comfortable than at first meeting.   HEENT:PERRL, sclerae not icteric. Oral mucosa moist without lesions, posterior pharynx clear.   Lymphatics:no cervical,suraclavicular adenopathy Resp: clear to auscultation bilaterally and normal percussion bilaterally Cardio: regular rate and rhythm.  GI: soft, nontender, not distended, no organomegaly. Normally active bowel sounds. Musculoskeletal/ Extremities: without pitting edema, cords, tenderness Neuro: nonfocal Skin without rash, ecchymosis, petechiae Breasts: left reduction mammoplasty incision inferiorly with superficial, clean, 0.5 mm area open, without drainage or swelling.  Lab Results:  Results for orders placed  in visit on 11/30/13  CBC WITH DIFFERENTIAL      Result Value Range   WBC 7.2  3.9 - 10.3 10e3/uL   NEUT# 3.7  1.5 - 6.5 10e3/uL   HGB 11.5 (*) 11.6 - 15.9 g/dL   HCT 34.9  34.8 - 46.6 %   Platelets 329  145 - 400 10e3/uL   MCV 84.1  79.5 - 101.0 fL   MCH 27.6  25.1 - 34.0 pg   MCHC 32.9  31.5 -  36.0 g/dL   RBC 4.15  3.70 - 5.45 10e6/uL   RDW 14.9 (*) 11.2 - 14.5 %   lymph# 2.9  0.9 - 3.3 10e3/uL   MONO# 0.4  0.1 - 0.9 10e3/uL   Eosinophils Absolute 0.2  0.0 - 0.5 10e3/uL   Basophils Absolute 0.0  0.0 - 0.1 10e3/uL   NEUT% 51.0  38.4 - 76.8 %   LYMPH% 39.5  14.0 - 49.7 %   MONO% 5.8  0.0 - 14.0 %   EOS% 3.1  0.0 - 7.0 %   BASO% 0.6  0.0 - 2.0 %    Iron studies repeated today and available after visit: serum iron 72 (without oral iron in at least several days per patient), up from 44 on 10-07-13, and %sat up to 20 from 12 on 10-07-13, with ferritin some better at 15 now as compared with 10 in Nov.  Studies/Results:  Pathology Patient: Margaret [name], Margaret [name] Collected: 10/27/2013 Accession: [id number] REPORT OF SURGICAL PATHOLOFINAL DIAGNOSIS Diagnosis 1. Breast, Mammoplasty, Right - BENIGN BREAST PARENCHYMA. - THERE IS NO EVIDENCE OF MALIGNANCY. 2. Breast, Mammoplasty, Left - BENIGN BREAST PARENCHYMA. - THERE IS NO EVIDENCE OF MALIGNANCY.   Medications: I have reviewed the patient's current medications. I have encouraged her to continue the oral ferrous fumarate at least a couple of times weekly; we have again discussed best absorption when used on empty stomach if possible. With results of iron studies available after visit showing some improvement even on amount of oral iron she has taken, and with hemoglobin and MCV in reasonable range, we will be in touch with her to recommend continuing oral ferrous fumarate as above. IV iron preparations, while certainly better than in years past, can still have side effects in some patients and I do not feel this is necessary in her now.  DISCUSSION: She will see Dr Radene Knee later this month; next yearly visit with Dr Inda Merlin ~ Nov. I have offered recheck of CBC and iron studies here in ~ 6 months if she would like, tho that could be done by other MDs if more convenient or with Dr Inda Merlin' visit in Nov if she is symptomatically doing well in  interim.  Assessment/Plan:  1.iron deficiency which appears related to chronic menstrual blood loss: improvement even with some limited oral ferrous fumarate, which hopefully she can continue at least on occasional basis as above 2.post bilateral reduction mammoplasties 10-27-13 due in part to chronic neck/upper back symptoms causing HAs.Appears to be healing well and related symptoms improved 3.fatigue: multifactorial, improved since she has had some rest last few weeks. Hopefully day shift work will be helpful. 4.gestational DM 5.obesity    Patient aware at visit that we will be in touch with her with iron results. She is in agreement with plan.  Note routed now to Drs Inda Merlin and Marlyne Beards, MD   11/30/2013, 10:16 AM

## 2013-11-30 NOTE — Patient Instructions (Signed)
Ok to have Dr Radene Knee recheck hemoglobin later this month, especially if heavy period prior to that. We will let you know iron results from today when available.

## 2013-11-30 NOTE — Telephone Encounter (Signed)
Pt notified of results and directions below.

## 2013-11-30 NOTE — Telephone Encounter (Signed)
Pt is aware that i printed the pof and placed it in [facility] file to schedule hjer for 05/2014....td

## 2013-11-30 NOTE — Telephone Encounter (Signed)
Message copied by Patton Salles on Mon Nov 30, 2013  1:34 PM ------      Message from: Evlyn Clines P      Created: Mon Nov 30, 2013  1:23 PM       Labs seen and need follow up: please let her know results of iron and ferritin as above, actually some better even with the amount of ferrous fumarate she has been able to take. I would suggest continuing to try the ferrous fumarate 2 or 3x weekly as she is able, but I am pleased to see a little improvement ------

## 2014-01-05 ENCOUNTER — Encounter: Payer: 59 | Attending: Family Medicine | Admitting: Dietician

## 2014-01-05 ENCOUNTER — Ambulatory Visit: Payer: 59 | Admitting: Dietician

## 2014-01-05 VITALS — Ht 63.0 in | Wt 194.2 lb

## 2014-01-05 DIAGNOSIS — Z713 Dietary counseling and surveillance: Secondary | ICD-10-CM | POA: Insufficient documentation

## 2014-01-05 DIAGNOSIS — E669 Obesity, unspecified: Secondary | ICD-10-CM | POA: Insufficient documentation

## 2014-01-05 NOTE — Progress Notes (Signed)
  Medical Nutrition Therapy:  Appt start time: 1200 end time:  1230.  Assessment:  Primary concerns today: [name] is here today for a follow up for weight loss. Lost about 7 lbs since last visit in November. Started doing [place] again after the holidays in early January (lost about 1-2 lbs since 12/04/13). Had breast reduction surgery on 10/27/13 and has some edema and inactivity while recovering. Back to exercising and walks at least 10,000 steps per day (measures with a fit bit). Would like to work up to running.   Started working days since last visit. Feels like she has been following diet well since early January though may go over the carbs at evening meal.  Interested in finding desserts that work for her family and her diet. Weight loss goal is 180 lbs.    Wt Readings from Last 3 Encounters:  01/05/14 194 lb 3.2 oz (88.089 kg)  11/30/13 209 lb 8 oz (95.029 kg)  10/19/13 198 lb (89.812 kg)   Ht Readings from Last 3 Encounters:  01/05/14 5\' 3"  (1.6 m)  10/19/13 5\' 3"  (1.6 m)  10/19/13 5\' 3"  (1.6 m)   Body mass index is 34.41 kg/(m^2). @BMIFA @ Normalized weight-for-age data available only for age 79 to 49 years. Normalized stature-for-age data available only for age 79 to 67 years.   Preferred Learning Style:   No preference indicated   Learning Readiness:   Ready  Change in progress   MEDICATIONS: See List   DIETARY INTAKE:  Usual eating pattern includes 2 meals and 2-3 snacks per day (when working).  Avoided foods include eggs, bacon, milk, cereal (serving too small)   24-hr recall:  B ( AM):Slim Fast or 2 pieces of raisin toast or Special K oatmeal  Snk ( AM): special k bars or yoplait yogurt Snk ( AM): cheese or slim jim or nutrition grain bar L ( PM): leftovers or club salad from [place] with chocolate chip cookie or ham and cheese sandwich or baked chips Snk ( PM):  cheese or slim jim or nutrition grain bar or almond packs  D ( PM): meat and vegetables and  macaroni and cheese  Snk ( PM): weight watchers ice cream or apple and peanut butter or "slim-a-bar" klondike bar Beverages:unsweet tea, water or diet soda  Usual physical activity: walking or treadmill 10,000 steps or 1-2 miles every day  Estimated energy needs: 1800 calories 200 g carbohydrates 135 g protein 50 g fat  Progress Towards Goal(s):  In progress.   Nutritional Diagnosis:  [place]-3.3 Overweight/obesity As related to history of energy dense food choices and inadequate physical activity.  As evidenced by BMI of 35.7.    Intervention:  Nutrition counseling provided. Encouraged her to have more vegetables and try to eat when she is hungry, especially at home.   Plan: Try to eat 3 meals and 2-3 snacks per day if possible. Pair carbohydrates with protein for snacks. Try to eat meals when you are hungry.  Continue drinking mostly water or unsweet tea. Aim to get 30 minutes of exercise per week (150 minutes per week). Fill half of your plate with vegetables and continue limiting starch to a quarter of your plate (30 g).  Teaching Method Utilized:  Visual Auditory   Barriers to learning/adherence to lifestyle change: very busy schedule, hard to fit in exercise and some meals  Demonstrated degree of understanding via:  Teach Back   Monitoring/Evaluation:  Dietary intake, exercise, and body weight prn.

## 2014-01-05 NOTE — Patient Instructions (Addendum)
Continue to eat 3 meals and 2-3 snacks per day if possible. Pair carbohydrates with protein for snacks. Continue drinking mostly water or unsweet tea. Continue to get 30 minutes + of exercise per week (150 minutes per week). Fill half of your plate with vegetables and continue limiting starch to a quarter of your plate (30 g).

## 2014-07-21 ENCOUNTER — Other Ambulatory Visit: Payer: Self-pay

## 2014-07-21 DIAGNOSIS — Z1231 Encounter for screening mammogram for malignant neoplasm of breast: Secondary | ICD-10-CM

## 2014-08-30 ENCOUNTER — Ambulatory Visit
Admission: RE | Admit: 2014-08-30 | Discharge: 2014-08-30 | Disposition: A | Discharge status: Home / Self Care | Payer: 59 | Source: Ambulatory Visit

## 2014-08-30 DIAGNOSIS — Z1231 Encounter for screening mammogram for malignant neoplasm of breast: Secondary | ICD-10-CM

## 2014-09-11 ENCOUNTER — Telehealth: Payer: Self-pay | Admitting: Oncology

## 2014-09-11 NOTE — Telephone Encounter (Signed)
, °

## 2014-09-19 ENCOUNTER — Other Ambulatory Visit: Payer: Self-pay | Admitting: Oncology

## 2014-09-21 ENCOUNTER — Telehealth: Payer: Self-pay | Admitting: Oncology

## 2014-09-21 NOTE — Telephone Encounter (Signed)
, °

## 2014-09-22 ENCOUNTER — Ambulatory Visit: Payer: 59 | Admitting: Oncology

## 2014-09-22 ENCOUNTER — Other Ambulatory Visit: Payer: 59

## 2014-09-24 ENCOUNTER — Encounter: Payer: Self-pay | Admitting: Cardiology

## 2014-10-10 ENCOUNTER — Other Ambulatory Visit: Payer: Self-pay | Admitting: Oncology

## 2014-10-13 ENCOUNTER — Telehealth: Payer: Self-pay

## 2014-10-13 ENCOUNTER — Other Ambulatory Visit (HOSPITAL_BASED_OUTPATIENT_CLINIC_OR_DEPARTMENT_OTHER): Payer: 59

## 2014-10-13 ENCOUNTER — Ambulatory Visit (HOSPITAL_BASED_OUTPATIENT_CLINIC_OR_DEPARTMENT_OTHER): Payer: 59 | Admitting: Oncology

## 2014-10-13 ENCOUNTER — Encounter: Payer: Self-pay | Admitting: Oncology

## 2014-10-13 VITALS — BP 107/64 | HR 69 | Temp 98.3°F | Resp 20 | Ht 63.0 in | Wt 211.4 lb

## 2014-10-13 DIAGNOSIS — D509 Iron deficiency anemia, unspecified: Secondary | ICD-10-CM

## 2014-10-13 DIAGNOSIS — D500 Iron deficiency anemia secondary to blood loss (chronic): Secondary | ICD-10-CM

## 2014-10-13 DIAGNOSIS — R5383 Other fatigue: Secondary | ICD-10-CM

## 2014-10-13 LAB — CBC WITH DIFFERENTIAL/PLATELET
BASO%: 0.6 % (ref 0.0–2.0)
Basophils Absolute: 0 10*3/uL (ref 0.0–0.1)
EOS%: 1.9 % (ref 0.0–7.0)
Eosinophils Absolute: 0.1 10*3/uL (ref 0.0–0.5)
HCT: 36.2 % (ref 34.8–46.6)
HGB: 11.5 g/dL — ABNORMAL LOW (ref 11.6–15.9)
LYMPH%: 46.7 % (ref 14.0–49.7)
MCH: 26.9 pg (ref 25.1–34.0)
MCHC: 31.7 g/dL (ref 31.5–36.0)
MCV: 85 fL (ref 79.5–101.0)
MONO#: 0.4 10*3/uL (ref 0.1–0.9)
MONO%: 5.8 % (ref 0.0–14.0)
NEUT#: 3 10*3/uL (ref 1.5–6.5)
NEUT%: 45 % (ref 38.4–76.8)
PLATELETS: 332 10*3/uL (ref 145–400)
RBC: 4.25 10*6/uL (ref 3.70–5.45)
RDW: 13.8 % (ref 11.2–14.5)
WBC: 6.6 10*3/uL (ref 3.9–10.3)
lymph#: 3.1 10*3/uL (ref 0.9–3.3)

## 2014-10-13 LAB — IRON AND TIBC CHCC
%SAT: 8 % — ABNORMAL LOW (ref 21–57)
IRON: 36 ug/dL — AB (ref 41–142)
TIBC: 430 ug/dL (ref 236–444)
UIBC: 394 ug/dL — AB (ref 120–384)

## 2014-10-13 LAB — FERRITIN CHCC: FERRITIN: 10 ng/mL (ref 9–269)

## 2014-10-13 NOTE — Progress Notes (Signed)
ADDENDUM Medical Oncology  Iron studies have returned low, as follows: Ferritin 10 Serum iron 36, %sat 8.   RN to let her know labs and: She needs to try to take the oral ferrous fumarate at least a couple of times weekly. She should not donate blood again, at least until she is postmenopausal. She should have CBC repeated at appointments scheduled with gyn and PCP in Jan and May.  Godfrey Pick, MD

## 2014-10-13 NOTE — Telephone Encounter (Signed)
Told Margaret [name] that her iron studies are low.  Dr. Marko Plume would like for her to take the Ferrous fumarate a couple of times a week. She recommends that she not donate blood until she is post menopausal. Pt. Verbalized understanding.   Office note 11-18 and labs routed to Dr. Inda Merlin and Dr. Radene Knee as patient requested.

## 2014-10-13 NOTE — Progress Notes (Signed)
OFFICE PROGRESS NOTE   10/13/2014   Physicians:Gates, Butch Penny, H.Holderness, J.McComb, C.Gessner. Appointment with cardiology upcoming, ? PVCs.  INTERVAL HISTORY:  Patient is seen, alone for visit, in scheduled follow up of iron deficiency anemia which appears related to heavy menses and long past blood donations. 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  She reports ongoing heavy menses, every 5 weeks. She has regular visit with Dr Radene Knee ~ Jan.  She donated blood in October for first time in past year, reports hemoglobin "just met cut off" for donation then (see below). She does not tolerate oral iron even as ferrous fumarate well, this causing GI upset and constipation when she took it daily prior to reduction mammoplasties 10-2013; she has not taken any oral iron recently tho does have this available. She lost weight with healthy challenge at work, with diet and running 2 miles daily; she has stopped the diet and exercise since that challenge completed.    She had flu vaccine  ONCOLOGIC HISTORY Patient has history of anemia, tho only prior CBC available at consultation visit 10-07-2013 is from Dr Darcus Austin 09-03-13 with WBC 4.0, Hgb 11.8, MCV 82.6 and plt 291k, with serum iron 50, %sat 13 and ferritin 7.8. BMET from same date was normal including creatinine 0.74. Patient reports heavy menses lasting 5-6 days, then spotting ~ every other week between periods; endometrial biopsy by Dr Radene Knee [phone] had benign proliferative endometrium. She has not seen other bleeding. She has tolerated OTC iron poorly in past, mostly with constipation. She has had upper endoscopy by Dr Carlean Purl, that report not available in this EMR but reportedly not remarkable.   Review of systems as above, also: Intermittent "catches" in breathing with irregular heartbeat, may be exacerbated by caffeine. No other bleeding. Exercise induced asthma unchanged. No  recent infectious illness. No LE swelling. No new or different pain. Has recovered well from reduction mammoplasties, very pleased with this. Sleeps well at night, does not need ambien. Energy much better with change in work schedule. Per patient, Hgb A1c elevated, just had follow up labs by Dr Inda Merlin. Remainder of 10 point Review of Systems negative.  Objective:  Vital signs in last 24 hours:  BP 107/64 mmHg  Pulse 69  Temp(Src) 98.3 F (36.8 C) (Oral)  Resp 20  Ht _0  (1.6 m)  Wt 211 lb 6.4 oz (95.89 kg)  BMI 37.46 kg/m2 Weight up 2 lbs from Jan 2015, but reportedly was down to 188 summer 2015. Looks much more comfortable overall Alert, oriented and appropriate. Ambulatory without  difficulty.    HEENT:PERRL, sclerae not icteric. Oral mucosa moist without lesions, posterior pharynx clear.  Neck supple. No JVD.  Lymphatics:no cervical,suraclavicular or inguinal adenopathy Resp: clear to auscultation bilaterally Cardio: regular rate and rhythm. No gallop. GI: abdomen obese, soft, nontender, no appreciable organomegaly. Normally active bowel sounds.  Musculoskeletal/ Extremities: without pitting edema, cords, tenderness Neuro: no peripheral neuropathy. Otherwise nonfocal Skin: mucous membranes and nailbeds pink   Lab Results:  Results for orders placed or performed in visit on 10/13/14  CBC with Differential  Result Value Ref Range   WBC 6.6 3.9 - 10.3 10e3/uL   NEUT# 3.0 1.5 - 6.5 10e3/uL   HGB 11.5 (L) 11.6 - 15.9 g/dL   HCT 36.2 34.8 - 46.6 %   Platelets 332 145 - 400 10e3/uL   MCV 85.0 79.5 - 101.0 fL   MCH 26.9 25.1 -  34.0 pg   MCHC 31.7 31.5 - 36.0 g/dL   RBC 4.25 3.70 - 5.45 10e6/uL   RDW 13.8 11.2 - 14.5 %   lymph# 3.1 0.9 - 3.3 10e3/uL   MONO# 0.4 0.1 - 0.9 10e3/uL   Eosinophils Absolute 0.1 0.0 - 0.5 10e3/uL   Basophils Absolute 0.0 0.0 - 0.1 10e3/uL   NEUT% 45.0 38.4 - 76.8 %   LYMPH% 46.7 14.0 - 49.7 %   MONO% 5.8 0.0 - 14.0 %   EOS% 1.9 0.0 - 7.0 %    BASO% 0.6 0.0 - 2.0 %    Iron and ferritin sent and pending.  Studies/Results:  No results found.  Medications: I have reviewed the patient's current medications. I have encouraged her to try taking the ferrous fumarate 1-2x weekly, particularly with ongoing menses and absolutely if she does further blood donations  DISCUSSION: Her desire to help with blood donations is certainly admirable, however I have told her that, especially as she tolerates oral iron poorly, has regular periods and iron deficiency history, avoiding blood donation seems preferable.  We will let her know results of iron studies when available. It would be reasonable to have Hgb checked at gyn visit in Jan 2016 and with PE by Dr Inda Merlin in May 2016. I am glad to see her back if needed, but have not scheduled regular follow up appointment at this office.   Assessment/Plan:  1.iron deficiency which appears related to chronic menstrual blood loss: improvement even with some limited oral ferrous fumarate, which hopefully she can continue at least on occasional basis as above 2.post bilateral reduction mammoplasties 10-27-13 due in part to chronic neck/upper back symptoms causing HAs.Successful surgery. 3.fatigue: multifactorial, improved with change to day shift and other interventions as above. 4.gestational DM and elevated hgb A1c recently, PCP following. 5.obesity: good results with diet and exercise thru summer, but has regained weight off of that program. She seems motivated to address this again.      Tracy Kinner P, MD   10/13/2014, 9:09 AM

## 2014-10-25 ENCOUNTER — Ambulatory Visit: Payer: 59 | Admitting: Cardiology

## 2014-11-15 ENCOUNTER — Ambulatory Visit (INDEPENDENT_AMBULATORY_CARE_PROVIDER_SITE_OTHER): Payer: 59 | Admitting: Cardiology

## 2014-11-15 ENCOUNTER — Encounter: Payer: Self-pay | Admitting: Cardiology

## 2014-11-15 ENCOUNTER — Ambulatory Visit
Admission: RE | Admit: 2014-11-15 | Discharge: 2014-11-15 | Disposition: A | Discharge status: Home / Self Care | Payer: 59 | Source: Ambulatory Visit | Attending: Cardiology | Admitting: Cardiology

## 2014-11-15 VITALS — BP 122/76 | HR 63 | Ht 63.0 in | Wt 216.0 lb

## 2014-11-15 DIAGNOSIS — R002 Palpitations: Secondary | ICD-10-CM

## 2014-11-15 DIAGNOSIS — R0989 Other specified symptoms and signs involving the circulatory and respiratory systems: Secondary | ICD-10-CM

## 2014-11-15 NOTE — Patient Instructions (Signed)
Your physician has recommended that you wear an event monitor. Event monitors are medical devices that record the heart's electrical activity. Doctors most often [place] these monitors to diagnose arrhythmias. Arrhythmias are problems with the speed or rhythm of the heartbeat. The monitor is a small, portable device. You can wear one while you do your normal daily activities. This is usually used to diagnose what is causing palpitations/syncope (passing out).  A chest x-ray takes a picture of the organs and structures inside the chest, including the heart, lungs, and blood vessels. This test can show several things, including, whether the heart is enlarges; whether fluid is building up in the lungs; and whether pacemaker / defibrillator leads are still in place.  Your physician wants you to follow-up in: as needed

## 2014-11-15 NOTE — Progress Notes (Signed)
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  She is seen at the request of Dr. Darcus Austin regarding intermittent palpitations.  The patient has a long history of intermittent sensation that her heart is skipping or jumping.  These episodes last only a few seconds.  They are not associated with any chest pain or dizziness or presyncope or syncope.  The episodes can occur at work or when she is at home resting.  They have not bothered her when she is in bed at night and they do not wake her up from sleep.  The patient has not been having any symptoms of chest pain or angina.  She does have a history of elevated cholesterol and borderline diabetes mellitus.  She has had significant swings in her weight over the past year.  Presently her weight is up 20 pounds since February 2015.  The patient has a past history of stress and has seen psychiatry.  She is on Lamictal and Depakote.  She has a history of low back pain and spasms for which she has taken Valium in the past. Her family history reveals that her father died unexpectedly at age 1.  He had a past history of bipolar illness as well as Crohn's disease.  The patient's mother died in her 2s of COPD and complications following a CODE BLUE.  Current Outpatient Prescriptions  Medication Sig Dispense Refill  . albuterol (PROVENTIL HFA;VENTOLIN HFA) 108 (90 BASE) MCG/ACT inhaler Inhale 2 puffs into the lungs every 6 (six) hours as needed for wheezing or shortness of breath.    . ALPRAZolam (XANAX XR) 0.5 MG 24 hr tablet Take 0.5 mg by mouth every 6 (six) hours as needed for anxiety.    . Biotin 10 MG TABS Take 1 tablet by mouth daily.    . diazepam (VALIUM) 5 MG tablet Take 5 mg by mouth every 12 (twelve) hours as needed for anxiety.    .  divalproex (DEPAKOTE ER) 500 MG 24 hr tablet Take 500 mg by mouth at bedtime.    . docusate sodium (COLACE) 100 MG capsule Take 1 capsule (100 mg total) by mouth daily. While on iron. 30 capsule 1  . Ferrous Fumarate 324 MG TABS Take 1 tab daily to twice a day on empty stomach with OJ or Vit.-C 60 each 1  . lamoTRIgine (LAMICTAL) 200 MG tablet Take 100 mg by mouth 2 (two) times daily.     Marland Kitchen zolpidem (AMBIEN) 10 MG tablet Take 5 mg by mouth at bedtime as needed for sleep.     No current facility-administered medications for this visit.    Allergies  Allergen Reactions  . Dilaudid [Hydromorphone] Itching  . Amoxicillin Hives and Swelling  . Ancef [Cefazolin] Hives  . Penicillins Hives and Swelling  . Adhesive [Tape] Other (See Comments)    SKIN IRRITATION  . Keflex [Cephalexin] Rash    Patient Active Problem List   Diagnosis Date Noted  . Pulse irregularity 11/15/2014  . Intermittent palpitations 11/15/2014  . Post-operative state 10/27/2013  . Iron deficiency anemia secondary to blood loss (chronic) 10/09/2013    History  Smoking status  . Never Smoker   Smokeless tobacco  . Never Used    History  Alcohol Use  . Yes    Comment: occasionally    Family History  Problem Relation Age of Onset  . Cancer Other   . COPD Other   . Hypertension Other   . Hyperlipidemia Other   . Diabetes Other   . COPD Mother   . Hypertension Mother   . Heart attack Father     Review of Systems: Constitutional: no fever chills diaphoresis or fatigue or change in weight.  Head and neck: no hearing loss, no epistaxis, no photophobia or visual disturbance. Respiratory: No cough, shortness of breath or wheezing. Cardiovascular: No chest pain peripheral edema, palpitations. Gastrointestinal: No abdominal distention, no abdominal pain, no change in bowel habits hematochezia or melena. Genitourinary: No dysuria, no frequency, no urgency, no nocturia. Musculoskeletal:No arthralgias, no back  pain, no gait disturbance or myalgias. Neurological: No dizziness, no headaches, no numbness, no seizures, no syncope, no weakness, no tremors. Hematologic: No lymphadenopathy, no easy bruising. Psychiatric: No confusion, no hallucinations, no sleep disturbance.   Wt Readings from Last 3 Encounters:  11/15/14 216 lb (97.977 kg)  10/13/14 211 lb 6.4 oz (95.89 kg)  01/05/14 194 lb 3.2 oz (88.089 kg)    Physical Exam: Filed Vitals:   11/15/14 1126  BP: 122/76  Pulse: 63  The patient appears to be in no distress.  Head and neck exam reveals that the pupils are equal and reactive.  The extraocular movements are full.  There is no scleral icterus.  Mouth and pharynx are benign.  No lymphadenopathy.  No carotid bruits.  The jugular venous pressure is normal.  Thyroid is not enlarged or tender.  Chest is clear to percussion and auscultation.  No rales or rhonchi.  Expansion of the chest is symmetrical.  Heart reveals no abnormal lift or heave.  First and second heart sounds are normal.  There is no murmur gallop rub or click.  The abdomen is soft and nontender.  Bowel sounds are normoactive.  There is no hepatosplenomegaly or mass.  There are no abdominal bruits.  Extremities reveal no phlebitis or edema.  Pedal pulses are good.  There is no cyanosis or clubbing.  Neurologic exam is normal strength and no lateralizing weakness.  No sensory deficits.  Integument reveals no rash  EKG today shows normal sinus rhythm and is within normal limits.  No premature beats are seen.  Assessment / Plan: 1.  Sporadic episodes of brief palpitations.  The cardiac arrhythmia causes her to feel like she is about to lose her breath.  Not associated with any chest discomfort.  The episodes occur perhaps once or twice a week but not every day.  The patient avoids caffeine and alcohol and she is a nonsmoker. 2.  Exercise-induced bronchospasm for which she has albuterol on hand  Disposition: We will get a  chest x-ray to be sure that she has a normal heart size.  We will have her wear a 30 day event monitor to try to document what her arrhythmia is.  She may be having short runs of atrial or ventricular beats.  Many thanks for the opportunity to see this pleasant woman with you.  We will be in touch with you regarding the results of her studies.

## 2014-11-17 ENCOUNTER — Encounter: Payer: Self-pay | Admitting: *Deleted

## 2014-11-17 ENCOUNTER — Ambulatory Visit (INDEPENDENT_AMBULATORY_CARE_PROVIDER_SITE_OTHER): Payer: 59 | Admitting: *Deleted

## 2014-11-17 DIAGNOSIS — R002 Palpitations: Secondary | ICD-10-CM

## 2014-11-17 DIAGNOSIS — R0989 Other specified symptoms and signs involving the circulatory and respiratory systems: Secondary | ICD-10-CM

## 2014-11-17 NOTE — Progress Notes (Signed)
Patient ID: Margaret [name], female   DOB: [birth year], 38 y.o.   MRN: [id number] [employer] 30 day cardiac event monitor applied to patient.

## 2014-11-23 ENCOUNTER — Encounter: Payer: Self-pay | Admitting: Cardiology

## 2014-12-14 ENCOUNTER — Telehealth: Payer: Self-pay | Admitting: *Deleted

## 2014-12-14 NOTE — Telephone Encounter (Signed)
Will forward to  Dr. Brackbill for review 

## 2014-12-14 NOTE — Telephone Encounter (Signed)
Agree with the advice given to remove the event monitor early because of skin irritation

## 2014-12-14 NOTE — Telephone Encounter (Signed)
-----   Message from Jennefer Bravo sent at 12/13/2014 10:44 AM EST ----- Patient called and wished to remove 30 day cardiac event monitor early. Expected end of service would be 12/17/14.  Patient broke out in rash from electrodes, has tried electrodes for sensitive skin, and is still breaking out in bumps.  Also, having trouble with sensitive skin electrodes not sticking.  Reviewed monitor to date, which showed one PAC, and one PVC, and a couple episodes of sinus tach under 110 bpm.  Told patient to go ahead and remove monitor and that I would notify you. Thanks, [employer]

## 2014-12-16 ENCOUNTER — Other Ambulatory Visit: Payer: Self-pay | Admitting: Obstetrics and Gynecology

## 2014-12-17 LAB — CYTOLOGY - PAP

## 2014-12-22 ENCOUNTER — Telehealth: Payer: Self-pay | Admitting: *Deleted

## 2014-12-22 NOTE — Telephone Encounter (Signed)
Advised patient   Event monitor (30 day) reviewed   Interpretation: NSR, rare PVC's, rare PAC's

## 2015-02-17 ENCOUNTER — Other Ambulatory Visit: Payer: Self-pay | Admitting: Orthopedic Surgery

## 2015-03-27 DIAGNOSIS — G5603 Carpal tunnel syndrome, bilateral upper limbs: Secondary | ICD-10-CM

## 2015-03-27 HISTORY — DX: Carpal tunnel syndrome, bilateral upper limbs: G56.03

## 2015-04-19 ENCOUNTER — Encounter (HOSPITAL_BASED_OUTPATIENT_CLINIC_OR_DEPARTMENT_OTHER): Payer: Self-pay | Admitting: *Deleted

## 2015-04-19 DIAGNOSIS — S60311A Abrasion of right thumb, initial encounter: Secondary | ICD-10-CM

## 2015-04-19 HISTORY — DX: Abrasion of right thumb, initial encounter: S60.311A

## 2015-04-22 ENCOUNTER — Encounter (HOSPITAL_BASED_OUTPATIENT_CLINIC_OR_DEPARTMENT_OTHER): Payer: Self-pay | Admitting: Anesthesiology

## 2015-04-22 ENCOUNTER — Ambulatory Visit (HOSPITAL_BASED_OUTPATIENT_CLINIC_OR_DEPARTMENT_OTHER): Payer: 59 | Admitting: Anesthesiology

## 2015-04-22 ENCOUNTER — Encounter (HOSPITAL_BASED_OUTPATIENT_CLINIC_OR_DEPARTMENT_OTHER)
Admission: RE | Disposition: A | Discharge status: Home / Self Care | Payer: Self-pay | Source: Ambulatory Visit | Attending: Orthopedic Surgery

## 2015-04-22 ENCOUNTER — Ambulatory Visit (HOSPITAL_BASED_OUTPATIENT_CLINIC_OR_DEPARTMENT_OTHER)
Admission: RE | Admit: 2015-04-22 | Discharge: 2015-04-22 | Disposition: A | Discharge status: Home / Self Care | Payer: 59 | Source: Ambulatory Visit | Attending: Orthopedic Surgery | Admitting: Orthopedic Surgery

## 2015-04-22 DIAGNOSIS — Z881 Allergy status to other antibiotic agents status: Secondary | ICD-10-CM | POA: Diagnosis not present

## 2015-04-22 DIAGNOSIS — G5601 Carpal tunnel syndrome, right upper limb: Secondary | ICD-10-CM | POA: Diagnosis not present

## 2015-04-22 DIAGNOSIS — J45990 Exercise induced bronchospasm: Secondary | ICD-10-CM | POA: Diagnosis not present

## 2015-04-22 DIAGNOSIS — G5602 Carpal tunnel syndrome, left upper limb: Secondary | ICD-10-CM | POA: Insufficient documentation

## 2015-04-22 DIAGNOSIS — F419 Anxiety disorder, unspecified: Secondary | ICD-10-CM | POA: Insufficient documentation

## 2015-04-22 DIAGNOSIS — R51 Headache: Secondary | ICD-10-CM | POA: Insufficient documentation

## 2015-04-22 DIAGNOSIS — Z880 Allergy status to penicillin: Secondary | ICD-10-CM | POA: Diagnosis not present

## 2015-04-22 DIAGNOSIS — Z885 Allergy status to narcotic agent status: Secondary | ICD-10-CM | POA: Diagnosis not present

## 2015-04-22 DIAGNOSIS — Z6838 Body mass index (BMI) 38.0-38.9, adult: Secondary | ICD-10-CM | POA: Insufficient documentation

## 2015-04-22 HISTORY — PX: CARPAL TUNNEL RELEASE: SHX101

## 2015-04-22 HISTORY — DX: Exercise induced bronchospasm: J45.990

## 2015-04-22 HISTORY — DX: Dental restoration status: Z98.811

## 2015-04-22 HISTORY — DX: Ventricular premature depolarization: I49.3

## 2015-04-22 HISTORY — DX: Abrasion of right thumb, initial encounter: S60.311A

## 2015-04-22 HISTORY — DX: Other headache syndrome: G44.89

## 2015-04-22 SURGERY — CARPAL TUNNEL RELEASE
Anesthesia: General | Site: Wrist | Laterality: Bilateral

## 2015-04-22 MED ORDER — CHLORHEXIDINE GLUCONATE 4 % EX LIQD: 60.0000 mL | Freq: Once | CUTANEOUS | Status: DC

## 2015-04-22 MED ORDER — FENTANYL CITRATE (PF) 100 MCG/2ML IJ SOLN
INTRAMUSCULAR | Status: DC | PRN
  Administered 2015-04-22: 100 ug via INTRAVENOUS
  Administered 2015-04-22: 25 ug via INTRAVENOUS

## 2015-04-22 MED ORDER — LIDOCAINE HCL (PF) 1 % IJ SOLN
INTRAMUSCULAR | Status: AC
  Filled 2015-04-22: qty 30

## 2015-04-22 MED ORDER — ONDANSETRON HCL 4 MG/2ML IJ SOLN
INTRAMUSCULAR | Status: DC | PRN
  Administered 2015-04-22: 4 mg via INTRAVENOUS

## 2015-04-22 MED ORDER — DOXYCYCLINE HYCLATE 50 MG PO CAPS: 100.0000 mg | ORAL_CAPSULE | Freq: Two times a day (BID) | ORAL | Status: DC

## 2015-04-22 MED ORDER — BUPIVACAINE HCL (PF) 0.25 % IJ SOLN
INTRAMUSCULAR | Status: AC
  Filled 2015-04-22: qty 30

## 2015-04-22 MED ORDER — FENTANYL CITRATE (PF) 100 MCG/2ML IJ SOLN: 25.0000 ug | INTRAMUSCULAR | Status: DC | PRN

## 2015-04-22 MED ORDER — FENTANYL CITRATE (PF) 100 MCG/2ML IJ SOLN
INTRAMUSCULAR | Status: AC
  Filled 2015-04-22: qty 4

## 2015-04-22 MED ORDER — LACTATED RINGERS IV SOLN
INTRAVENOUS | Status: DC
  Administered 2015-04-22: 08:00:00 via INTRAVENOUS

## 2015-04-22 MED ORDER — ONDANSETRON HCL 4 MG/2ML IJ SOLN
4.0000 mg | Freq: Four times a day (QID) | INTRAMUSCULAR | Status: AC | PRN
  Administered 2015-04-22: 4 mg via INTRAVENOUS

## 2015-04-22 MED ORDER — PROMETHAZINE HCL 25 MG/ML IJ SOLN
6.2500 mg | Freq: Once | INTRAMUSCULAR | Status: AC
  Administered 2015-04-22: 6.25 mg via INTRAVENOUS

## 2015-04-22 MED ORDER — MIDAZOLAM HCL 5 MG/5ML IJ SOLN
INTRAMUSCULAR | Status: DC | PRN
  Administered 2015-04-22: 2 mg via INTRAVENOUS

## 2015-04-22 MED ORDER — DEXAMETHASONE SODIUM PHOSPHATE 4 MG/ML IJ SOLN
INTRAMUSCULAR | Status: DC | PRN
  Administered 2015-04-22: 10 mg via INTRAVENOUS

## 2015-04-22 MED ORDER — PROPOFOL 10 MG/ML IV BOLUS
INTRAVENOUS | Status: DC | PRN
  Administered 2015-04-22: 180 mg via INTRAVENOUS

## 2015-04-22 MED ORDER — VANCOMYCIN HCL IN DEXTROSE 1-5 GM/200ML-% IV SOLN
1000.0000 mg | INTRAVENOUS | Status: AC
  Administered 2015-04-22: 1000 mg via INTRAVENOUS

## 2015-04-22 MED ORDER — PROMETHAZINE HCL 25 MG/ML IJ SOLN
INTRAMUSCULAR | Status: AC
  Filled 2015-04-22: qty 1

## 2015-04-22 MED ORDER — OXYCODONE HCL 5 MG PO TABS: 5.0000 mg | ORAL_TABLET | Freq: Once | ORAL | Status: DC | PRN

## 2015-04-22 MED ORDER — SODIUM CHLORIDE 0.9 % IJ SOLN
INTRAMUSCULAR | Status: AC
  Filled 2015-04-22: qty 10

## 2015-04-22 MED ORDER — MIDAZOLAM HCL 2 MG/2ML IJ SOLN
INTRAMUSCULAR | Status: AC
  Filled 2015-04-22: qty 2

## 2015-04-22 MED ORDER — LIDOCAINE HCL (CARDIAC) 20 MG/ML IV SOLN
INTRAVENOUS | Status: DC | PRN
  Administered 2015-04-22: 60 mg via INTRAVENOUS

## 2015-04-22 MED ORDER — VANCOMYCIN HCL IN DEXTROSE 1-5 GM/200ML-% IV SOLN
INTRAVENOUS | Status: AC
  Filled 2015-04-22: qty 200

## 2015-04-22 MED ORDER — SODIUM BICARBONATE 4 % IV SOLN
INTRAVENOUS | Status: AC
  Filled 2015-04-22: qty 5

## 2015-04-22 MED ORDER — KETOROLAC TROMETHAMINE 30 MG/ML IJ SOLN
INTRAMUSCULAR | Status: DC | PRN
Start: 2015-04-22 — End: 2015-04-22
  Administered 2015-04-22: 30 mg via INTRAVENOUS

## 2015-04-22 MED ORDER — OXYCODONE HCL 5 MG/5ML PO SOLN: 5.0000 mg | Freq: Once | ORAL | Status: DC | PRN

## 2015-04-22 MED ORDER — ONDANSETRON HCL 4 MG/2ML IJ SOLN
INTRAMUSCULAR | Status: AC
Start: 2015-04-22 — End: 2015-04-22
  Filled 2015-04-22: qty 2

## 2015-04-22 MED ORDER — BUPIVACAINE HCL (PF) 0.25 % IJ SOLN
INTRAMUSCULAR | Status: DC | PRN
  Administered 2015-04-22: 5 mL

## 2015-04-22 MED ORDER — HYDROCODONE-ACETAMINOPHEN 5-325 MG PO TABS: 2.0000 | ORAL_TABLET | ORAL | Status: DC | PRN

## 2015-04-22 SURGICAL SUPPLY — 49 items
BANDAGE ELASTIC 3 VELCRO ST LF (GAUZE/BANDAGES/DRESSINGS) ×3 IMPLANT
BLADE CARPAL TUNNEL SNGL USE (BLADE) ×2 IMPLANT
BLADE SURG 15 STRL LF DISP TIS (BLADE) ×2 IMPLANT
BLADE SURG 15 STRL SS (BLADE) ×4
BNDG CONFORM 3 STRL LF (GAUZE/BANDAGES/DRESSINGS) ×3 IMPLANT
BRUSH SCRUB EZ PLAIN DRY (MISCELLANEOUS) ×3 IMPLANT
CORDS BIPOLAR (ELECTRODE) ×2 IMPLANT
COVER BACK TABLE 60X90IN (DRAPES) ×2 IMPLANT
COVER MAYO STAND STRL (DRAPES) ×2 IMPLANT
CUFF TOURNIQUET SINGLE 18IN (TOURNIQUET CUFF) ×2 IMPLANT
DRAIN PENROSE 1/4X12 LTX STRL (WOUND CARE) IMPLANT
DRAPE EXTREMITY T 121X128X90 (DRAPE) ×3 IMPLANT
DRAPE SURG 17X23 STRL (DRAPES) ×3 IMPLANT
DRSG EMULSION OIL 3X3 NADH (GAUZE/BANDAGES/DRESSINGS) ×3 IMPLANT
GAUZE SPONGE 4X4 12PLY STRL (GAUZE/BANDAGES/DRESSINGS) IMPLANT
GAUZE SPONGE 4X4 16PLY XRAY LF (GAUZE/BANDAGES/DRESSINGS) IMPLANT
GAUZE XEROFORM 1X8 LF (GAUZE/BANDAGES/DRESSINGS) ×3 IMPLANT
GLOVE BIO SURGEON STRL SZ 6.5 (GLOVE) ×3 IMPLANT
GLOVE BIOGEL M STRL SZ7.5 (GLOVE) ×2 IMPLANT
GLOVE BIOGEL PI IND STRL 7.0 (GLOVE) IMPLANT
GLOVE BIOGEL PI INDICATOR 7.0 (GLOVE) ×3
GLOVE EXAM NITRILE EXT CUFF MD (GLOVE) ×1 IMPLANT
GLOVE SS BIOGEL STRL SZ 8 (GLOVE) ×1 IMPLANT
GLOVE SUPERSENSE BIOGEL SZ 8 (GLOVE) ×1
GOWN STRL REUS W/ TWL LRG LVL3 (GOWN DISPOSABLE) ×1 IMPLANT
GOWN STRL REUS W/ TWL XL LVL3 (GOWN DISPOSABLE) ×1 IMPLANT
GOWN STRL REUS W/TWL LRG LVL3 (GOWN DISPOSABLE) ×4
GOWN STRL REUS W/TWL XL LVL3 (GOWN DISPOSABLE) ×2
LOOP VESSEL MAXI BLUE (MISCELLANEOUS) IMPLANT
NDL HYPO 25X1 1.5 SAFETY (NEEDLE) ×2 IMPLANT
NDL SAFETY ECLIPSE 18X1.5 (NEEDLE) ×1 IMPLANT
NEEDLE HYPO 18GX1.5 SHARP (NEEDLE)
NEEDLE HYPO 22GX1.5 SAFETY (NEEDLE) IMPLANT
NEEDLE HYPO 25X1 1.5 SAFETY (NEEDLE) ×2 IMPLANT
NS IRRIG 1000ML POUR BTL (IV SOLUTION) ×2 IMPLANT
PACK BASIN DAY SURGERY FS (CUSTOM PROCEDURE TRAY) ×2 IMPLANT
PAD ALCOHOL SWAB (MISCELLANEOUS) ×8 IMPLANT
PAD CAST 3X4 CTTN HI CHSV (CAST SUPPLIES) ×2 IMPLANT
PADDING CAST ABS 4INX4YD NS (CAST SUPPLIES) ×1
PADDING CAST ABS COTTON 4X4 ST (CAST SUPPLIES) ×1 IMPLANT
PADDING CAST COTTON 3X4 STRL (CAST SUPPLIES) ×4
STOCKINETTE 4X48 STRL (DRAPES) ×3 IMPLANT
SUT PROLENE 4 0 PS 2 18 (SUTURE) ×2 IMPLANT
SYR BULB 3OZ (MISCELLANEOUS) ×2 IMPLANT
SYR CONTROL 10ML LL (SYRINGE) ×3 IMPLANT
TOWEL OR 17X24 6PK STRL BLUE (TOWEL DISPOSABLE) ×3 IMPLANT
TOWEL OR NON WOVEN STRL DISP B (DISPOSABLE) ×2 IMPLANT
TRAY DSU PREP LF (CUSTOM PROCEDURE TRAY) ×2 IMPLANT
UNDERPAD 30X30 (UNDERPADS AND DIAPERS) ×3 IMPLANT

## 2015-04-22 NOTE — Anesthesia Procedure Notes (Signed)
Procedure Name: LMA Insertion Performed by: Terrance Mass Pre-anesthesia Checklist: Patient identified, Timeout performed, Emergency Drugs available and Suction available Patient Re-evaluated:Patient Re-evaluated prior to inductionOxygen Delivery Method: Circle system utilized Preoxygenation: Pre-oxygenation with 100% oxygen Intubation Type: IV induction Ventilation: Mask ventilation without difficulty LMA: LMA inserted LMA Size: 4.0 Tube type: Oral Number of attempts: 1 Tube secured with: Tape Dental Injury: Teeth and Oropharynx as per pre-operative assessment

## 2015-04-22 NOTE — Op Note (Signed)
See dictation#[id number] Amedeo Plenty MD

## 2015-04-22 NOTE — Transfer of Care (Signed)
Immediate Anesthesia Transfer of Care Note  Patient: Margaret [name]  Procedure(s) Performed: Procedure(s): BILATERAL CARPAL TUNNEL RELEASE (Bilateral)  Patient Location: PACU  Anesthesia Type:General  Level of Consciousness: awake and sedated  Airway & Oxygen Therapy: Patient Spontanous Breathing and Patient connected to face mask oxygen  Post-op Assessment: Report given to RN and Post -op Vital signs reviewed and stable  Post vital signs: Reviewed and stable  Last Vitals:  Filed Vitals:   04/22/15 0659  BP: 128/87  Pulse: 86  Temp: 36.7 C  Resp: 18    Complications: No apparent anesthesia complications

## 2015-04-22 NOTE — Anesthesia Postprocedure Evaluation (Signed)
Anesthesia Post Note  Patient: Margaret [name]  Procedure(s) Performed: Procedure(s) (LRB): BILATERAL CARPAL TUNNEL RELEASE (Bilateral)  Anesthesia type: General  Patient location: PACU  Post pain: Pain level controlled and Adequate analgesia  Post assessment: Post-op Vital signs reviewed, Patient's Cardiovascular Status Stable, Respiratory Function Stable, Patent Airway and Pain level controlled  Last Vitals:  Filed Vitals:   04/22/15 0945  BP: 111/69  Pulse: 73  Temp:   Resp: 16    Post vital signs: Reviewed and stable  Level of consciousness: awake, alert  and oriented  Complications: No apparent anesthesia complications

## 2015-04-22 NOTE — H&P (Signed)
Margaret [name] is an 39 y.o. female.   Chief Complaint: plan bilateral carpal tunnel releases HPI: patient presents for bilateral carpal tunnel releases. She notes no other complaints today She denies neck back chest or abdominal pain.  I discussed all issues with she and her husband at length the do's and don'ts and all issues  Past Medical History  Diagnosis Date  . Obesity   . Anxiety   . Low serum iron     no current med.  . Seasonal allergies   . Complication of anesthesia   . PONV (postoperative nausea and vomiting)     nausea only  . Headache associated with hormonal factors   . Carpal tunnel syndrome on both sides 03/2015  . Exercise-induced asthma     prn inhaler  . PVCs (premature ventricular contractions)     states are benign  . Dental crown present   . Abrasion of right thumb 04/19/2015    Past Surgical History  Procedure Laterality Date  . Cesarean section  04/23/2002    twins  . Dilation and curettage of uterus    . Cesarean section  03/28/2005  . Lasik    . Breast reduction surgery Bilateral 10/27/2013    Procedure: MAMMARY REDUCTION  (BREAST) BILATERAL;  Surgeon: Erline Hau, MD;  Location: [facility];  Service: Plastics;  Laterality: Bilateral;    Family History  Problem Relation Age of Onset  . Cancer Other   . COPD Other   . Hypertension Other   . Hyperlipidemia Other   . Diabetes Other   . COPD Mother   . Hypertension Mother   . Heart attack Father    Social History:  reports that she has never smoked. She has never used smokeless tobacco. She reports that she drinks alcohol. She reports that she does not use illicit drugs.  Allergies:  Allergies  Allergen Reactions  . Dilaudid [Hydromorphone] Itching  . Amoxicillin Hives and Swelling  . Ancef [Cefazolin] Hives  . Penicillins Hives and Swelling  . Adhesive [Tape] Other (See Comments)    SKIN IRRITATION  . Keflex [Cephalexin] Rash    Medications Prior to Admission   Medication Sig Dispense Refill  . Biotin 10 MG TABS Take 1 tablet by mouth daily.    . divalproex (DEPAKOTE ER) 500 MG 24 hr tablet Take 500 mg by mouth at bedtime.    . lamoTRIgine (LAMICTAL) 200 MG tablet Take 100 mg by mouth 2 (two) times daily.     Marland Kitchen albuterol (PROVENTIL HFA;VENTOLIN HFA) 108 (90 BASE) MCG/ACT inhaler Inhale 2 puffs into the lungs every 6 (six) hours as needed for wheezing or shortness of breath.      No results found for this or any previous visit (from the past 48 hour(s)). No results found.  Review of Systems  Eyes: Negative.   Respiratory: Negative.   Cardiovascular: Negative.   Gastrointestinal: Negative.   Genitourinary: Negative.   Skin: Negative.     Blood pressure 128/87, pulse 86, temperature 98.1 F (36.7 C), temperature source Oral, resp. rate 18, height 5\' 3"  (1.6 m), weight 97.297 kg (214 lb 8 oz), last menstrual period 04/18/2015, SpO2 99 %. Physical Exam positive Tinel's Phalen's and median nerve compression test bilaterally. She has no atrophy in her abductor pollicis brevis. Skin is stable there is no evidence of infection or dystrophy. The patient is alert and oriented in no acute distress. The patient complains of pain in the affected upper extremity.  The  patient is noted to have a normal HEENT exam. Lung fields show equal chest expansion and no shortness of breath. Abdomen exam is nontender without distention. Lower extremity examination does not show any fracture dislocation or blood clot symptoms. Pelvis is stable and the neck and back are stable and nontender. Assessment/Plan Plan bilateral carpal tunnel releases on the right and left sides. After long conversation and she understands the pre-and postoperative T and and our plans. She was given vancomycin preoperatively. We are planning surgery for your upper extremity. The risk and benefits of surgery to include risk of bleeding, infection, anesthesia,  damage to normal structures and  failure of the surgery to accomplish its intended goals of relieving symptoms and restoring function have been discussed in detail. With this in mind we plan to proceed. I have specifically discussed with the patient the pre-and postoperative regime and the dos and don'ts and risk and benefits in great detail. Risk and benefits of surgery also include risk of dystrophy(CRPS), chronic nerve pain, failure of the healing process to go onto completion and other inherent risks of surgery The relavent the pathophysiology of the disease/injury process, as well as the alternatives for treatment and postoperative course of action has been discussed in great detail with the patient who desires to proceed.  We will do everything in our power to help you (the patient) restore function to the upper extremity. It is a pleasure to see this patient today.  Paulene Floor 04/22/2015, 7:38 AM

## 2015-04-22 NOTE — Discharge Instructions (Signed)
Keep bandage clean and dry.  Call for any problems.  No smoking.  Criteria for driving a car: you should be off your pain medicine for 7-8 hours, able to drive one handed(confident), thinking clearly and feeling able in your judgement to drive. Continue elevation as it will decrease swelling.  If instructed by MD move your fingers within the confines of the bandage/splint.  Use ice if instructed by your MD. Call immediately for any sudden loss of feeling in your hand/arm or change in functional abilities of the extremity.   We recommend that you to take vitamin C 1000 mg a day to promote healing. We also recommend he take vitamin B 6 200 mg a day to promote nerve health We also recommend that if you require  pain medicine that you take a stool softener to prevent constipation as most pain medicines will have constipation side effects. We recommend either Peri-Colace or Senokot and recommend that you also consider adding MiraLAX to prevent the constipation affects from pain medicine if you are required to use them. These medicines are over the counter and maybe purchased at a local pharmacy. A cup of yogurt and a probiotic can also be helpful during the recovery process as the medicines can disrupt your intestinal environment.    Post Anesthesia Home Care Instructions  Activity: Get plenty of rest for the remainder of the day. A responsible adult should stay with you for 24 hours following the procedure.  For the next 24 hours, DO NOT: -Drive a car -Paediatric nurse -Drink alcoholic beverages -Take any medication unless instructed by your physician -Make any legal decisions or sign important papers.  Meals: Start with liquid foods such as gelatin or soup. Progress to regular foods as tolerated. Avoid greasy, spicy, heavy foods. If nausea and/or vomiting occur, drink only clear liquids until the nausea and/or vomiting subsides. Call your physician if vomiting continues.  Special  Instructions/Symptoms: Your throat may feel dry or sore from the anesthesia or the breathing tube placed in your throat during surgery. If this causes discomfort, gargle with warm salt water. The discomfort should disappear within 24 hours.  If you had a scopolamine patch placed behind your ear for the management of post- operative nausea and/or vomiting:  1. The medication in the patch is effective for 72 hours, after which it should be removed.  Wrap patch in a tissue and discard in the trash. Wash hands thoroughly with soap and water. 2. You may remove the patch earlier than 72 hours if you experience unpleasant side effects which may include dry mouth, dizziness or visual disturbances. 3. Avoid touching the patch. Wash your hands with soap and water after contact with the patch.

## 2015-04-22 NOTE — Anesthesia Preprocedure Evaluation (Signed)
Anesthesia Evaluation  Patient identified by MRN, date of birth, ID band Patient awake    Reviewed: Allergy & Precautions, NPO status , Patient's Chart, lab work & pertinent test results  History of Anesthesia Complications (+) PONV and history of anesthetic complications  Airway Mallampati: II   Neck ROM: full    Dental   Pulmonary asthma ,  breath sounds clear to auscultation        Cardiovascular negative cardio ROS  Rhythm:regular Rate:Normal     Neuro/Psych  Headaches, Anxiety  Neuromuscular disease    GI/Hepatic   Endo/Other  Morbid obesity  Renal/GU      Musculoskeletal   Abdominal   Peds  Hematology   Anesthesia Other Findings   Reproductive/Obstetrics                             Anesthesia Physical Anesthesia Plan  ASA: II  Anesthesia Plan: General   Post-op Pain Management:    Induction: Intravenous  Airway Management Planned: LMA  Additional Equipment:   Intra-op Plan:   Post-operative Plan:   Informed Consent: I have reviewed the patients History and Physical, chart, labs and discussed the procedure including the risks, benefits and alternatives for the proposed anesthesia with the patient or authorized representative who has indicated his/her understanding and acceptance.     Plan Discussed with: CRNA, Anesthesiologist and Surgeon  Anesthesia Plan Comments:         Anesthesia Quick Evaluation

## 2015-04-26 ENCOUNTER — Encounter (HOSPITAL_BASED_OUTPATIENT_CLINIC_OR_DEPARTMENT_OTHER): Payer: Self-pay | Admitting: Orthopedic Surgery

## 2015-04-26 LAB — POCT HEMOGLOBIN-HEMACUE: Hemoglobin: 13.5 g/dL (ref 12.0–15.0)

## 2015-04-26 NOTE — Op Note (Signed)
Margaret [name], Margaret [name]                ACCOUNT NO.:  [id number]  MEDICAL RECORD NO.:  [id number]  LOCATION:                               FACILITY:  [facility]  PHYSICIAN:  Satira Anis. Jakiyah Stepney, M.D.DATE OF BIRTH:  Jul 18, 1976  DATE OF PROCEDURE:  04/22/2015 DATE OF DISCHARGE:  04/22/2015                              OPERATIVE REPORT   PREOPERATIVE DIAGNOSIS:  Bilateral carpal tunnel syndrome.  POSTOPERATIVE DIAGNOSIS:  Bilateral carpal tunnel syndrome.  PROCEDURE: 1. Right open carpal tunnel release. 2. Left open carpal tunnel release.  SURGEON:  Satira Anis. Amedeo Plenty, MD  ASSISTANT:  None.  COMPLICATIONS:  None.  ANESTHESIA:  General.  ESTIMATED BLOOD LOSS:  Minimal.  TOURNIQUET TIME:  Seven minutes, left; 6 minutes, right.  INDICATIONS:  A 39 year old female with the above-mentioned diagnosis. I have counseled her in regard to risks and benefits of surgery including risk of infection, bleeding, anesthesia, damage to normal structures, and failure of surgery to accomplish its intended goals of relieving symptoms and restoring function.  With this in mind, she desires to proceed.  All questions have been encouraged and answered preoperatively.  OPERATIVE PROCEDURE:  The patient was seen by myself and Anesthesia. Preoperative vancomycin was given, due to a PENICILLIN and CEPHALOSPORIN allergy.  She was consented and taken to the operative theater.  In the operative theater, she underwent a general anesthetic.  She had prep and drape with Betadine scrub and paint, supervised by myself about both upper extremities.  Final time-out was called.  Pre and postop check was complete.  Body parts were well padded and the operation commenced with insufflation of the right tourniquet to 250 mmHg.  Incision was made 1 cm in nature.  Dissection was carried down.  Palmar fascia incised distally, transcarpal ligament was released under 4.0 loupe magnification under direct vision.  Fat pad egressed  nicely. Superficial palmar arch and median nerve carefully protected.  Following this distal to proximal dissection was carried out until adequate room was available for canal, prepared toward device 1, followed by undermining the tissues and sliding the rhytidectomy scissors through remaining leaflet.  This was done under direct vision and there were no complicating features.  Portions of the antebrachial fascia distally about the forearm were released as well.  Following this, we irrigated, secured hemostasis with bipolar electrocautery and noted that the nerve was hyperemic, intact and completely released.  I was pleased with the release and the findings. Tourniquet time was 7 minutes.  She was irrigated, closed with Prolene and dressed in the usual carpal tunnel dressing per my protocol.  Following this, attention was turned towards the left upper extremity where tourniquet was insufflated.  Dissection was carried down to 1 cm incision at the distal edge of the transverse carpal ligament. Dissection was carried through the palmar fascia.  Distal edge was released under 4.0 loupe magnification about the distal edge of the transverse carpal ligament carefully protecting the median nerve and superficial palmar arch.  Following this complete release was confirmed distally and distal to proximal dissection was carried out until adequate room was available for canal, prepared toward device 1, followed by undermining the tissues and sliding the  rhytidectomy scissors, the remaining leaflet proximally and in the portions of the antebrachial fascia under direct vision.  She tolerated this well.  We irrigated copiously, secured hemostasis with bipolar electrocautery and closed the wound ultimately with Prolene.  Tourniquet time was 6 minutes on the left side.  She tolerated the procedure well.  There were no issues.  Standard carpal tunnel dressing was applied.  She will be monitored in  recovery room, discharged to home.  I am going to place her on doxycycline, if she has little skin abrasion on her thenar region away from the surgical dissection, I am asking her to notify me should any problems occur.  She is to back for wound check in 7-10 days and Vicodin/Norco p.r.n. pain p.o.  Do's and don'ts have been discussed and all questions have been encouraged and answered.  Pleasure to participate in her surgical care, and we will look forward in participating in her postoperative recovery.     Satira Anis. Amedeo Plenty, M.D.   ______________________________ Satira Anis. Amedeo Plenty, M.D.    [facility]  D:  04/22/2015  T:  04/23/2015  Job:  [id number]

## 2015-08-05 ENCOUNTER — Other Ambulatory Visit: Payer: Self-pay

## 2015-08-05 DIAGNOSIS — Z1231 Encounter for screening mammogram for malignant neoplasm of breast: Secondary | ICD-10-CM

## 2015-09-06 ENCOUNTER — Ambulatory Visit
Admission: RE | Admit: 2015-09-06 | Discharge: 2015-09-06 | Disposition: A | Discharge status: Home / Self Care | Payer: 59 | Source: Ambulatory Visit

## 2015-09-06 DIAGNOSIS — Z1231 Encounter for screening mammogram for malignant neoplasm of breast: Secondary | ICD-10-CM

## 2015-10-04 ENCOUNTER — Other Ambulatory Visit (HOSPITAL_COMMUNITY): Payer: Self-pay | Admitting: Neurosurgery

## 2015-10-04 DIAGNOSIS — M549 Dorsalgia, unspecified: Secondary | ICD-10-CM

## 2015-10-14 ENCOUNTER — Ambulatory Visit (HOSPITAL_COMMUNITY): Admission: RE | Admit: 2015-10-14 | Payer: 59 | Source: Ambulatory Visit

## 2015-11-29 MED FILL — lamoTRIgine 100 MG TABS: 100 | 90 days supply | Qty: 180 | Fill #0

## 2015-11-29 MED FILL — DIVALPROEX SOD ER 500 MG TA: 500 | 90 days supply | Qty: 180 | Fill #0

## 2015-12-09 ENCOUNTER — Ambulatory Visit (HOSPITAL_COMMUNITY)
Admission: RE | Admit: 2015-12-09 | Discharge: 2015-12-09 | Disposition: A | Discharge status: Home / Self Care | Payer: 59 | Source: Ambulatory Visit | Attending: Neurosurgery | Admitting: Neurosurgery

## 2015-12-09 DIAGNOSIS — M549 Dorsalgia, unspecified: Secondary | ICD-10-CM

## 2015-12-09 DIAGNOSIS — Z0000 Encounter for general adult medical examination without abnormal findings: Secondary | ICD-10-CM | POA: Diagnosis not present

## 2015-12-09 DIAGNOSIS — M545 Low back pain: Secondary | ICD-10-CM | POA: Diagnosis not present

## 2015-12-09 DIAGNOSIS — N3942 Incontinence without sensory awareness: Secondary | ICD-10-CM | POA: Diagnosis not present

## 2015-12-09 DIAGNOSIS — M5126 Other intervertebral disc displacement, lumbar region: Secondary | ICD-10-CM | POA: Diagnosis not present

## 2015-12-09 MED ORDER — GADOBENATE DIMEGLUMINE 529 MG/ML IV SOLN
20.0000 mL | Freq: Once | INTRAVENOUS | Status: AC | PRN
  Administered 2015-12-09: 20 mL via INTRAVENOUS

## 2015-12-12 DIAGNOSIS — H5203 Hypermetropia, bilateral: Secondary | ICD-10-CM | POA: Diagnosis not present

## 2015-12-21 DIAGNOSIS — Z6838 Body mass index (BMI) 38.0-38.9, adult: Secondary | ICD-10-CM | POA: Diagnosis not present

## 2015-12-21 DIAGNOSIS — Z5181 Encounter for therapeutic drug level monitoring: Secondary | ICD-10-CM | POA: Diagnosis not present

## 2015-12-21 DIAGNOSIS — Z01419 Encounter for gynecological examination (general) (routine) without abnormal findings: Secondary | ICD-10-CM | POA: Diagnosis not present

## 2015-12-21 MED FILL — ASHLYNA 0.15-0.03-0.01 MG T: 0.15-0.03 & | 90 days supply | Qty: 91 | Fill #0

## 2016-01-04 DIAGNOSIS — F3175 Bipolar disorder, in partial remission, most recent episode depressed: Secondary | ICD-10-CM | POA: Diagnosis not present

## 2016-02-06 DIAGNOSIS — F3175 Bipolar disorder, in partial remission, most recent episode depressed: Secondary | ICD-10-CM | POA: Diagnosis not present

## 2016-03-12 MED FILL — ASHLYNA 0.15-0.03-0.01 MG T: 0.15-0.03 & | 90 days supply | Qty: 91 | Fill #0

## 2016-03-14 MED FILL — DIVALPROEX SOD ER 500 MG TA: 500 | 90 days supply | Qty: 180 | Fill #0

## 2016-03-14 MED FILL — lamoTRIgine 100 MG TABS: 100 | 90 days supply | Qty: 180 | Fill #0

## 2016-04-06 MED FILL — METHYLPREDNISOLONE 4 MG TAB: 4 | 6 days supply | Qty: 21 | Fill #0

## 2016-04-06 MED FILL — BENZONATATE 100 MG CAPSULE: 100 | 7 days supply | Qty: 21 | Fill #0

## 2016-04-30 DIAGNOSIS — Z5181 Encounter for therapeutic drug level monitoring: Secondary | ICD-10-CM | POA: Diagnosis not present

## 2016-04-30 DIAGNOSIS — N939 Abnormal uterine and vaginal bleeding, unspecified: Secondary | ICD-10-CM | POA: Diagnosis not present

## 2016-04-30 DIAGNOSIS — Z0000 Encounter for general adult medical examination without abnormal findings: Secondary | ICD-10-CM | POA: Diagnosis not present

## 2016-04-30 DIAGNOSIS — R7303 Prediabetes: Secondary | ICD-10-CM | POA: Diagnosis not present

## 2016-04-30 DIAGNOSIS — E669 Obesity, unspecified: Secondary | ICD-10-CM | POA: Diagnosis not present

## 2016-04-30 DIAGNOSIS — F3175 Bipolar disorder, in partial remission, most recent episode depressed: Secondary | ICD-10-CM | POA: Diagnosis not present

## 2016-04-30 DIAGNOSIS — E7800 Pure hypercholesterolemia, unspecified: Secondary | ICD-10-CM | POA: Diagnosis not present

## 2016-04-30 MED FILL — OGESTREL TABLET: 0.5-50 | 84 days supply | Qty: 112 | Fill #0

## 2016-05-18 DIAGNOSIS — F3175 Bipolar disorder, in partial remission, most recent episode depressed: Secondary | ICD-10-CM | POA: Diagnosis not present

## 2016-06-05 ENCOUNTER — Encounter: Payer: Self-pay | Admitting: Dietician

## 2016-06-05 ENCOUNTER — Encounter: Payer: 59 | Attending: Family Medicine | Admitting: Dietician

## 2016-06-05 VITALS — Ht 63.0 in | Wt 222.5 lb

## 2016-06-05 DIAGNOSIS — N939 Abnormal uterine and vaginal bleeding, unspecified: Secondary | ICD-10-CM | POA: Diagnosis not present

## 2016-06-05 DIAGNOSIS — Z713 Dietary counseling and surveillance: Secondary | ICD-10-CM | POA: Diagnosis not present

## 2016-06-05 DIAGNOSIS — E669 Obesity, unspecified: Secondary | ICD-10-CM

## 2016-06-05 NOTE — Patient Instructions (Addendum)
Overall cut back on sugar and fats from animal sources to help with cholesterol.  For breakfast - try Premier Protein with fruit or toast (peaches, apples, strawberries, canteloupe) OR peanut butter/jelly sandwich OR yogurt with fruit OR eggs with toast and fruit  Try non fat [place] Yogurt instead of sour cream Look for [employer] for recipes. Between Monday - Friday plan meals for the following week (so husband can help with shopping). Try using frozen vegetables instead of canned vegetables.

## 2016-06-05 NOTE — Progress Notes (Signed)
Medical Nutrition Therapy:  Appt start time: 0940 end time:  0950.   Assessment:  Primary concerns today: Margaret [name] is here today since her cholesterol has been up and she does not want to take medication since she does not feel like she will be compliant. Hgb A1c has been 5.9-6.2%. Previously saw me in late 2014 and early 2015. Weight has been fluctuating in the last few years (about 25 lbs). Has followed [facility] in the past. Does not do "fad diets". Does not like Weight Watchers since she doesn't like to "count".   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 She and her husband do the cooking. Has trouble getting dinner served to kids since husband gets home at 6:30. Kids have [employer] on Tuesday nights. Husband will watch diet with her but she likes sweets while husband likes savory. Has a sweet craving and issues with hormones. Has a doctor's appointment this week to discuss this. Does not have time to exercise since they get already up at 5:00 AM though sleeps until 11 AM on days she is not working. Feels like they are stuck in a food rut lately as a family. Does a lot shopping at [employer] due to finances.   Would like to eat better as a family since 2 of her three children are overweight. The third child is smaller and athletic (almost underweight).   Preferred Learning Style:   No preference indicated   Learning Readiness:   Ready  MEDICATIONS: see list   DIETARY INTAKE:  Usual eating pattern includes 3 meals and 2 snacks per day.  Avoided foods include: reuban sandwiches    24-hr recall:  B ( AM): on some work days bacon with french toast and hashed browns or egg and bacon (if eats later), at home fruit loops, lucky charms, mini wheats, or raisin brain with added sugar with 2% milk    Snk ( AM): sometimes peanut butter crackers or protein bar  L ( PM): nothing, leftover, peanut butter and jelly, cereal or Moe's on days off or when working  lunch is 1-3 and has sandwich with tuna sandwich and fritos or lean cuisine or marie calendar's lunch Snk ( PM): none D ( PM): meat, vegetable and pasta/mac/potato and cheese if husband or she cooks sometimes uses crock pot (meat, rice, corn), fish/portabello mushrooms sometimes or McDonald's or rotisserie chicken or McAllister's pasta, etc, and goes out to eat 1-2 x week Snk ( PM): sweets - sugar free ice cream, klondike, goes out to ice cream, cupcakes, or peanut butter Beverages: unsweet tea, Slim Fast in the morning or no time for breakfast, diet soda if out or regular soda if she is craving sweets, some water (particular)   Usual physical activity: not recently, though walks 10,000 steps at work   Estimated energy needs: 1800 calories 200 g carbohydrates 135 g protein 50 g fat  Progress Towards Goal(s):  In progress.   Nutritional Diagnosis:  [place]-3.3 Overweight/obesity As related to hx of excess consumption of energy dense and high carbohydrate foods.  As evidenced by BMI of 39.4    Intervention:  Nutrition counseling provided. Plan: Overall cut back on sugar and fats from animal sources to help with cholesterol.  For breakfast - try Premier Protein with fruit or toast (peaches, apples, strawberries, canteloupe) OR peanut butter/jelly sandwich OR yogurt with fruit OR eggs with toast and fruit  Try non fat [place] Yogurt instead of  sour cream Look for Cookinglight.com for recipes. Between Monday - Friday plan meals for the following week (so husband can help with shopping). Try using frozen vegetables instead of canned vegetables.   Teaching Method Utilized:  Visual Auditory Hands on  Handouts given during visit include:  none  Supplements given during visit include:  2 Chocolate Premier Protein, lot # [id number], exp 12/01/2015  Barriers to learning/adherence to lifestyle change: busy schedule  Demonstrated degree of understanding via:  Teach Back   Monitoring/Evaluation:   Dietary intake, exercise, and body weight in 2 month(s).

## 2016-06-21 MED FILL — lamoTRIgine 200 MG TABS: 200 | 30 days supply | Qty: 60 | Fill #0

## 2016-07-26 DIAGNOSIS — F3175 Bipolar disorder, in partial remission, most recent episode depressed: Secondary | ICD-10-CM | POA: Diagnosis not present

## 2016-07-31 DIAGNOSIS — E7800 Pure hypercholesterolemia, unspecified: Secondary | ICD-10-CM | POA: Diagnosis not present

## 2016-07-31 MED FILL — lamoTRIgine 100 MG TABS: 100 | 30 days supply | Qty: 120 | Fill #0

## 2016-07-31 MED FILL — OXcarbazepine 150 MG TABS: 150 | 35 days supply | Qty: 90 | Fill #0

## 2016-08-06 ENCOUNTER — Encounter: Payer: 59 | Attending: Family Medicine | Admitting: Dietician

## 2016-08-06 DIAGNOSIS — E669 Obesity, unspecified: Secondary | ICD-10-CM

## 2016-08-06 DIAGNOSIS — Z713 Dietary counseling and surveillance: Secondary | ICD-10-CM | POA: Insufficient documentation

## 2016-08-06 NOTE — Patient Instructions (Addendum)
Try non fat Greek Yogurt instead of sour cream. Look for Cookinglight.com for recipes. Try to get back to exercising when you can. (elliptical after work)

## 2016-08-06 NOTE — Progress Notes (Signed)
  Medical Nutrition Therapy:  Appt start time: 0830 end time:  900   Assessment:  Primary concerns today: Margaret [name] returns today with a 15.5 lbs in the past 2 lbs.Has not started exercising yet since she has been really busy. Has been working a lot of overtime.  All of cholesterol levels have dropped since June. Has not had another Hgb A1c yet. Not having many sodas.   Husband has also lost 15 lbs but is at a plateau. Husband has been doing most of the cooking lately.   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   First weight loss goal is 190 lbs and second goal is 160 lbs.   Preferred Learning Style:   No preference indicated   Learning Readiness:   Ready  MEDICATIONS: see list   DIETARY INTAKE:  Usual eating pattern includes 3 meals and 2 snacks per day.  Avoided foods include: reuban sandwiches    24-hr recall:  B ( AM): non fat yogurt with splenda with protein powder (22 g protein) and 1/2 cup frozen fruit Snk ( AM): sometimes peanut butter crackers or protein bar or crab queso dip with non fat chips or peanuts L ( PM): Weight Watchers frozen meals (under 30 grams of carbs) or leftover meat and vegetables or peanut butter and jelly sandwich with chips  Snk ( PM): none D ( PM): meat, chicken, [place], fish with salads or vegetables Snk ( PM): weight watchers fudgsicle sometimes  Beverages: unsweet tea, water with lemon  Usual physical activity: not recently, though walks 10,000 steps at work   Estimated energy needs: 1800 calories 200 g carbohydrates 135 g protein 50 g fat  Progress Towards Goal(s):  In progress.   Nutritional Diagnosis:  [place]-3.3 Overweight/obesity As related to hx of excess consumption of energy dense and high carbohydrate foods.  As evidenced by BMI of 39.4    Intervention:  Nutrition counseling provided. Plan: Try non fat Greek Yogurt instead of sour cream. Look for Cookinglight.com for recipes. Try to get back  to exercising when you can. (elliptical after work)   Teaching Method Utilized:  Ship broker Hands on  Handouts given during visit include:  none  Barriers to learning/adherence to lifestyle change: busy schedule  Demonstrated degree of understanding via:  Teach Back   Monitoring/Evaluation:  Dietary intake, exercise, and body weight in 2 month(s).

## 2016-08-16 ENCOUNTER — Other Ambulatory Visit: Payer: Self-pay | Admitting: Obstetrics and Gynecology

## 2016-08-16 DIAGNOSIS — Z9889 Other specified postprocedural states: Secondary | ICD-10-CM

## 2016-08-16 DIAGNOSIS — Z1231 Encounter for screening mammogram for malignant neoplasm of breast: Secondary | ICD-10-CM

## 2016-09-07 DIAGNOSIS — F3175 Bipolar disorder, in partial remission, most recent episode depressed: Secondary | ICD-10-CM | POA: Diagnosis not present

## 2016-09-07 MED FILL — OXcarbazepine 150 MG TABS: 150 | 30 days supply | Qty: 120 | Fill #0

## 2016-09-07 MED FILL — lamoTRIgine 100 MG TABS: 100 | 30 days supply | Qty: 120 | Fill #0

## 2016-09-11 ENCOUNTER — Ambulatory Visit
Admission: RE | Admit: 2016-09-11 | Discharge: 2016-09-11 | Disposition: A | Discharge status: Home / Self Care | Payer: 59 | Source: Ambulatory Visit | Attending: Obstetrics and Gynecology | Admitting: Obstetrics and Gynecology

## 2016-09-11 DIAGNOSIS — Z9889 Other specified postprocedural states: Secondary | ICD-10-CM

## 2016-09-11 DIAGNOSIS — Z1231 Encounter for screening mammogram for malignant neoplasm of breast: Secondary | ICD-10-CM | POA: Diagnosis not present

## 2016-10-03 MED FILL — OXcarbazepine 150 MG TABS: 150 | 30 days supply | Qty: 120 | Fill #1

## 2016-10-03 MED FILL — lamoTRIgine 100 MG TABS: 100 | 30 days supply | Qty: 120 | Fill #1

## 2016-10-09 ENCOUNTER — Encounter: Payer: 59 | Attending: Family Medicine | Admitting: Dietician

## 2016-10-09 DIAGNOSIS — D2239 Melanocytic nevi of other parts of face: Secondary | ICD-10-CM | POA: Diagnosis not present

## 2016-10-09 DIAGNOSIS — D2262 Melanocytic nevi of left upper limb, including shoulder: Secondary | ICD-10-CM | POA: Diagnosis not present

## 2016-10-09 DIAGNOSIS — D485 Neoplasm of uncertain behavior of skin: Secondary | ICD-10-CM | POA: Diagnosis not present

## 2016-10-09 DIAGNOSIS — L812 Freckles: Secondary | ICD-10-CM | POA: Diagnosis not present

## 2016-10-09 DIAGNOSIS — D2272 Melanocytic nevi of left lower limb, including hip: Secondary | ICD-10-CM | POA: Diagnosis not present

## 2016-10-09 DIAGNOSIS — Z713 Dietary counseling and surveillance: Secondary | ICD-10-CM | POA: Diagnosis not present

## 2016-10-09 DIAGNOSIS — D225 Melanocytic nevi of trunk: Secondary | ICD-10-CM | POA: Diagnosis not present

## 2016-10-09 DIAGNOSIS — D2261 Melanocytic nevi of right upper limb, including shoulder: Secondary | ICD-10-CM | POA: Diagnosis not present

## 2016-10-09 DIAGNOSIS — D2271 Melanocytic nevi of right lower limb, including hip: Secondary | ICD-10-CM | POA: Diagnosis not present

## 2016-10-09 DIAGNOSIS — L814 Other melanin hyperpigmentation: Secondary | ICD-10-CM | POA: Diagnosis not present

## 2016-10-09 NOTE — Progress Notes (Signed)
  Medical Nutrition Therapy:  Appt start time: [id number] end time:  1210   Assessment:  Primary concerns today: Keydy returns today with a 3 lbs loss in the past 2 months and down about 25 lbs since July on home scale. Feels like she fell off over Halloween. Weight has been as low as 201 lbs. [facility] but has not gone yet.  Being consistent with not frying meals.   Tried [place] Yogurt for sour cream and did not like it.   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   First weight loss goal is 190 lbs and second goal is 160 lbs.   Preferred Learning Style:   No preference indicated   Learning Readiness:   Ready  MEDICATIONS: see list   DIETARY INTAKE:  Usual eating pattern includes 3 meals and 2 snacks per day.  Avoided foods include: reuban sandwiches    24-hr recall:  B ( AM): non fat yogurt with splenda with protein powder (22 g protein) and 1/2 cup frozen fruit or cereal  Snk ( AM): sometimes peanut butter crackers or protein bar or crab queso dip with non fat chips or peanuts L ( PM): Weight Watchers frozen meals (under 30 grams of carbs) or leftover meat and vegetables or peanut butter and jelly sandwich with chips  Snk ( PM): none D ( PM): meat, chicken, [place], fish with salads or vegetables with mac and cheese Snk ( PM): weight watchers fudgsicle sometimes  Beverages: unsweet tea, water with lemon  Usual physical activity: not recently, though walks 10,000 steps at work   Estimated energy needs: 1800 calories 200 g carbohydrates 135 g protein 50 g fat  Progress Towards Goal(s):  In progress.   Nutritional Diagnosis:  [place]-3.3 Overweight/obesity As related to hx of excess consumption of energy dense and high carbohydrate foods.  As evidenced by BMI of 39.4    Intervention:  Nutrition counseling provided. Plan: Plan to go to the gym 3 x week on days your work until 3 PM. Do more meal planning/cooking ahead on  weekends.   Teaching Method Utilized:  Visual Auditory Hands on  Handouts given during visit include:  none  Barriers to learning/adherence to lifestyle change: busy schedule  Demonstrated degree of understanding via:  Teach Back   Monitoring/Evaluation:  Dietary intake, exercise, and body weight in 4 month(s).

## 2016-10-09 NOTE — Patient Instructions (Addendum)
Plan to go to the gym 3 x week on days your work until 3 PM. Do more meal planning/cooking ahead on weekends.

## 2016-10-22 DIAGNOSIS — F3175 Bipolar disorder, in partial remission, most recent episode depressed: Secondary | ICD-10-CM | POA: Diagnosis not present

## 2016-10-23 MED FILL — lamoTRIgine 100 MG TABS: 100 | 90 days supply | Qty: 360 | Fill #0

## 2016-10-23 MED FILL — OXcarbazepine 300 MG TABS: 300 | 30 days supply | Qty: 90 | Fill #0

## 2016-10-26 MED FILL — BUPROPION HCL XL 150 MG TAB: 150 | 30 days supply | Qty: 30 | Fill #0

## 2016-11-08 DIAGNOSIS — R7303 Prediabetes: Secondary | ICD-10-CM | POA: Diagnosis not present

## 2016-11-08 DIAGNOSIS — E669 Obesity, unspecified: Secondary | ICD-10-CM | POA: Diagnosis not present

## 2016-11-21 DIAGNOSIS — F3175 Bipolar disorder, in partial remission, most recent episode depressed: Secondary | ICD-10-CM | POA: Diagnosis not present

## 2016-11-22 MED FILL — OXcarbazepine 300 MG TABS: 300 | 90 days supply | Qty: 180 | Fill #0

## 2016-11-22 MED FILL — BUPROPION HCL XL 150 MG TAB: 150 | 90 days supply | Qty: 90 | Fill #0

## 2016-12-10 DIAGNOSIS — H5203 Hypermetropia, bilateral: Secondary | ICD-10-CM | POA: Diagnosis not present

## 2016-12-24 DIAGNOSIS — Z6835 Body mass index (BMI) 35.0-35.9, adult: Secondary | ICD-10-CM | POA: Diagnosis not present

## 2016-12-24 DIAGNOSIS — Z01419 Encounter for gynecological examination (general) (routine) without abnormal findings: Secondary | ICD-10-CM | POA: Diagnosis not present

## 2017-02-06 ENCOUNTER — Ambulatory Visit: Payer: 59 | Admitting: Skilled Nursing Facility1

## 2017-02-06 ENCOUNTER — Ambulatory Visit: Payer: 59 | Admitting: Dietician

## 2017-02-14 MED FILL — lamoTRIgine 100 MG TABS: 100 | 90 days supply | Qty: 360 | Fill #0

## 2017-02-14 MED FILL — BUPROPION HCL XL 150 MG TAB: 150 | 90 days supply | Qty: 90 | Fill #0

## 2017-03-05 DIAGNOSIS — F3281 Premenstrual dysphoric disorder: Secondary | ICD-10-CM | POA: Diagnosis not present

## 2017-03-05 DIAGNOSIS — F3175 Bipolar disorder, in partial remission, most recent episode depressed: Secondary | ICD-10-CM | POA: Diagnosis not present

## 2017-03-07 MED FILL — FLUoxetine HCL 20 MG CAPS: 20 | 90 days supply | Qty: 21 | Fill #0

## 2017-05-09 DIAGNOSIS — R7303 Prediabetes: Secondary | ICD-10-CM | POA: Diagnosis not present

## 2017-05-09 DIAGNOSIS — Z0000 Encounter for general adult medical examination without abnormal findings: Secondary | ICD-10-CM | POA: Diagnosis not present

## 2017-05-09 DIAGNOSIS — E669 Obesity, unspecified: Secondary | ICD-10-CM | POA: Diagnosis not present

## 2017-05-09 DIAGNOSIS — E7800 Pure hypercholesterolemia, unspecified: Secondary | ICD-10-CM | POA: Diagnosis not present

## 2017-05-09 DIAGNOSIS — J4520 Mild intermittent asthma, uncomplicated: Secondary | ICD-10-CM | POA: Diagnosis not present

## 2017-05-22 DIAGNOSIS — F3175 Bipolar disorder, in partial remission, most recent episode depressed: Secondary | ICD-10-CM | POA: Diagnosis not present

## 2017-05-22 MED FILL — lamoTRIgine 100 MG TABS: 100 | 90 days supply | Qty: 360 | Fill #0

## 2017-05-22 MED FILL — BUPROPION HCL XL 300 MG TAB: 300 | 90 days supply | Qty: 90 | Fill #0

## 2017-05-22 MED FILL — OXcarbazepine 300 MG TABS: 300 | 90 days supply | Qty: 90 | Fill #0

## 2017-05-22 MED FILL — VENTOLIN HFA 90 MCG INHALER: 108 (90 BAS | 17 days supply | Qty: 18 | Fill #0

## 2017-05-25 IMAGING — MR MR LUMBAR SPINE WO/W CM
4 of 9 series · 19 of 48 positions shown · IV contrast (yes)
Comparison: 12/21/2009

CLINICAL DATA: Mid low back since 3 pain. Pain to the touch at the
level of L4-5.

EXAM:
MRI LUMBAR SPINE WITHOUT AND WITH CONTRAST
TECHNIQUE: Multiplanar and multiecho pulse sequences of the lumbar spine were
obtained without and with intravenous contrast.
CONTRAST:  20mL MULTIHANCE GADOBENATE DIMEGLUMINE 529 MG/ML IV SOLN

[Series 3: T1 · sagittal · 4.0mm · 0.55mm/px · 4 of 14 slices shown (1 of 2)]
[im 1/14]
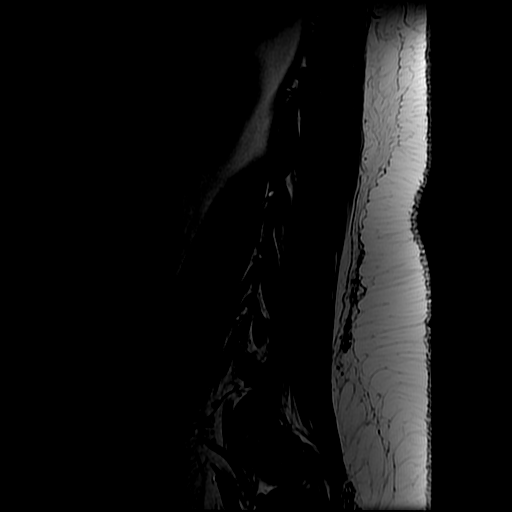
[im 5/14]
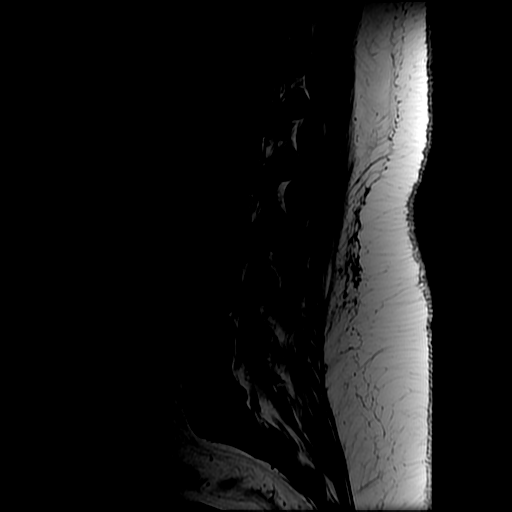
[im 9/14]
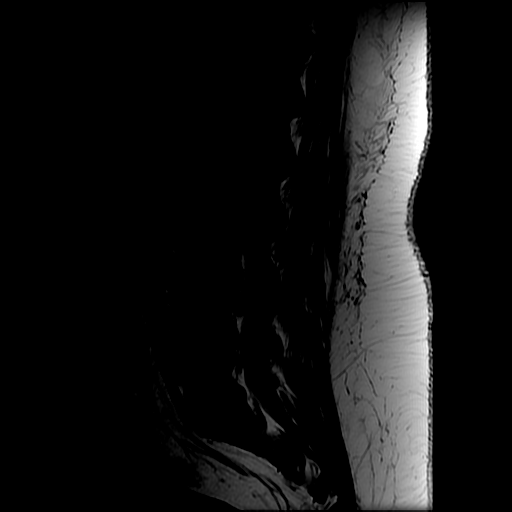
[im 14/14]
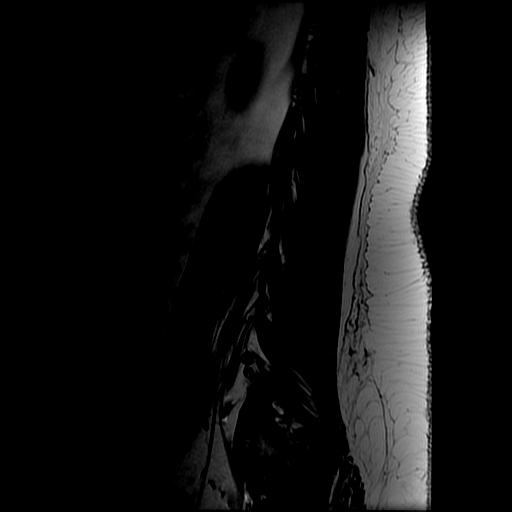

[Series 5: T2 · sagittal · 4.0mm · 0.55mm/px · 3 of 14 slices shown (1 of 2)]
[im 1/14]
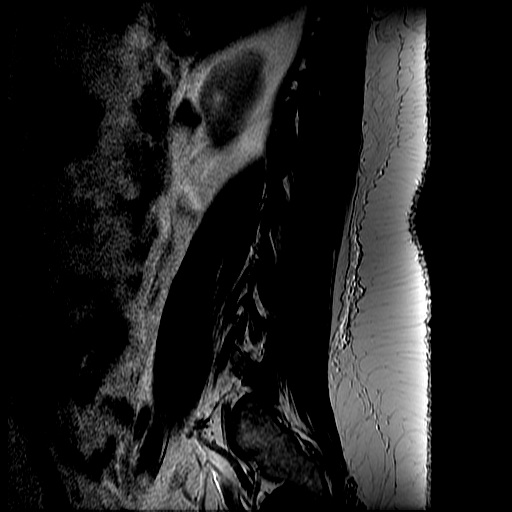
[im 7/14]
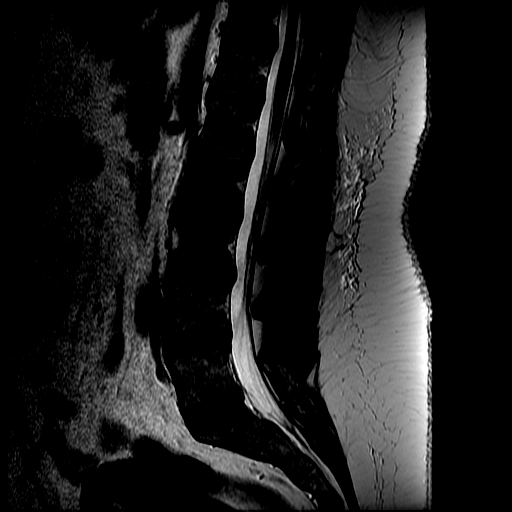
[im 14/14]
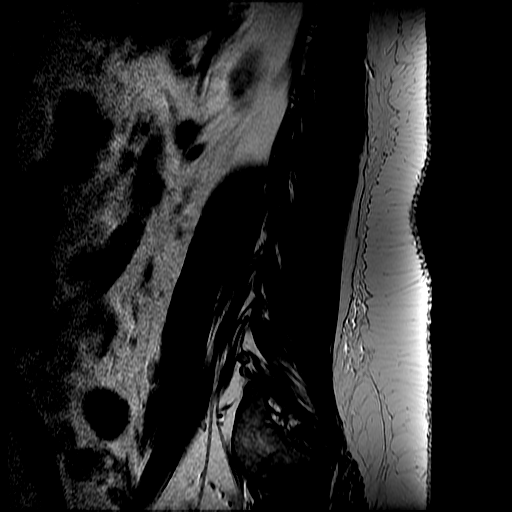

[Series 7: T2 · axial · 4.0mm · 0.39mm/px · z∈[-31,+159]mm · 8 of 38 slices shown (2 of 2)]
[im 1/38]
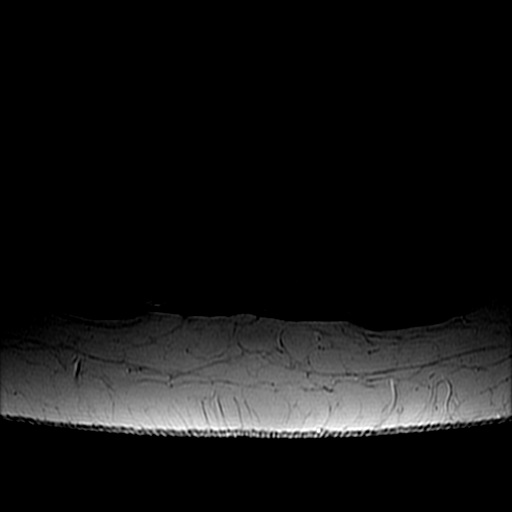
[im 6/38]
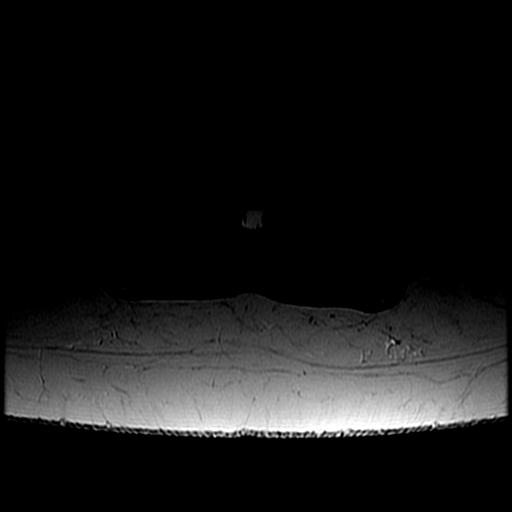
[im 11/38]
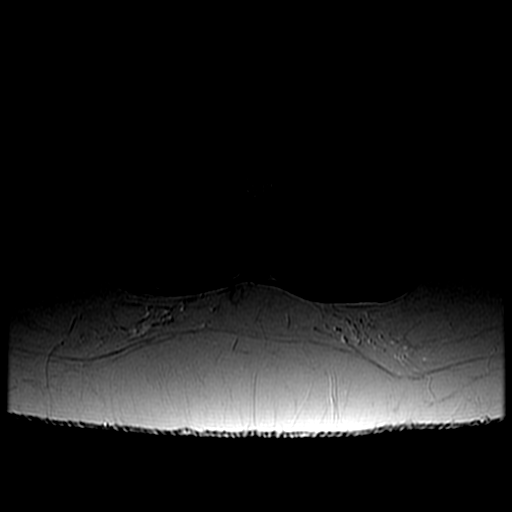
[im 16/38]
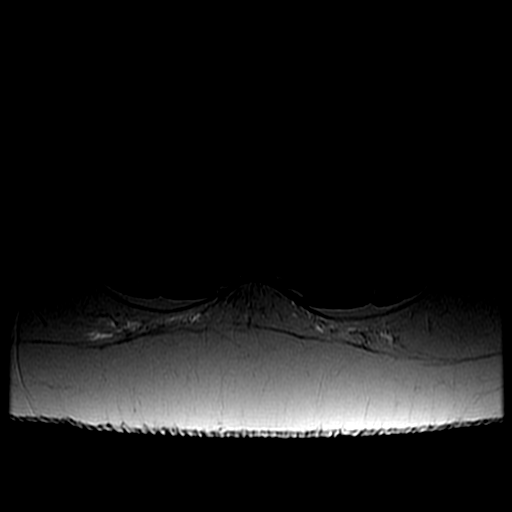
[im 22/38]
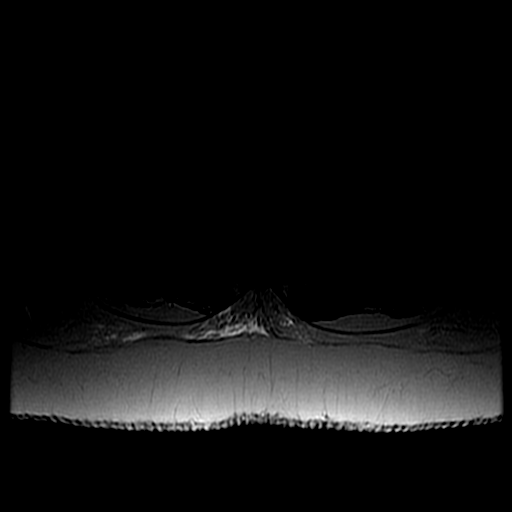
[im 27/38]
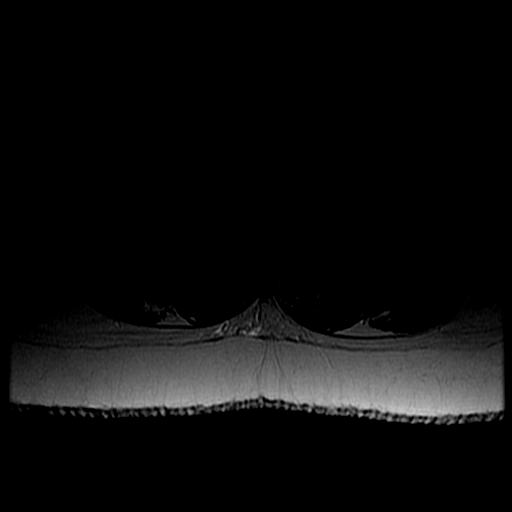
[im 32/38]
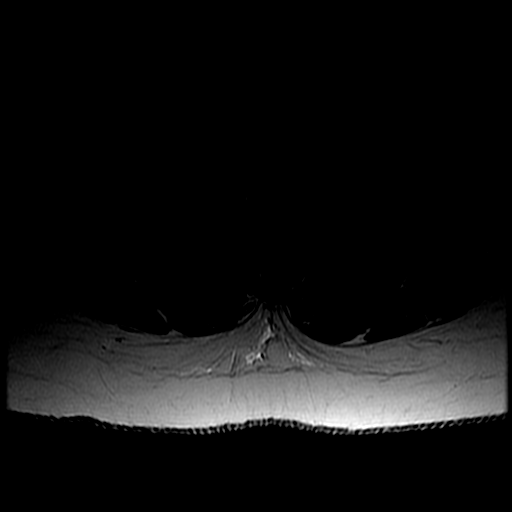
[im 38/38]
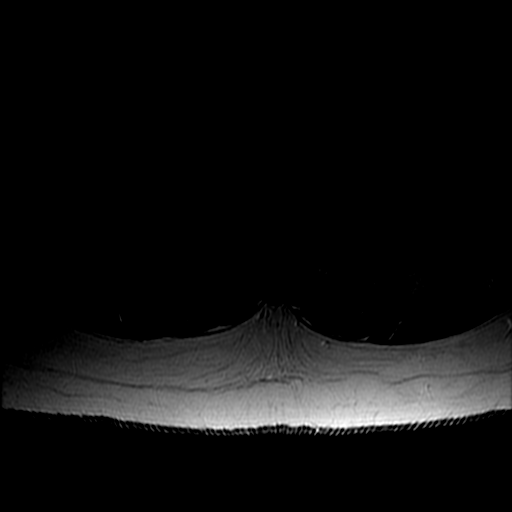

[Series 8: T1 · axial · 4.0mm · 0.39mm/px · z∈[-31,+129]mm · 4 of 39 slices shown (2 of 2)]
[im 1/39]
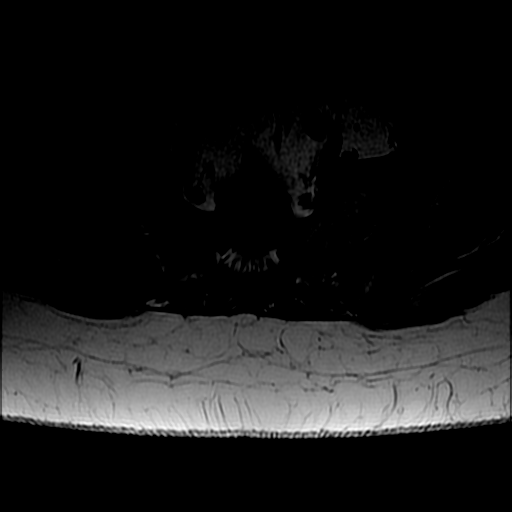
[im 6/39]
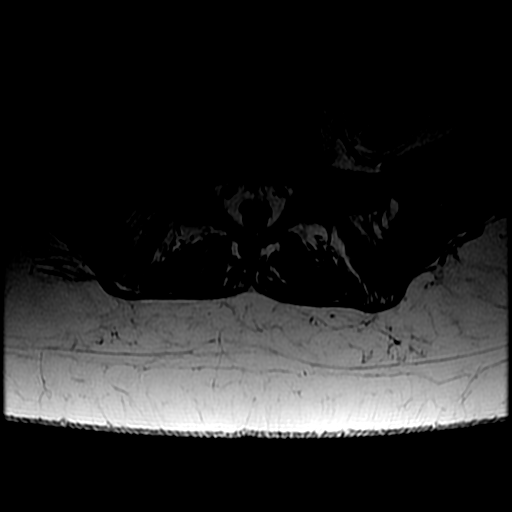
[im 22/39]
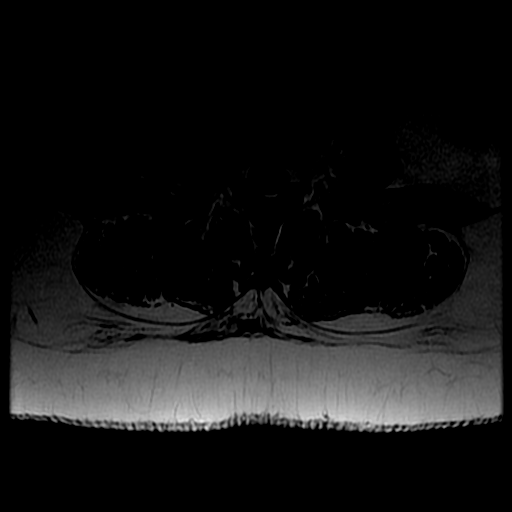
[im 33/39]
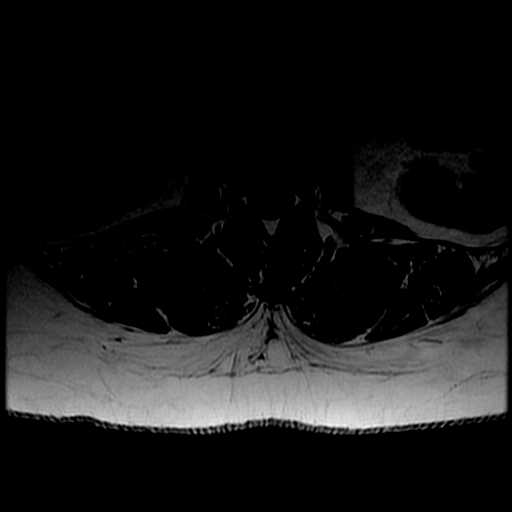

[19 of 48 positions shown; findings below may reference images not displayed]

FINDINGS: The vertebral bodies of the lumbar spine are normal in size. The
vertebral bodies of the lumbar spine are normal in alignment. There
is normal bone marrow signal demonstrated throughout the vertebra.
The intervertebral disc spaces are well-maintained. There is disc
desiccation at L3-4, L4-5 and L5-S1.

The spinal cord is normal in signal and contour. The cord terminates
normally at L1 . The nerve roots of the cauda equina and the filum
terminale are normal.

The visualized portions of the SI joints are unremarkable.

The imaged intra-abdominal contents are unremarkable.

T12-L1: No significant disc bulge. No evidence of neural foraminal
stenosis. No central canal stenosis.

L1-L2: No significant disc bulge. No evidence of neural foraminal
stenosis. No central canal stenosis.

L2-L3: No significant disc bulge. No evidence of neural foraminal
stenosis. No central canal stenosis.

L3-L4: Mild broad-based disc bulge. No evidence of neural foraminal
stenosis. No central canal stenosis.

L4-L5: Mild broad-based disc bulge. No evidence of neural foraminal
stenosis. No central canal stenosis.

L5-S1: Mild broad-based disc bulge. No evidence of neural foraminal
stenosis. No central canal stenosis. Mild bilateral facet
arthropathy.
IMPRESSION: 1. Mild broad-based disc bulges at L3-4, L4-5 and L5-S1. No
significant disc protrusion or foraminal stenosis. No findings to
explain the patient's sensory pain.

## 2017-08-15 ENCOUNTER — Other Ambulatory Visit: Payer: Self-pay | Admitting: Obstetrics and Gynecology

## 2017-08-15 DIAGNOSIS — Z1231 Encounter for screening mammogram for malignant neoplasm of breast: Secondary | ICD-10-CM

## 2017-08-28 DIAGNOSIS — M65342 Trigger finger, left ring finger: Secondary | ICD-10-CM | POA: Diagnosis not present

## 2017-09-13 ENCOUNTER — Ambulatory Visit
Admission: RE | Admit: 2017-09-13 | Discharge: 2017-09-13 | Disposition: A | Discharge status: Home / Self Care | Payer: 59 | Source: Ambulatory Visit | Attending: Obstetrics and Gynecology | Admitting: Obstetrics and Gynecology

## 2017-09-13 DIAGNOSIS — Z1231 Encounter for screening mammogram for malignant neoplasm of breast: Secondary | ICD-10-CM

## 2017-09-16 MED FILL — BUPROPION HCL XL 300 MG TAB: 300 | 90 days supply | Qty: 90 | Fill #1

## 2017-11-11 DIAGNOSIS — D485 Neoplasm of uncertain behavior of skin: Secondary | ICD-10-CM | POA: Diagnosis not present

## 2017-11-11 DIAGNOSIS — Z6835 Body mass index (BMI) 35.0-35.9, adult: Secondary | ICD-10-CM | POA: Diagnosis not present

## 2017-11-11 DIAGNOSIS — E669 Obesity, unspecified: Secondary | ICD-10-CM | POA: Diagnosis not present

## 2017-11-11 DIAGNOSIS — D225 Melanocytic nevi of trunk: Secondary | ICD-10-CM | POA: Diagnosis not present

## 2017-11-11 DIAGNOSIS — D1801 Hemangioma of skin and subcutaneous tissue: Secondary | ICD-10-CM | POA: Diagnosis not present

## 2017-11-11 DIAGNOSIS — L814 Other melanin hyperpigmentation: Secondary | ICD-10-CM | POA: Diagnosis not present

## 2017-11-11 DIAGNOSIS — J4520 Mild intermittent asthma, uncomplicated: Secondary | ICD-10-CM | POA: Diagnosis not present

## 2017-11-11 DIAGNOSIS — D2272 Melanocytic nevi of left lower limb, including hip: Secondary | ICD-10-CM | POA: Diagnosis not present

## 2017-11-11 DIAGNOSIS — D2271 Melanocytic nevi of right lower limb, including hip: Secondary | ICD-10-CM | POA: Diagnosis not present

## 2017-11-11 DIAGNOSIS — R7303 Prediabetes: Secondary | ICD-10-CM | POA: Diagnosis not present

## 2017-11-11 DIAGNOSIS — F3175 Bipolar disorder, in partial remission, most recent episode depressed: Secondary | ICD-10-CM | POA: Diagnosis not present

## 2017-11-13 MED FILL — lamoTRIgine 100 MG TABS: 100 | 90 days supply | Qty: 180 | Fill #0

## 2017-11-13 MED FILL — OXcarbazepine 300 MG TABS: 300 | 90 days supply | Qty: 90 | Fill #0

## 2017-11-26 DIAGNOSIS — Z90711 Acquired absence of uterus with remaining cervical stump: Secondary | ICD-10-CM

## 2017-11-26 HISTORY — DX: Acquired absence of uterus with remaining cervical stump: Z90.711

## 2017-12-25 MED FILL — OXYBUTYNIN CL ER 10 MG TAB: 10 | 30 days supply | Qty: 30 | Fill #0

## 2017-12-25 MED FILL — BUPROPION HCL XL 300 MG TAB: 300 | 90 days supply | Qty: 90 | Fill #0

## 2018-01-09 MED FILL — IBUPROFEN 600 MG TABLET: 600 | 7 days supply | Qty: 30 | Fill #0

## 2018-01-09 MED FILL — OXYCODONE/APAP 7.5/325MG: 7.5-325 | 3 days supply | Qty: 20 | Fill #0

## 2018-01-09 MED FILL — PROMETHAZINE 25 MG TABLET: 25 | 1 days supply | Qty: 4 | Fill #0

## 2018-01-22 MED FILL — OXYBUTYNIN CL ER 10 MG TAB: 10 | 30 days supply | Qty: 30 | Fill #1

## 2018-02-18 MED FILL — OXYBUTYNIN CL ER 10 MG TAB: 10 | 90 days supply | Qty: 90 | Fill #2

## 2018-02-18 MED FILL — OXcarbazepine 300 MG TABS: 300 | 90 days supply | Qty: 90 | Fill #1

## 2018-03-28 MED FILL — lamoTRIgine 100 MG TABS: 100 | 90 days supply | Qty: 180 | Fill #1

## 2018-04-07 DIAGNOSIS — M79642 Pain in left hand: Secondary | ICD-10-CM | POA: Insufficient documentation

## 2018-04-07 DIAGNOSIS — M6530 Trigger finger, unspecified finger: Secondary | ICD-10-CM | POA: Insufficient documentation

## 2018-04-30 MED FILL — buPROPion HCL ER (XL) 300 M: 300 | 90 days supply | Qty: 90 | Fill #0

## 2018-04-30 MED FILL — OXcarbazepine 300 MG TABS: 300 | 90 days supply | Qty: 90 | Fill #0

## 2018-05-30 MED FILL — OXYBUTYNIN CL ER 10 MG TAB: 10 | 90 days supply | Qty: 90 | Fill #3

## 2018-06-04 MED FILL — ERYTHROMYCIN BASE 250 MG TA: 250 | 7 days supply | Qty: 21 | Fill #0

## 2018-06-30 MED FILL — DOXYCYCLINE HYCLATE 100 MG: 100 | 9 days supply | Qty: 18 | Fill #0

## 2018-07-03 MED FILL — NAPROXEN SODIUM 550 MG TAB: 550 | 10 days supply | Qty: 30 | Fill #0

## 2018-07-03 MED FILL — lamoTRIgine 100 MG TABS: 100 | 90 days supply | Qty: 180 | Fill #0

## 2018-07-17 MED FILL — buPROPion HCL ER (XL) 300 M: 300 | 90 days supply | Qty: 90 | Fill #1

## 2018-08-29 ENCOUNTER — Other Ambulatory Visit: Payer: Self-pay | Admitting: Obstetrics and Gynecology

## 2018-08-29 DIAGNOSIS — Z1231 Encounter for screening mammogram for malignant neoplasm of breast: Secondary | ICD-10-CM

## 2018-09-03 NOTE — H&P (Signed)
Patient name Margaret [name], Margaret [name] DICTATION#  [id number] CSN# [id number]  Arvella Nigh, MD 09/03/2018 7:18 AM

## 2018-09-04 NOTE — H&P (Signed)
Margaret [name], Margaret [name] MEDICAL RECORD [id number] ACCOUNT [id number] DATE OF BIRTH:06/29/1976 FACILITY: [facility] LOCATION:  [place], [place]  HISTORY AND PHYSICAL  DATE OF ADMISSION:  09/22/2018  DATE OF SURGERY:  09/22/2018 at [facility] outpatient area at [facility].  HISTORY OF PRESENT ILLNESS:  The patient is a 42 year old gravida 45, para 11 female who presents for laparoscopic assisted vaginal hysterectomy, removal of both fallopian tubes.  The patient has been having trouble with worsening menorrhagia and dysmenorrhea.  Because of this, on 02/14 she underwent a hysteroscopy well as NovaSure ablation.  She has continued to have abnormal bleeding and increasing pain and discomfort despite  the procedure.  Ultrasound has revealed an intrauterine clot and possibly active endometrial tissue.  We discussed various options including attempt at D and C with hysteroscopy and possible hydrothermal ablation versus hysterectomy.  The patient stated  the latter and presents at the present time.  ALLERGIES:  SHE IS ALLERGIC TO PENICILLIN, KEFLEX, AND AMOXICILLIN.  MEDICATIONS:  She is on Lamictal 200 mg twice a day, Wellbutrin 150 mg a day, Trileptal 450 mg at night and ibuprofen as needed.  PAST MEDICAL HISTORY:  She has a history of anxiety and depression for which she is on above medications.  Also a history of migraine headaches.  Otherwise, usual childhood diseases  PAST SURGICAL HISTORY:  Two cesarean sections.  She had LASIK eye surgery.  She has had a breast reduction, D and C and carpal tunnel surgery.  SOCIAL HISTORY:  Reveals no tobacco or alcohol use.  FAMILY HISTORY:  Significant for history of hypertension and diabetes.  REVIEW OF SYSTEMS:  Noncontributory.  PHYSICAL EXAMINATION: VITAL SIGNS:  Afebrile, stable vital signs. HEENT:  The patient is normocephalic.  Pupils equal, round, react to light and accommodation.  Extraocular movements are intact.   Sclerae and conjunctivae to clear.  Oropharynx clear. NECK:  Without thyromegaly. BREASTS:  Bilateral reduction scars.  No masses. LUNGS:  Clear. HEART:  Regular rate and rhythm.  No murmurs, gallops. ABDOMEN:  Benign.  No masses, organomegaly or tenderness. PELVIC:  Normal external genitalia.  Vaginal mucosa is clear.  Cervix unremarkable.  Uterus upper limits normal size, moderately tender.  Adnexa unremarkable. EXTREMITIES:  Trace edema. NEUROLOGIC:  Grossly normal limits.  IMPRESSION: 1.  Previous NovaSure ablation with continued abnormal bleeding and pain with possible continued endometrial presence and associated intrauterine clot. 2.  Menstrual migraines. 3.  Anxiety and depression. 4.  History of seizure disorders.  PLAN:  The patient to undergo a laparoscopic-assisted vaginal hysterectomy with both fallopian tubes.  The nature of the procedure have been discussed.  The risks have been explained including the risk of infection.  Risk of hemorrhage that could require  transfusion with the risk of AIDS or hepatitis.  Risk of injury to adjacent organs including bladder, bowel, ureters that could require further exploratory surgery.  The risk of deep venous thrombosis and pulmonary embolus.  The patient expressed  understanding indications, risks and alternatives.  TN/NUANCE  D:09/03/2018 T:09/03/2018 JOB:[id number]

## 2018-09-12 NOTE — Patient Instructions (Addendum)
ERNA BROSSARD  09/12/2018      Your procedure is scheduled on   09-22-18    Report to [place]  at    [facility]   Call this number if you have problems the morning of surgery:[phone]   OUR ADDRESS IS [address], WE ARE LOCATED IN THE [facility]ICAL PLA[facility] WITH [facility].   Remember:  Do not eat food or drink liquids after midnight.  Take these medicines the morning of surgery with A SIP OF WATER  Wellbutrin, lamictal    Do not wear jewelry, make-up or nail polish.  Do not wear lotions, powders, or perfumes, or deoderant.  Do not shave 48 hours prior to surgery.  Men may shave face and neck.  Do not bring valuables to the hospital.  [facility] is not responsible for any belongings or valuables.  Contacts, dentures or bridgework may not be worn into surgery.  Leave your suitcase in the car.  After surgery it may be brought to your room.  For patients admitted to the hospital, discharge time will be determined by your treatment team.  Patients discharged the day of surgery will not be allowed to drive home.   Special instructions:    Please read over the following fact sheets that you were given:       [facility] - Preparing for Surgery Before surgery, you can play an important role.  Because skin is not sterile, your skin needs to be as free of germs as possible.  You can reduce the number of germs on your skin by washing with CHG (chlorahexidine gluconate) soap before surgery.  CHG is an antiseptic cleaner which kills germs and bonds with the skin to continue killing germs even after washing. Please DO NOT use if you have an allergy to CHG or antibacterial soaps.  If your skin becomes reddened/irritated stop using the CHG and inform your nurse when you arrive at Short Stay. Do not shave (including legs and underarms) for at least 48 hours prior to the first CHG shower.  You may shave your face/neck. Please follow these instructions  carefully:  1.  Shower with CHG Soap the night before surgery and the  morning of Surgery.  2.  If you choose to wash your hair, wash your hair first as usual with your  normal  shampoo.  3.  After you shampoo, rinse your hair and body thoroughly to remove the  shampoo.                           4.  Use CHG as you would any other liquid soap.  You can apply chg directly  to the skin and wash                       Gently with a scrungie or clean washcloth.  5.  Apply the CHG Soap to your body ONLY FROM THE NECK DOWN.   Do not use on face/ open                           Wound or open sores. Avoid contact with eyes, ears mouth and genitals (private parts).                       Wash face,  Genitals (private parts) with your normal soap.  6.  Wash thoroughly, paying special attention to the area where your surgery  will be performed.  7.  Thoroughly rinse your body with warm water from the neck down.  8.  DO NOT shower/wash with your normal soap after using and rinsing off  the CHG Soap.                9.  Pat yourself dry with a clean towel.            10.  Wear clean pajamas.            11.  Place clean sheets on your bed the night of your first shower and do not  sleep with pets. Day of Surgery : Do not apply any lotions/deodorants the morning of surgery.  Please wear clean clothes to the hospital/surgery center.  FAILURE TO FOLLOW THESE INSTRUCTIONS MAY RESULT IN THE CANCELLATION OF YOUR SURGERY PATIENT SIGNATURE_________________________________  NURSE SIGNATURE__________________________________  ________________________________________________________________________

## 2018-09-15 ENCOUNTER — Other Ambulatory Visit: Payer: Self-pay

## 2018-09-15 ENCOUNTER — Encounter (HOSPITAL_COMMUNITY): Payer: Self-pay

## 2018-09-15 ENCOUNTER — Encounter (HOSPITAL_COMMUNITY)
Admission: RE | Admit: 2018-09-15 | Discharge: 2018-09-15 | Disposition: A | Discharge status: Home / Self Care | Payer: No Typology Code available for payment source | Source: Ambulatory Visit | Attending: Obstetrics and Gynecology | Admitting: Obstetrics and Gynecology

## 2018-09-15 DIAGNOSIS — Z01812 Encounter for preprocedural laboratory examination: Secondary | ICD-10-CM | POA: Insufficient documentation

## 2018-09-15 DIAGNOSIS — R102 Pelvic and perineal pain: Secondary | ICD-10-CM | POA: Diagnosis not present

## 2018-09-15 DIAGNOSIS — N939 Abnormal uterine and vaginal bleeding, unspecified: Secondary | ICD-10-CM | POA: Diagnosis not present

## 2018-09-15 HISTORY — DX: Cardiac murmur, unspecified: R01.1

## 2018-09-15 HISTORY — DX: Prediabetes: R73.03

## 2018-09-15 LAB — CBC
HEMATOCRIT: 40.4 % (ref 36.0–46.0)
HEMOGLOBIN: 12.7 g/dL (ref 12.0–15.0)
MCH: 26.8 pg (ref 26.0–34.0)
MCHC: 31.4 g/dL (ref 30.0–36.0)
MCV: 85.2 fL (ref 80.0–100.0)
Platelets: 316 10*3/uL (ref 150–400)
RBC: 4.74 MIL/uL (ref 3.87–5.11)
RDW: 14 % (ref 11.5–15.5)
WBC: 7 10*3/uL (ref 4.0–10.5)
nRBC: 0 % (ref 0.0–0.2)

## 2018-09-15 LAB — ABO/RH: ABO/RH(D): O NEG

## 2018-09-15 LAB — BASIC METABOLIC PANEL
Anion gap: 7 (ref 5–15)
BUN: 13 mg/dL (ref 6–20)
CALCIUM: 9 mg/dL (ref 8.9–10.3)
CHLORIDE: 107 mmol/L (ref 98–111)
CO2: 24 mmol/L (ref 22–32)
CREATININE: 0.75 mg/dL (ref 0.44–1.00)
Glucose, Bld: 105 mg/dL — ABNORMAL HIGH (ref 70–99)
Potassium: 3.9 mmol/L (ref 3.5–5.1)
SODIUM: 138 mmol/L (ref 135–145)

## 2018-09-15 LAB — HCG, SERUM, QUALITATIVE: PREG SERUM: NEGATIVE

## 2018-09-15 NOTE — Progress Notes (Signed)
Chart left with Dolores Lory for follow up

## 2018-09-16 LAB — HEMOGLOBIN A1C
Hgb A1c MFr Bld: 5.9 % — ABNORMAL HIGH (ref 4.8–5.6)
Mean Plasma Glucose: 123 mg/dL

## 2018-09-17 MED FILL — OXcarbazepine 300 MG TABS: 300 | 90 days supply | Qty: 90 | Fill #1

## 2018-09-17 MED FILL — OXYBUTYNIN CL ER 10 MG TAB: 10 | 90 days supply | Qty: 90 | Fill #4

## 2018-09-18 ENCOUNTER — Other Ambulatory Visit (HOSPITAL_COMMUNITY): Payer: 59

## 2018-09-21 NOTE — Anesthesia Preprocedure Evaluation (Addendum)
Anesthesia Evaluation  Patient identified by MRN, date of birth, ID band Patient awake    Reviewed: Allergy & Precautions, NPO status , Patient's Chart, lab work & pertinent test results  History of Anesthesia Complications (+) PONV and history of anesthetic complications  Airway Mallampati: I  TM Distance: >3 FB Neck ROM: Full    Dental  (+) Teeth Intact   Pulmonary asthma ,    breath sounds clear to auscultation       Cardiovascular negative cardio ROS   Rhythm:regular Rate:Normal     Neuro/Psych  Headaches, Anxiety  Neuromuscular disease    GI/Hepatic   Endo/Other  Morbid obesity  Renal/GU Renal disease     Musculoskeletal   Abdominal (+) + obese,   Peds  Hematology  (+) anemia ,   Anesthesia Other Findings   Reproductive/Obstetrics                           Anesthesia Physical  Anesthesia Plan  ASA: II  Anesthesia Plan: General   Post-op Pain Management:    Induction: Intravenous  PONV Risk Score and Plan: 3 and Ondansetron, Dexamethasone, Treatment may vary due to age or medical condition, Midazolam and TIVA  Airway Management Planned: LMA and Oral ETT  Additional Equipment:   Intra-op Plan:   Post-operative Plan: Extubation in OR  Informed Consent: I have reviewed the patients History and Physical, chart, labs and discussed the procedure including the risks, benefits and alternatives for the proposed anesthesia with the patient or authorized representative who has indicated his/her understanding and acceptance.     Plan Discussed with: CRNA, Anesthesiologist and Surgeon  Anesthesia Plan Comments: ( )      Anesthesia Quick Evaluation

## 2018-09-22 ENCOUNTER — Encounter (HOSPITAL_BASED_OUTPATIENT_CLINIC_OR_DEPARTMENT_OTHER): Payer: Self-pay

## 2018-09-22 ENCOUNTER — Other Ambulatory Visit: Payer: Self-pay

## 2018-09-22 ENCOUNTER — Encounter (HOSPITAL_COMMUNITY)
Admission: RE | Disposition: A | Discharge status: Home / Self Care | Payer: Self-pay | Source: Ambulatory Visit | Attending: Obstetrics and Gynecology

## 2018-09-22 ENCOUNTER — Observation Stay (HOSPITAL_BASED_OUTPATIENT_CLINIC_OR_DEPARTMENT_OTHER)
Admission: RE | Admit: 2018-09-22 | Discharge: 2018-09-23 | Disposition: A | Discharge status: Home / Self Care | Payer: No Typology Code available for payment source | Source: Ambulatory Visit | Attending: Obstetrics and Gynecology | Admitting: Obstetrics and Gynecology

## 2018-09-22 ENCOUNTER — Encounter: Payer: Self-pay | Admitting: Emergency Medicine

## 2018-09-22 ENCOUNTER — Ambulatory Visit (HOSPITAL_BASED_OUTPATIENT_CLINIC_OR_DEPARTMENT_OTHER): Payer: No Typology Code available for payment source | Admitting: Anesthesiology

## 2018-09-22 DIAGNOSIS — N800 Endometriosis of uterus: Secondary | ICD-10-CM | POA: Diagnosis not present

## 2018-09-22 DIAGNOSIS — F419 Anxiety disorder, unspecified: Secondary | ICD-10-CM | POA: Diagnosis not present

## 2018-09-22 DIAGNOSIS — R102 Pelvic and perineal pain: Secondary | ICD-10-CM | POA: Insufficient documentation

## 2018-09-22 DIAGNOSIS — Z79899 Other long term (current) drug therapy: Secondary | ICD-10-CM | POA: Diagnosis not present

## 2018-09-22 DIAGNOSIS — Z881 Allergy status to other antibiotic agents status: Secondary | ICD-10-CM | POA: Insufficient documentation

## 2018-09-22 DIAGNOSIS — Z5331 Laparoscopic surgical procedure converted to open procedure: Secondary | ICD-10-CM | POA: Insufficient documentation

## 2018-09-22 DIAGNOSIS — Z880 Allergy status to penicillin: Secondary | ICD-10-CM | POA: Diagnosis not present

## 2018-09-22 DIAGNOSIS — J45909 Unspecified asthma, uncomplicated: Secondary | ICD-10-CM | POA: Diagnosis not present

## 2018-09-22 DIAGNOSIS — Z885 Allergy status to narcotic agent status: Secondary | ICD-10-CM | POA: Insufficient documentation

## 2018-09-22 DIAGNOSIS — G43829 Menstrual migraine, not intractable, without status migrainosus: Secondary | ICD-10-CM | POA: Diagnosis not present

## 2018-09-22 DIAGNOSIS — Z6835 Body mass index (BMI) 35.0-35.9, adult: Secondary | ICD-10-CM | POA: Diagnosis not present

## 2018-09-22 DIAGNOSIS — N920 Excessive and frequent menstruation with regular cycle: Secondary | ICD-10-CM | POA: Diagnosis not present

## 2018-09-22 DIAGNOSIS — F329 Major depressive disorder, single episode, unspecified: Secondary | ICD-10-CM | POA: Insufficient documentation

## 2018-09-22 DIAGNOSIS — Z90710 Acquired absence of both cervix and uterus: Secondary | ICD-10-CM | POA: Diagnosis present

## 2018-09-22 DIAGNOSIS — G40909 Epilepsy, unspecified, not intractable, without status epilepticus: Secondary | ICD-10-CM | POA: Diagnosis not present

## 2018-09-22 DIAGNOSIS — Z888 Allergy status to other drugs, medicaments and biological substances status: Secondary | ICD-10-CM | POA: Diagnosis not present

## 2018-09-22 HISTORY — PX: LAPAROSCOPIC VAGINAL HYSTERECTOMY WITH SALPINGECTOMY: SHX6680

## 2018-09-22 LAB — TYPE AND SCREEN
ABO/RH(D): O NEG
Antibody Screen: NEGATIVE

## 2018-09-22 SURGERY — HYSTERECTOMY, VAGINAL, LAPAROSCOPY-ASSISTED, WITH SALPINGECTOMY
Anesthesia: General | Site: Abdomen | Laterality: Bilateral

## 2018-09-22 MED ORDER — ONDANSETRON HCL 4 MG/2ML IJ SOLN
4.0000 mg | Freq: Four times a day (QID) | INTRAMUSCULAR | Status: DC | PRN
  Filled 2018-09-22: qty 2

## 2018-09-22 MED ORDER — FENTANYL 40 MCG/ML IV SOLN
INTRAVENOUS | Status: DC
  Administered 2018-09-22: 30 ug via INTRAVENOUS
  Administered 2018-09-22: 13:00:00 via INTRAVENOUS
  Administered 2018-09-22: 80 ug via INTRAVENOUS
  Administered 2018-09-23: 30 ug via INTRAVENOUS
  Administered 2018-09-23 (×2): 0 ug via INTRAVENOUS
  Filled 2018-09-22: qty 25

## 2018-09-22 MED ORDER — PROPOFOL 10 MG/ML IV BOLUS
INTRAVENOUS | Status: AC
  Filled 2018-09-22: qty 40

## 2018-09-22 MED ORDER — MIDAZOLAM HCL 2 MG/2ML IJ SOLN
INTRAMUSCULAR | Status: AC
  Filled 2018-09-22: qty 2

## 2018-09-22 MED ORDER — ONDANSETRON HCL 4 MG/2ML IJ SOLN
INTRAMUSCULAR | Status: AC
  Filled 2018-09-22: qty 2

## 2018-09-22 MED ORDER — NALOXONE HCL 0.4 MG/ML IJ SOLN: 0.4000 mg | INTRAMUSCULAR | Status: DC | PRN

## 2018-09-22 MED ORDER — PROMETHAZINE HCL 25 MG/ML IJ SOLN
6.2500 mg | Freq: Once | INTRAMUSCULAR | Status: AC
  Administered 2018-09-22: 6.25 mg via INTRAVENOUS
  Filled 2018-09-22: qty 1

## 2018-09-22 MED ORDER — ONDANSETRON HCL 4 MG PO TABS: 4.0000 mg | ORAL_TABLET | Freq: Four times a day (QID) | ORAL | Status: DC | PRN

## 2018-09-22 MED ORDER — ONDANSETRON HCL 4 MG/2ML IJ SOLN
4.0000 mg | Freq: Once | INTRAMUSCULAR | Status: AC | PRN
  Administered 2018-09-22: 4 mg via INTRAVENOUS
  Filled 2018-09-22: qty 2

## 2018-09-22 MED ORDER — BUPROPION HCL ER (XL) 300 MG PO TB24
300.0000 mg | ORAL_TABLET | Freq: Every day | ORAL | Status: DC
  Filled 2018-09-22: qty 1

## 2018-09-22 MED ORDER — ROCURONIUM BROMIDE 100 MG/10ML IV SOLN
INTRAVENOUS | Status: DC | PRN
  Administered 2018-09-22: 50 mg via INTRAVENOUS
  Administered 2018-09-22 (×2): 10 mg via INTRAVENOUS
  Administered 2018-09-22: 20 mg via INTRAVENOUS

## 2018-09-22 MED ORDER — OXYCODONE HCL 5 MG PO TABS
5.0000 mg | ORAL_TABLET | Freq: Once | ORAL | Status: DC | PRN
  Filled 2018-09-22: qty 1

## 2018-09-22 MED ORDER — FENTANYL CITRATE (PF) 100 MCG/2ML IJ SOLN
INTRAMUSCULAR | Status: AC
  Filled 2018-09-22: qty 2

## 2018-09-22 MED ORDER — ACETAMINOPHEN 10 MG/ML IV SOLN
INTRAVENOUS | Status: DC | PRN
  Administered 2018-09-22: 1000 mg via INTRAVENOUS

## 2018-09-22 MED ORDER — KETOROLAC TROMETHAMINE 30 MG/ML IJ SOLN
INTRAMUSCULAR | Status: AC
  Filled 2018-09-22: qty 1

## 2018-09-22 MED ORDER — SODIUM CHLORIDE 0.9% FLUSH: 9.0000 mL | INTRAVENOUS | Status: DC | PRN

## 2018-09-22 MED ORDER — ACETAMINOPHEN 10 MG/ML IV SOLN
INTRAVENOUS | Status: AC
  Filled 2018-09-22: qty 100

## 2018-09-22 MED ORDER — PROMETHAZINE HCL 25 MG/ML IJ SOLN
INTRAMUSCULAR | Status: AC
  Filled 2018-09-22: qty 1

## 2018-09-22 MED ORDER — OXYCODONE HCL 5 MG/5ML PO SOLN
5.0000 mg | Freq: Once | ORAL | Status: DC | PRN
  Filled 2018-09-22: qty 5

## 2018-09-22 MED ORDER — BUPIVACAINE HCL (PF) 0.25 % IJ SOLN
INTRAMUSCULAR | Status: DC | PRN
  Administered 2018-09-22: 30 mL

## 2018-09-22 MED ORDER — GENTAMICIN SULFATE 40 MG/ML IJ SOLN
INTRAVENOUS | Status: AC
  Administered 2018-09-22: 338.5 mg via INTRAVENOUS
  Filled 2018-09-22: qty 8.5

## 2018-09-22 MED ORDER — FENTANYL CITRATE (PF) 100 MCG/2ML IJ SOLN
INTRAMUSCULAR | Status: DC | PRN
  Administered 2018-09-22: 100 ug via INTRAVENOUS
  Administered 2018-09-22: 50 ug via INTRAVENOUS
  Administered 2018-09-22: 25 ug via INTRAVENOUS
  Administered 2018-09-22: 50 ug via INTRAVENOUS
  Administered 2018-09-22: 25 ug via INTRAVENOUS

## 2018-09-22 MED ORDER — LIDOCAINE 2% (20 MG/ML) 5 ML SYRINGE
INTRAMUSCULAR | Status: AC
  Filled 2018-09-22: qty 5

## 2018-09-22 MED ORDER — TRAMADOL HCL 50 MG PO TABS
50.0000 mg | ORAL_TABLET | Freq: Four times a day (QID) | ORAL | Status: DC | PRN
  Administered 2018-09-22 – 2018-09-23 (×2): 50 mg via ORAL
  Filled 2018-09-22 (×2): qty 1

## 2018-09-22 MED ORDER — MENTHOL 3 MG MT LOZG: 1.0000 | LOZENGE | OROMUCOSAL | Status: DC | PRN

## 2018-09-22 MED ORDER — ACETAMINOPHEN 325 MG PO TABS: 650.0000 mg | ORAL_TABLET | ORAL | Status: DC | PRN

## 2018-09-22 MED ORDER — ACETAMINOPHEN 325 MG PO TABS
325.0000 mg | ORAL_TABLET | ORAL | Status: DC | PRN
  Filled 2018-09-22: qty 2

## 2018-09-22 MED ORDER — KETOROLAC TROMETHAMINE 30 MG/ML IJ SOLN
INTRAMUSCULAR | Status: DC | PRN
  Administered 2018-09-22: 30 mg via INTRAVENOUS

## 2018-09-22 MED ORDER — DEXAMETHASONE SODIUM PHOSPHATE 10 MG/ML IJ SOLN
INTRAMUSCULAR | Status: AC
  Filled 2018-09-22: qty 1

## 2018-09-22 MED ORDER — FENTANYL CITRATE (PF) 250 MCG/5ML IJ SOLN
INTRAMUSCULAR | Status: AC
  Filled 2018-09-22: qty 5

## 2018-09-22 MED ORDER — PROPOFOL 10 MG/ML IV BOLUS
INTRAVENOUS | Status: DC | PRN
  Administered 2018-09-22: 200 mg via INTRAVENOUS

## 2018-09-22 MED ORDER — LACTATED RINGERS IV SOLN
INTRAVENOUS | Status: DC
  Administered 2018-09-22: 1000 mL via INTRAVENOUS
  Administered 2018-09-22 (×2): via INTRAVENOUS
  Filled 2018-09-22: qty 1000

## 2018-09-22 MED ORDER — SUGAMMADEX SODIUM 200 MG/2ML IV SOLN
INTRAVENOUS | Status: AC
  Filled 2018-09-22: qty 2

## 2018-09-22 MED ORDER — ONDANSETRON HCL 4 MG/2ML IJ SOLN
4.0000 mg | Freq: Four times a day (QID) | INTRAMUSCULAR | Status: DC | PRN
  Administered 2018-09-22: 4 mg via INTRAVENOUS

## 2018-09-22 MED ORDER — DOCUSATE SODIUM 100 MG PO CAPS
100.0000 mg | ORAL_CAPSULE | Freq: Two times a day (BID) | ORAL | Status: DC
  Administered 2018-09-22 (×2): 100 mg via ORAL
  Filled 2018-09-22 (×2): qty 1

## 2018-09-22 MED ORDER — SUGAMMADEX SODIUM 200 MG/2ML IV SOLN
INTRAVENOUS | Status: DC | PRN
  Administered 2018-09-22: 200 mg via INTRAVENOUS

## 2018-09-22 MED ORDER — MEPERIDINE HCL 25 MG/ML IJ SOLN
6.2500 mg | INTRAMUSCULAR | Status: DC | PRN
  Filled 2018-09-22: qty 1

## 2018-09-22 MED ORDER — LIDOCAINE HCL (CARDIAC) PF 100 MG/5ML IV SOSY
PREFILLED_SYRINGE | INTRAVENOUS | Status: DC | PRN
  Administered 2018-09-22: 80 mg via INTRAVENOUS

## 2018-09-22 MED ORDER — ALBUTEROL SULFATE (2.5 MG/3ML) 0.083% IN NEBU: 3.0000 mL | INHALATION_SOLUTION | Freq: Four times a day (QID) | RESPIRATORY_TRACT | Status: DC | PRN

## 2018-09-22 MED ORDER — MIDAZOLAM HCL 5 MG/5ML IJ SOLN
INTRAMUSCULAR | Status: DC | PRN
  Administered 2018-09-22: 2 mg via INTRAVENOUS

## 2018-09-22 MED ORDER — DIPHENHYDRAMINE HCL 50 MG/ML IJ SOLN: 12.5000 mg | Freq: Four times a day (QID) | INTRAMUSCULAR | Status: DC | PRN

## 2018-09-22 MED ORDER — FENTANYL CITRATE (PF) 100 MCG/2ML IJ SOLN
25.0000 ug | INTRAMUSCULAR | Status: DC | PRN
  Administered 2018-09-22 (×2): 50 ug via INTRAVENOUS
  Filled 2018-09-22: qty 1

## 2018-09-22 MED ORDER — BUPIVACAINE HCL (PF) 0.25 % IJ SOLN
INTRAMUSCULAR | Status: AC
  Filled 2018-09-22: qty 30

## 2018-09-22 MED ORDER — DEXAMETHASONE SODIUM PHOSPHATE 4 MG/ML IJ SOLN
INTRAMUSCULAR | Status: DC | PRN
  Administered 2018-09-22: 10 mg via INTRAVENOUS

## 2018-09-22 MED ORDER — LACTATED RINGERS IV SOLN
INTRAVENOUS | Status: DC
  Administered 2018-09-22: 19:00:00 via INTRAVENOUS

## 2018-09-22 MED ORDER — SODIUM CHLORIDE 0.9 % IR SOLN
Status: DC | PRN
  Administered 2018-09-22: 1000 mL via INTRAVESICAL

## 2018-09-22 MED ORDER — KETOROLAC TROMETHAMINE 15 MG/ML IJ SOLN
15.0000 mg | Freq: Four times a day (QID) | INTRAMUSCULAR | Status: DC
  Administered 2018-09-22 – 2018-09-23 (×4): 15 mg via INTRAVENOUS
  Filled 2018-09-22 (×4): qty 1

## 2018-09-22 MED ORDER — LAMOTRIGINE 100 MG PO TABS
200.0000 mg | ORAL_TABLET | Freq: Every day | ORAL | Status: DC
  Filled 2018-09-22: qty 2

## 2018-09-22 MED ORDER — ONDANSETRON HCL 4 MG/2ML IJ SOLN
INTRAMUSCULAR | Status: DC | PRN
  Administered 2018-09-22: 4 mg via INTRAVENOUS

## 2018-09-22 MED ORDER — ACETAMINOPHEN 160 MG/5ML PO SOLN
325.0000 mg | ORAL | Status: DC | PRN
  Filled 2018-09-22: qty 20.3

## 2018-09-22 MED ORDER — DIPHENHYDRAMINE HCL 12.5 MG/5ML PO ELIX: 12.5000 mg | ORAL_SOLUTION | Freq: Four times a day (QID) | ORAL | Status: DC | PRN

## 2018-09-22 MED ORDER — OXCARBAZEPINE 300 MG PO TABS
300.0000 mg | ORAL_TABLET | Freq: Every day | ORAL | Status: DC
  Administered 2018-09-22: 300 mg via ORAL
  Filled 2018-09-22: qty 1

## 2018-09-22 MED ORDER — ROCURONIUM BROMIDE 10 MG/ML (PF) SYRINGE
PREFILLED_SYRINGE | INTRAVENOUS | Status: AC
  Filled 2018-09-22: qty 10

## 2018-09-22 SURGICAL SUPPLY — 75 items
ADH SKN CLS APL DERMABOND .7 (GAUZE/BANDAGES/DRESSINGS) ×1
APL SRG 38 LTWT LNG FL B (MISCELLANEOUS) ×1
APPLICATOR ARISTA FLEXITIP XL (MISCELLANEOUS) ×1 IMPLANT
BAG RETRIEVAL 10 (BASKET)
BLADE CLIPPER SURG (BLADE) IMPLANT
CANISTER SUCT 3000ML PPV (MISCELLANEOUS) ×3 IMPLANT
CATH ROBINSON RED A/P 16FR (CATHETERS) ×2 IMPLANT
COVER BACK TABLE 60X90IN (DRAPES) ×2 IMPLANT
COVER MAYO STAND STRL (DRAPES) ×4 IMPLANT
COVER SURGICAL LIGHT HANDLE (MISCELLANEOUS) IMPLANT
DERMABOND ADVANCED (GAUZE/BANDAGES/DRESSINGS) ×1
DERMABOND ADVANCED .7 DNX12 (GAUZE/BANDAGES/DRESSINGS) ×1 IMPLANT
DRSG COVADERM PLUS 2X2 (GAUZE/BANDAGES/DRESSINGS) IMPLANT
DRSG OPSITE POSTOP 3X4 (GAUZE/BANDAGES/DRESSINGS) ×1 IMPLANT
DRSG OPSITE POSTOP 4X10 (GAUZE/BANDAGES/DRESSINGS) ×1 IMPLANT
DURAPREP 26ML APPLICATOR (WOUND CARE) ×2 IMPLANT
ELECT REM PT RETURN 9FT ADLT (ELECTROSURGICAL) ×2
ELECTRODE REM PT RTRN 9FT ADLT (ELECTROSURGICAL) ×1 IMPLANT
GAUZE 4X4 16PLY RFD (DISPOSABLE) ×2 IMPLANT
GLOVE BIO SURGEON STRL SZ 6.5 (GLOVE) ×3 IMPLANT
GLOVE BIO SURGEON STRL SZ7 (GLOVE) ×6 IMPLANT
GLOVE BIOGEL PI IND STRL 6.5 (GLOVE) IMPLANT
GLOVE BIOGEL PI IND STRL 7.0 (GLOVE) IMPLANT
GLOVE BIOGEL PI INDICATOR 6.5 (GLOVE) ×2
GLOVE BIOGEL PI INDICATOR 7.0 (GLOVE) ×2
GLOVE ECLIPSE 6.5 STRL STRAW (GLOVE) ×1 IMPLANT
GLOVE SURG SS PI 6.5 STRL IVOR (GLOVE) ×2 IMPLANT
HEMOSTAT ARISTA ABSORB 3G PWDR (MISCELLANEOUS) ×1 IMPLANT
HOLDER FOLEY CATH W/STRAP (MISCELLANEOUS) ×2 IMPLANT
KIT TURNOVER CYSTO (KITS) ×2 IMPLANT
NEEDLE INSUFFLATION 120MM (ENDOMECHANICALS) ×1 IMPLANT
NS IRRIG 500ML POUR BTL (IV SOLUTION) ×2 IMPLANT
PACK LAVH (CUSTOM PROCEDURE TRAY) ×2 IMPLANT
PACK ROBOTIC GOWN (GOWN DISPOSABLE) IMPLANT
PACK TRENDGUARD 450 HYBRID PRO (MISCELLANEOUS) IMPLANT
PAD OB MATERNITY 4.3X12.25 (PERSONAL CARE ITEMS) ×2 IMPLANT
PAD PREP 24X48 CUFFED NSTRL (MISCELLANEOUS) ×2 IMPLANT
PENCIL BUTTON HOLSTER BLD 10FT (ELECTRODE) ×1 IMPLANT
SCISSORS LAP 5X45 EPIX DISP (ENDOMECHANICALS) IMPLANT
SEALER TISSUE G2 CVD JAW 45CM (ENDOMECHANICALS) ×2 IMPLANT
SET CYSTO W/LG BORE CLAMP LF (SET/KITS/TRAYS/PACK) ×1 IMPLANT
SET IRRIG TUBING LAPAROSCOPIC (IRRIGATION / IRRIGATOR) ×2 IMPLANT
SET IRRIG Y TYPE TUR BLADDER L (SET/KITS/TRAYS/PACK) IMPLANT
SOLUTION ELECTROLUBE (MISCELLANEOUS) IMPLANT
STRIP CLOSURE SKIN 1/4X4 (GAUZE/BANDAGES/DRESSINGS) IMPLANT
SUT PDS AB 0 CT1 36 (SUTURE) ×2 IMPLANT
SUT VIC AB 0 CT1 18XCR BRD8 (SUTURE) ×2 IMPLANT
SUT VIC AB 0 CT1 36 (SUTURE) ×6 IMPLANT
SUT VIC AB 0 CT1 8-18 (SUTURE) ×8
SUT VIC AB 2-0 CT1 (SUTURE) IMPLANT
SUT VIC AB 2-0 SH 27 (SUTURE)
SUT VIC AB 2-0 SH 27XBRD (SUTURE) IMPLANT
SUT VIC AB 3-0 SH 27 (SUTURE)
SUT VIC AB 3-0 SH 27X BRD (SUTURE) IMPLANT
SUT VIC AB 4-0 PS2 27 (SUTURE) ×1 IMPLANT
SUT VICRYL 0 TIES 12 18 (SUTURE) ×1 IMPLANT
SUT VICRYL 0 UR6 27IN ABS (SUTURE) IMPLANT
SUT VICRYL 1 TIES 12X18 (SUTURE) ×2 IMPLANT
SUT VICRYL 4-0 PS2 18IN ABS (SUTURE) ×2 IMPLANT
SYR BULB IRRIGATION 50ML (SYRINGE) IMPLANT
SYS BAG RETRIEVAL 10MM (BASKET)
SYSTEM BAG RETRIEVAL 10MM (BASKET) IMPLANT
TOWEL OR 17X24 6PK STRL BLUE (TOWEL DISPOSABLE) ×3 IMPLANT
TRAY FOLEY W/BAG SLVR 14FR (SET/KITS/TRAYS/PACK) ×2 IMPLANT
TRENDGUARD 450 HYBRID PRO PACK (MISCELLANEOUS) ×2
TROCAR BALLN 12MMX100 BLUNT (TROCAR) ×1 IMPLANT
TROCAR BLADELESS OPT 12M 100M (ENDOMECHANICALS) IMPLANT
TROCAR OPTI TIP 5M 100M (ENDOMECHANICALS) ×2 IMPLANT
TROCAR XCEL DIL TIP R 11M (ENDOMECHANICALS) IMPLANT
TUBE CONNECTING 12X1/4 (SUCTIONS) ×1 IMPLANT
TUBING INSUF HEATED (TUBING) ×2 IMPLANT
TUBING SUCTION BULK 100 FT (MISCELLANEOUS) IMPLANT
WARMER LAPAROSCOPE (MISCELLANEOUS) ×2 IMPLANT
WATER STERILE IRR 500ML POUR (IV SOLUTION) ×1 IMPLANT
YANKAUER SUCT BULB TIP NO VENT (SUCTIONS) IMPLANT

## 2018-09-22 NOTE — Brief Op Note (Signed)
09/22/2018  10:09 AM  PATIENT:  Margaret [name]  42 y.o. female  PRE-OPERATIVE DIAGNOSIS:  pelvic pain, AUB  POST-OPERATIVE DIAGNOSIS:  pelvic pain, AUB  PROCEDURE:  Procedure(s) with comments: ABDOMINAL HYSTERECTOMY WITH BILATERAL SALPINGECTOMY (Bilateral) - need bedL;  CONVERTED TO OPEN ABDOMINAL HYSTERECTOMY AT 0840  SURGEON:  Surgeon(s) and Role:    * Arvella Nigh, MD - Primary  PHYSICIAN ASSISTANT:   ASSISTANTS: atkins    ANESTHESIA:   local and general  EBL:  600 mL   BLOOD ADMINISTERED:none  DRAINS: Urinary Catheter (Foley)   LOCAL MEDICATIONS USED:  MARCAINE     SPECIMEN:  Source of Specimen:  uterus and both fallopian tubes  DISPOSITION OF SPECIMEN:  PATHOLOGY  COUNTS:  YES  TOURNIQUET:  * No tourniquets in log *  DICTATION: .Other Dictation: Dictation Number [id number]  PLAN OF CARE: Admit for overnight observation  PATIENT DISPOSITION:  PACU - hemodynamically stable.   Delay start of Pharmacological VTE agent (>24hrs) due to surgical blood loss or risk of bleeding: no

## 2018-09-22 NOTE — Anesthesia Postprocedure Evaluation (Signed)
Anesthesia Post Note  Patient: Margaret [name]  Procedure(s) Performed: ABDOMINAL HYSTERECTOMY WITH BILATERAL SALPINGECTOMY (Bilateral Abdomen)     Patient location during evaluation: PACU Anesthesia Type: General Level of consciousness: awake and alert Pain management: pain level controlled Vital Signs Assessment: post-procedure vital signs reviewed and stable Respiratory status: spontaneous breathing, nonlabored ventilation, respiratory function stable and patient connected to nasal cannula oxygen Cardiovascular status: stable and blood pressure returned to baseline Postop Assessment: no apparent nausea or vomiting Anesthetic complications: no    Last Vitals:  Vitals:   09/22/18 1145 09/22/18 1200  BP: 121/79 118/84  Pulse: 75 80  Resp: 17 14  Temp:    SpO2: 99% 100%    Last Pain:  Vitals:   09/22/18 1145  TempSrc:   PainSc: 5                  Yolani Vo

## 2018-09-22 NOTE — H&P (Signed)
  History and physical exam unchanged 

## 2018-09-22 NOTE — Op Note (Signed)
NAMEAVRI, PAIVA MEDICAL RECORD [id number] ACCOUNT [id number] DATE OF BIRTH:03/14/1976 FACILITY[facility] LOCATION: [facility] PHYSICIAN:Arianah Torgeson Sherran Needs, MD  OPERATIVE REPORT  DATE OF PROCEDURE:  09/22/2018  PREOPERATIVE DIAGNOSES:  Menorrhagia and pelvic pain.  POSTOPERATIVE DIAGNOSES:  Menorrhagia and pelvic pain in addition to significant scarring in the bladder flap area.  PROCEDURES:  Diagnostic laparoscopy with attempted laparoscopic assisted vaginal hysterectomy followed by exploratory surgery with a total abdominal hysterectomy with removal of both fallopian tubes.  SURGEON:  Darlyn Chamber, MD  ASSISTANT:  Marylynn Pearson MD  ANESTHESIA:  General endotracheal.  ESTIMATED BLOOD LOSS:  500-600 mL.  PACKS:  None.  DRAINS:  Include urethral Foley.  INTRAOPERATIVE BLOOD PLACED:  None.  COMPLICATIONS:  None.  INDICATIONS:  As dictated in history and physical.  DESCRIPTION OF PROCEDURE:  The patient was taken to the OR, placed in supine position.  After satisfactory level of general endotracheal anesthesia was obtained, the patient was placed in the dorsal lithotomy position using the Allen stirrups.  Perineum  and vagina were prepped out with Betadine.  A Hulka tenaculum was put in place.  Bladder was then about catheterization.  Abdomen was prepped with DuraPrep.  After a period of time, the patient was draped in sterile field.  Subumbilical incision made  with a knife.  The Veress needle was then introduced in the abdominal cavity without difficulty.  The abdomen was inflated with approximately 3 liters of carbon dioxide.  A 10/11 trocar was put in place and laparoscope was introduced.  There was no  evidence of injury to adjacent organs.  Upper abdomen including liver tip the gallbladder, both lateral gutters were clear.  A 5 mm trocar was put in place in suprapubic area under direct visualization.  Using the EnSeal, we first identified the right  tube and ovary.  We  elevated the right tube.  We cauterized and incised mesenteric attachments of the right tube up to the uterus.  The uteroovarian vasculature was then cauterized and incised along with the right round ligament.  We then went to the  left side.  The left tube was elevated.  Mesenteric attachments to the tube were cauterized and incised using the EnSeal up to the uterus.  We then excised the tube and brought out through the subumbilical port and sent for pathology.  Next, the left  uteroovarian pedicles were cauterized and incised.  The left round ligament was cauterized and incised.  She had dense adhesions to the bladder area.  We left those in place.  The decision was to attempt to go vaginally at this point in time.  The patient's legs were repositioned and the Hulka tenaculum was then removed.  Weighted speculum was placed in the vaginal vault.  We grasped the cervix.  It was extremely well supported.  We incised the vaginal mucosa around the cervix using the  bipolar.  We began sharply dissecting the bladder superiorly.  We tried to enter the cul-de-sac.  We were unsuccessful.  We clamped and cut the uterosacral ligaments, suture ligated with 0 chromic.  These were held.  We then entered the cul-de-sac.  We  could not get the long nose weighted speculum in due to the length of vagina and her support.  At this point in time, decision was to go abdominally.  The single tooth tenaculum was then removed.  A Foley was placed to straight drain.  The patient's legs were repositioned.  A low transverse incision was made with a  knife, carried through subcutaneous tissue and anterior rectus fascia entered sharply  fascia _____.  The fascia taken off the muscle superiorly and inferiorly.  Rectus muscles were separated in the midline.  Peritoneum was entered sharply, incision in the peritoneum extended both superiorly and inferiorly.  She had some omental adhesions  to the upper abdomen.  These were taken down  easily.  At this point in time, an O'Connor-O'Sullivan self-retaining retractor was put in place.  The cervix was secured with 2 long Kellys.  We identified the bladder flap area and developed it with sharp  dissection.  It was densely adherent.  Next, using the clamp, cut tie technique with suture ligatures of 0 Vicryl, the parametrium was serially separated from the sides of the uterus down to the vaginal angles.  The vaginal angles were then clamped and  cut.  The uterus and cervix were passed off the operative field.  She also had the right fallopian tube.  At this point in time, both vaginal angles were identified.  We put in figure-of-eights of 0 Vicryl these were held.  The remaining vaginal mucosa  was reapproximated with interrupted sutures of 0 Vicryl.  Some areas of oozing were controlled with bipolar.  We identified the ovarian vasculature on each side.  We clamped and re-ligated both of these areas just to be safe.  At this point in time, we  had good hemostasis.  We irrigated the pelvis with good hemostasis.  Clear urine output.  We put Arista in place.  Peritoneum was closed with running locking suture of 0 Vicryl.  Fascia closed with a running suture of 0 PDS.  Subcu was closed with  interrupted subcuticulars of 4-0 Vicryl.  Skin was closed with running subcuticular 4-0 Vicryl.  Suprapubic incision closed with interrupted sutures, subcutaneous tissues of 4-0 Vicryl.  Dermabond was placed over the lower incision.  The patient's legs were repositioned.  Foley catheter had been removed.  A cystoscopy was performed.  There was no evidence of injury to the bladder.  Both ureteral orifices were noted to be spilling good clear jets of urine.  Cystoscope was removed.   Foley was placed back to straight drain.  The patient was taken out of dorsal lithotomy position once alert and extubated and transferred to recovery room in good condition.  Sponge, instrument and needle count was correct by  circulating nurse multiple  times.  AN/NUANCE  D:09/22/2018 T:09/22/2018 JOB:[id number]

## 2018-09-22 NOTE — Anesthesia Procedure Notes (Signed)
Procedure Name: Intubation Date/Time: 09/22/2018 7:36 AM Performed by: Bonney Aid, CRNA Pre-anesthesia Checklist: Patient identified, Emergency Drugs available, Suction available and Patient being monitored Patient Re-evaluated:Patient Re-evaluated prior to induction Oxygen Delivery Method: Circle system utilized Preoxygenation: Pre-oxygenation with 100% oxygen Induction Type: IV induction Ventilation: Mask ventilation without difficulty Laryngoscope Size: Mac and 3 Grade View: Grade I Tube type: Oral Tube size: 7.5 mm Number of attempts: 1 Airway Equipment and Method: Stylet and Oral airway Placement Confirmation: ETT inserted through vocal cords under direct vision,  positive ETCO2 and breath sounds checked- equal and bilateral Secured at: 21 cm Tube secured with: Tape Dental Injury: Teeth and Oropharynx as per pre-operative assessment

## 2018-09-22 NOTE — Progress Notes (Signed)
Patient ID: Margaret [name], female   DOB: [birth year], 42 y.o.   MRN: [id number] AF VSS DRESSING DRY GOOD UO MINIMAL VAGINAL BLEEDING

## 2018-09-22 NOTE — Transfer of Care (Signed)
Immediate Anesthesia Transfer of Care Note  Patient: Margaret [name]  Procedure(s) Performed: ABDOMINAL HYSTERECTOMY WITH BILATERAL SALPINGECTOMY (Bilateral Abdomen)  Patient Location: PACU  Anesthesia Type:General  Level of Consciousness: drowsy  Airway & Oxygen Therapy: Patient Spontanous Breathing and Patient connected to nasal cannula oxygen  Post-op Assessment: Report given to RN  Post vital signs: Reviewed and stable  Last Vitals:  Vitals Value Taken Time  BP 122/84 09/22/2018 10:11 AM  Temp    Pulse 91 09/22/2018 10:15 AM  Resp 17 09/22/2018 10:15 AM  SpO2 97 % 09/22/2018 10:15 AM  Vitals shown include unvalidated device data.  Last Pain:  Vitals:   09/22/18 0617  TempSrc: Oral  PainSc: 0-No pain      Patients Stated Pain Goal: 4 (39/58/44 1712)  Complications: No apparent anesthesia complications

## 2018-09-23 ENCOUNTER — Other Ambulatory Visit: Payer: Self-pay

## 2018-09-23 ENCOUNTER — Encounter (HOSPITAL_BASED_OUTPATIENT_CLINIC_OR_DEPARTMENT_OTHER): Payer: Self-pay | Admitting: Obstetrics and Gynecology

## 2018-09-23 DIAGNOSIS — N920 Excessive and frequent menstruation with regular cycle: Secondary | ICD-10-CM | POA: Diagnosis not present

## 2018-09-23 LAB — CBC
HCT: 31.5 % — ABNORMAL LOW (ref 36.0–46.0)
HEMOGLOBIN: 9.9 g/dL — AB (ref 12.0–15.0)
MCH: 26.9 pg (ref 26.0–34.0)
MCHC: 31.4 g/dL (ref 30.0–36.0)
MCV: 85.6 fL (ref 80.0–100.0)
PLATELETS: 284 10*3/uL (ref 150–400)
RBC: 3.68 MIL/uL — ABNORMAL LOW (ref 3.87–5.11)
RDW: 14 % (ref 11.5–15.5)
WBC: 9.5 10*3/uL (ref 4.0–10.5)
nRBC: 0 % (ref 0.0–0.2)

## 2018-09-23 MED ORDER — TRAMADOL HCL 50 MG PO TABS: 50.0000 mg | ORAL_TABLET | Freq: Four times a day (QID) | ORAL | 0 refills | Status: DC | PRN

## 2018-09-23 MED FILL — traMADol HCL 50 MG TABS: 50 | 5 days supply | Qty: 20 | Fill #0

## 2018-09-23 NOTE — Discharge Summary (Signed)
Patient name Margaret, [name] DICTATION# [id number] CSN# [id number]  Arvella Nigh, MD 09/23/2018 8:17 AM

## 2018-09-23 NOTE — Discharge Summary (Signed)
[name], [name] MEDICAL RECORD [id number] ACCOUNT [id number] DATE OF BIRTH:09/29/1976 FAC[facility]ATION[facility] PHYSICIAN:Ellias Mcelreath Sherran Needs, MD  DISCHARGE SUMMARY  DATE OF DISCHARGE:  09/23/2018  DISCHARGE DIAGNOSES:  Previous NovaSure ablation with continued abnormal bleeding and pain, possibly secondary to adenomyosis.  POSTOPERATIVE DIAGNOSIS:  Previous NovaSure ablation with continued abnormal bleeding and pain, possibly secondary to adenomyosis.  OPERATIVE PROCEDURE:  Attempted laparoscopic assisted vaginal hysterectomy with subsequent exploratory surgery with total abdominal hysterectomy with removal of both fallopian tubes and cystoscopy.  For a complete history and physical, please see  dictated note.  HOSPITAL COURSE:  The patient underwent above-noted surgery.  Postop did well.  Her postop hemoglobin was 9.9.  On her first postop day, she was afebrile.  She was tolerating a regular diet and was voiding without difficulty.  Her abdomen was soft.   Incisions were clear.  She had minimal vaginal spotting.  She will be discharged home at this time.  COMPLICATIONS:  None were encountered during stay in the hospital.  The patient was discharged home in stable condition.  DISPOSITION:  The patient to avoid heavy lifting, vaginal entrance or driving a car.  She was instructed to call should there be heavy vaginal bleeding.  Nausea, vomiting should be reported.  Excessive pain should be reported.  Any fever should be  reported.  Also instructed in signs and symptoms of deep venous thrombosis and pulmonary embolus.  Discharged home on tramadol as needed for pain.  Planned follow up in the office later this week.  LN/NUANCE D:09/23/2018 T:09/23/2018 JOB:[id number]

## 2018-09-23 NOTE — Progress Notes (Signed)
Pt was discharged home today. Instructions were reviewed with patient, and questions were answered. Pt was taken to main entrance via wheelchair by NT.  

## 2018-09-23 NOTE — Progress Notes (Signed)
1 Day Post-Op Procedure(s) (LRB): ABDOMINAL HYSTERECTOMY WITH BILATERAL SALPINGECTOMY (Bilateral)  Subjective: Patient reports tolerating PO and no problems voiding.    Objective: I have reviewed patient's vital signs, intake and output and labs.  General: alert GI: soft, non-tender; bowel sounds normal; no masses,  no organomegaly and incision: clean Vaginal Bleeding: minimal  Assessment: s/p Procedure(s) with comments: ABDOMINAL HYSTERECTOMY WITH BILATERAL SALPINGECTOMY (Bilateral) - need bedL;  CONVERTED TO OPEN ABDOMINAL HYSTERECTOMY AT 0840: stable  Plan: Discharge home  LOS: 0 days    Margaret [name] 09/23/2018, 8:13 AM

## 2018-10-01 ENCOUNTER — Ambulatory Visit
Admission: RE | Admit: 2018-10-01 | Discharge: 2018-10-01 | Disposition: A | Discharge status: Home / Self Care | Payer: 59 | Source: Ambulatory Visit | Attending: Obstetrics and Gynecology | Admitting: Obstetrics and Gynecology

## 2018-10-01 DIAGNOSIS — Z1231 Encounter for screening mammogram for malignant neoplasm of breast: Secondary | ICD-10-CM

## 2018-10-06 ENCOUNTER — Ambulatory Visit: Payer: No Typology Code available for payment source | Admitting: Physician Assistant

## 2018-10-06 ENCOUNTER — Encounter: Payer: Self-pay | Admitting: Physician Assistant

## 2018-10-06 DIAGNOSIS — F319 Bipolar disorder, unspecified: Secondary | ICD-10-CM | POA: Diagnosis not present

## 2018-10-06 MED ORDER — OXCARBAZEPINE 300 MG PO TABS: 300.0000 mg | ORAL_TABLET | Freq: Every day | ORAL | 1 refills | Status: DC

## 2018-10-06 MED ORDER — BUPROPION HCL ER (XL) 300 MG PO TB24: 300.0000 mg | ORAL_TABLET | Freq: Every day | ORAL | 1 refills | Status: DC

## 2018-10-06 MED ORDER — LAMOTRIGINE 100 MG PO TABS: 200.0000 mg | ORAL_TABLET | Freq: Every day | ORAL | 1 refills | Status: DC

## 2018-10-06 NOTE — Progress Notes (Signed)
[facility] Med Check  Patient ID: Margaret [name],  MRN: [id number]  PCP: Darcus Austin, MD  Date of Evaluation: 10/06/2018 Time spent:15 minutes  Chief Complaint:  Chief Complaint    Follow-up      HISTORY/CURRENT STATUS: HPI For 6 month med check.  Had a hysterectomy 2 weeks ago. Was going to have it laparoscopically but they had to do it open. "When they got in there, it was too bad so they had to open me up."  Patient denies loss of interest in usual activities and is able to enjoy things.  Denies decreased energy or motivation.  Appetite has not changed.  No extreme sadness, tearfulness, or feelings of hopelessness.  Denies any changes in concentration, making decisions or remembering things.  Denies suicidal or homicidal thoughts.  Patient denies increased energy with decreased need for sleep, no increased talkativeness, no racing thoughts, no impulsivity or risky behaviors, no increased spending, no increased libido, no grandiosity.  Anxiety is controlled. Not having PA.   Individual Medical History/ Review of Systems: Changes? :Yes  had hysterectomy 2 wks ago  Allergies: Dilaudid [hydromorphone]; Amoxicillin; Ancef [cefazolin]; Penicillins; Adhesive [tape]; and Keflex [cephalexin]  Current Medications:  Current Outpatient Medications:  .  albuterol (PROVENTIL HFA;VENTOLIN HFA) 108 (90 BASE) MCG/ACT inhaler, Inhale 2 puffs into the lungs every 6 (six) hours as needed for wheezing or shortness of breath., Disp: , Rfl:  .  Biotin 10 MG CAPS, Take by mouth., Disp: , Rfl:  .  buPROPion (WELLBUTRIN XL) 300 MG 24 hr tablet, Take 1 tablet (300 mg total) by mouth daily., Disp: 90 tablet, Rfl: 1 .  docusate sodium (COLACE) 100 MG capsule, Take 100 mg by mouth 2 (two) times daily., Disp: , Rfl:  .  ibuprofen (ADVIL,MOTRIN) 200 MG tablet, Take 600 mg by mouth every 6 (six) hours as needed for headache or mild pain., Disp: , Rfl:  .  lamoTRIgine (LAMICTAL) 100 MG tablet, Take 2  tablets (200 mg total) by mouth daily., Disp: 180 tablet, Rfl: 1 .  Oxcarbazepine (TRILEPTAL) 300 MG tablet, Take 1 tablet (300 mg total) by mouth at bedtime., Disp: 90 tablet, Rfl: 1 .  oxybutynin (DITROPAN-XL) 10 MG 24 hr tablet, Take 10 mg by mouth at bedtime., Disp: , Rfl:  .  sennosides-docusate sodium (SENOKOT-S) 8.6-50 MG tablet, Take 1 tablet by mouth 2 (two) times daily., Disp: , Rfl:  .  naproxen sodium (ANAPROX) 550 MG tablet, Take 550 mg by mouth every 8 (eight) hours as needed for mild pain., Disp: , Rfl:  .  traMADol (ULTRAM) 50 MG tablet, Take 1 tablet (50 mg total) by mouth every 6 (six) hours as needed. (Patient not taking: Reported on 10/06/2018), Disp: 20 tablet, Rfl: 0 Medication Side Effects: none  Family Medical/ Social History: Changes? No  MENTAL HEALTH EXAM:  Last menstrual period 09/19/2018.There is no height or weight on file to calculate BMI.  General Appearance: Well Groomed  Eye Contact:  Good  Speech:  Clear and Coherent  Volume:  Normal  Mood:  Euthymic  Affect:  Appropriate  Thought Process:  Goal Directed  Orientation:  Full (Time, Place, and Person)  Thought Content: Logical   Suicidal Thoughts:  No  Homicidal Thoughts:  No  Memory:  WNL  Judgement:  Good  Insight:  Good  Psychomotor Activity:  Normal  Concentration:  Concentration: Good  Recall:  Good  Fund of Knowledge: Good  Language: Good  Assets:  Desire for Improvement  ADL's:  Intact  Cognition: WNL  Prognosis:  Good    DIAGNOSES:    ICD-10-CM   1. Bipolar I disorder ([place]) F31.9     Receiving Psychotherapy: No    RECOMMENDATIONS: Continue all current meds. ROV in 6 months.    Donnal Moat, PA-C

## 2018-11-05 MED FILL — SUBVENITE 100 MG TABS: 100 | 90 days supply | Qty: 180 | Fill #1

## 2018-11-16 LAB — LIPID PANEL
Cholesterol: 231 — AB (ref 0–200)
HDL: 58 (ref 35–70)
LDL CALC: 145
TRIGLYCERIDES: 136 (ref 40–160)

## 2018-11-16 LAB — HEMOGLOBIN A1C: Hemoglobin A1C: 5.8

## 2018-11-16 LAB — BASIC METABOLIC PANEL
BUN: 9 (ref 4–21)
CREATININE: 0.8 (ref 0.5–1.1)
GLUCOSE: 99
Potassium: 4.3 (ref 3.4–5.3)
SODIUM: 138 (ref 137–147)

## 2018-11-26 HISTORY — PX: TRIGGER FINGER RELEASE: SHX641

## 2018-11-28 MED FILL — buPROPion HCL ER (XL) 300 M: 300 | 90 days supply | Qty: 90 | Fill #0

## 2018-12-01 MED FILL — OXcarbazepine 300 MG TABS: 300 | 90 days supply | Qty: 90 | Fill #0

## 2018-12-25 MED FILL — OXYBUTYNIN CL ER 10 MG TAB: 10 | 30 days supply | Qty: 30 | Fill #5

## 2018-12-31 ENCOUNTER — Encounter: Payer: Self-pay | Admitting: Family Medicine

## 2018-12-31 ENCOUNTER — Ambulatory Visit (INDEPENDENT_AMBULATORY_CARE_PROVIDER_SITE_OTHER): Payer: No Typology Code available for payment source | Admitting: Family Medicine

## 2018-12-31 VITALS — BP 104/78 | HR 74 | Temp 98.5°F | Ht 63.0 in | Wt 200.4 lb

## 2018-12-31 DIAGNOSIS — Z23 Encounter for immunization: Secondary | ICD-10-CM

## 2018-12-31 DIAGNOSIS — N393 Stress incontinence (female) (male): Secondary | ICD-10-CM

## 2018-12-31 DIAGNOSIS — F319 Bipolar disorder, unspecified: Secondary | ICD-10-CM | POA: Insufficient documentation

## 2018-12-31 DIAGNOSIS — F339 Major depressive disorder, recurrent, unspecified: Secondary | ICD-10-CM | POA: Insufficient documentation

## 2018-12-31 MED ORDER — OXYBUTYNIN CHLORIDE ER 10 MG PO TB24: 10.0000 mg | ORAL_TABLET | Freq: Every day | ORAL | 3 refills | Status: DC

## 2018-12-31 NOTE — Progress Notes (Signed)
Patient: Margaret [name] MRN: [id number] DOB: 05-22-1976 PCP: Darcus Austin, MD (Inactive)     Subjective:  Chief Complaint  Patient presents with  . Establish Care    HPI: The patient is a 43 y.o. female who presents today for establishing care. She denies any changes to past medical history. There have been no recent hospitalizations. They are not following a well balanced diet and exercise plan. Weight has been stable. No complaints today. She is followed by psychiatry for all of her medication except her oxybutynin and is requesting a refill of this. Due for annual and labs in June.   Family history of copd/diabetes/htn in her mother. She died in Mar 06, 2013 from copd/ischemic bowel. Father had bi-polar, IBS and also passed away in 2013-03-06. 1 brother who is not healthy. Doesn't know much about him.   Immunization History  Administered Date(s) Administered  . Influenza Split 08/13/2014  . Tdap 12/31/2018   Colonoscopy: routine screening at 48 years.  Mammogram:  10/01/2018 Pap smear: no longer needs paps. S/p hysterectomy for bleeding  Tdap: unsure Hiv: will do at annual labs.    Review of Systems  Constitutional: Negative for chills, fatigue and fever.  HENT: Negative for dental problem, ear pain, hearing loss and trouble swallowing.   Eyes: Negative for visual disturbance.  Respiratory: Negative for cough, chest tightness and shortness of breath.   Cardiovascular: Negative for chest pain, palpitations and leg swelling.  Gastrointestinal: Positive for constipation. Negative for abdominal pain, blood in stool, diarrhea and nausea.  Endocrine: Negative for cold intolerance, polydipsia, polyphagia and polyuria.  Genitourinary: Negative for dysuria and hematuria.  Musculoskeletal: Negative for arthralgias and back pain.  Skin: Negative.  Negative for rash.  Neurological: Positive for headaches. Negative for dizziness.  Psychiatric/Behavioral: Positive for sleep disturbance. Negative for  dysphoric mood. The patient is not nervous/anxious.     Allergies Patient is allergic to dilaudid [hydromorphone]; amoxicillin; ancef [cefazolin]; penicillins; adhesive [tape]; and keflex [cephalexin].  Past Medical History Patient  has a past medical history of Abrasion of right thumb (04/19/2015), Anxiety, Carpal tunnel syndrome on both sides (12/1306), Complication of anesthesia, Dental crown present, Diabetes mellitus without complication ([place]), Exercise-induced asthma, Headache associated with hormonal factors, Heart murmur, History of endometrial ablation, Hyperlipidemia, Low serum iron, Obesity, PONV (postoperative nausea and vomiting), Pre-diabetes, PVCs (premature ventricular contractions), Seasonal allergies, and Trigger finger.  Surgical History Patient  has a past surgical history that includes Cesarean section (04/23/2002); Dilation and curettage of uterus; Cesarean section (03/28/2005); LASIK; Breast reduction surgery (Bilateral, 10/27/2013); Carpal tunnel release (Bilateral, 04/22/2015); Trigger finger release; uterine ablation; Abdominal hysterectomy; Laparoscopic vaginal hysterectomy with salpingectomy (Bilateral, 09/22/2018); and Reduction mammaplasty (Bilateral, 03/06/13).  Family History Pateint's family history includes Alcohol abuse in her brother, father, maternal grandfather, maternal grandmother, and paternal grandfather; COPD in her maternal grandfather, maternal grandmother, mother, and another family member; Cancer in her maternal grandfather and another family member; Depression in her mother; Diabetes in her mother, paternal grandmother, and another family member; Early death in her father and mother; Heart attack in her father; Hyperlipidemia in her mother and another family member; Hypertension in her mother and another family member; Mental illness in her brother and father.  Social History Patient  reports that she has never smoked. She has never used smokeless tobacco. She  reports current alcohol use. She reports that she does not use drugs.    Objective: Vitals:   12/31/18 0925  BP: 104/78  Pulse: 74  Temp: 98.5 F (36.9  C)  TempSrc: Oral  SpO2: 98%  Weight: 200 lb 6.4 oz (90.9 kg)  Height: 5\' 3"  (1.6 m)    Body mass index is 35.5 kg/m.  Physical Exam Vitals signs reviewed.  Constitutional:      Appearance: She is well-developed.  HENT:     Right Ear: External ear normal.     Left Ear: External ear normal.  Eyes:     Conjunctiva/sclera: Conjunctivae normal.     Pupils: Pupils are equal, round, and reactive to light.  Neck:     Musculoskeletal: Normal range of motion and neck supple.     Thyroid: No thyromegaly.  Cardiovascular:     Rate and Rhythm: Normal rate and regular rhythm.     Heart sounds: Normal heart sounds. No murmur.  Pulmonary:     Effort: Pulmonary effort is normal.     Breath sounds: Normal breath sounds.  Abdominal:     General: Bowel sounds are normal. There is no distension.     Palpations: Abdomen is soft.     Tenderness: There is no abdominal tenderness.  Lymphadenopathy:     Cervical: No cervical adenopathy.  Skin:    General: Skin is warm and dry.     Findings: No rash.  Neurological:     Mental Status: She is alert and oriented to person, place, and time.     Cranial Nerves: No cranial nerve deficit.     Coordination: Coordination normal.     Deep Tendon Reflexes: Reflexes normal.  Psychiatric:        Behavior: Behavior normal.     Depression screen [facility] 2/9 12/31/2018 10/09/2016 08/06/2016 06/05/2016  Decreased Interest 0 0 0 0  Down, Depressed, Hopeless 0 0 0 0  PHQ - 2 Score 0 0 0 0  Altered sleeping 0 - - -  Tired, decreased energy 1 - - -  Change in appetite 0 - - -  Feeling bad or failure about yourself  0 - - -  Trouble concentrating 0 - - -  Moving slowly or fidgety/restless 0 - - -  Suicidal thoughts 0 - - -  PHQ-9 Score 1 - - -  Difficult doing work/chores Not difficult at all - - -        Assessment/plan: 1. Stress incontinence of urine Refills given. Very well controlled. Will return for annual and labs in June. Continue with psychiatry care and refills.   2. Need for Tdap vaccination - Tdap vaccine greater than or equal to 7yo IM    Return if symptoms worsen or fail to improve, for annual/labs in june .     Orma Flaming, MD [place]  01/01/2019

## 2019-02-03 ENCOUNTER — Encounter: Payer: Self-pay | Admitting: Physician Assistant

## 2019-02-03 DIAGNOSIS — E7800 Pure hypercholesterolemia, unspecified: Secondary | ICD-10-CM | POA: Insufficient documentation

## 2019-02-03 DIAGNOSIS — R7303 Prediabetes: Secondary | ICD-10-CM | POA: Insufficient documentation

## 2019-02-09 MED FILL — OXYBUTYNIN CL ER 10 MG TAB: 10 | 90 days supply | Qty: 90 | Fill #0

## 2019-02-10 MED FILL — SUBVENITE 100 MG TABS: 100 | 90 days supply | Qty: 180 | Fill #0

## 2019-02-11 ENCOUNTER — Encounter: Payer: Self-pay | Admitting: Family Medicine

## 2019-02-11 DIAGNOSIS — M545 Low back pain, unspecified: Secondary | ICD-10-CM | POA: Insufficient documentation

## 2019-02-11 DIAGNOSIS — G8929 Other chronic pain: Secondary | ICD-10-CM | POA: Insufficient documentation

## 2019-03-08 MED FILL — buPROPion HCL ER (XL) 300 M: 300 | 90 days supply | Qty: 90 | Fill #1

## 2019-03-08 MED FILL — OXcarbazepine 300 MG TABS: 300 | 90 days supply | Qty: 90 | Fill #1

## 2019-04-10 ENCOUNTER — Ambulatory Visit: Payer: No Typology Code available for payment source | Admitting: Family Medicine

## 2019-04-13 ENCOUNTER — Ambulatory Visit: Payer: No Typology Code available for payment source | Admitting: Physician Assistant

## 2019-04-24 ENCOUNTER — Ambulatory Visit: Payer: No Typology Code available for payment source | Admitting: Physician Assistant

## 2019-04-24 ENCOUNTER — Other Ambulatory Visit: Payer: Self-pay

## 2019-04-24 ENCOUNTER — Encounter: Payer: Self-pay | Admitting: Family Medicine

## 2019-04-24 ENCOUNTER — Ambulatory Visit (INDEPENDENT_AMBULATORY_CARE_PROVIDER_SITE_OTHER): Payer: No Typology Code available for payment source | Admitting: Family Medicine

## 2019-04-24 VITALS — BP 116/84 | HR 82 | Temp 98.2°F | Ht 63.0 in | Wt 207.0 lb

## 2019-04-24 DIAGNOSIS — Z114 Encounter for screening for human immunodeficiency virus [HIV]: Secondary | ICD-10-CM | POA: Diagnosis not present

## 2019-04-24 DIAGNOSIS — Z0000 Encounter for general adult medical examination without abnormal findings: Secondary | ICD-10-CM | POA: Diagnosis not present

## 2019-04-24 DIAGNOSIS — R7303 Prediabetes: Secondary | ICD-10-CM | POA: Diagnosis not present

## 2019-04-24 NOTE — Addendum Note (Signed)
Addended by: Tomi Likens on: 04/24/2019 03:19 PM   Modules accepted: Orders

## 2019-04-24 NOTE — Progress Notes (Signed)
Patient: Margaret [name] MRN: [id number] DOB: 1976/07/12 PCP: Orma Flaming, MD     Subjective:  Chief Complaint  Patient presents with  . Annual Exam    pt is fasting    HPI: The patient is a 43 y.o. female who presents today for annual exam. She denies any changes to past medical history. There have been no recent hospitalizations. They are not following a well balanced diet and exercise plan. Weight has been increasing. She has gained 7 pounds since February. No complaints today.   No FH of colon cancer or breast cancer.   Pre-diabetic. Due for a1c today. Last a1c was 5.8.  Hyperlipidemia: fasting for labs today.   Immunization History  Administered Date(s) Administered  . Influenza Split 08/13/2014  . Influenza,inj,Quad PF,6+ Mos 07/24/2018  . Tdap 12/31/2018   Colonoscopy: routine screening.  Mammogram: 10/01/2018 Pap smear: hysterectomy in October 2019. No longer needs.  Tdap: utd Hiv; today    Review of Systems  Constitutional: Negative for chills, fatigue, fever and unexpected weight change.  HENT: Negative.  Negative for dental problem, ear pain, hearing loss and trouble swallowing.   Eyes: Negative for visual disturbance.  Respiratory: Negative for cough, chest tightness and shortness of breath.   Cardiovascular: Negative for chest pain, palpitations and leg swelling.  Gastrointestinal: Positive for constipation. Negative for abdominal pain, blood in stool, diarrhea and nausea.  Endocrine: Negative for cold intolerance, polydipsia, polyphagia and polyuria.  Genitourinary: Negative.  Negative for dysuria and hematuria.  Musculoskeletal: Negative for arthralgias, back pain and neck pain.  Skin: Negative.  Negative for rash.  Neurological: Positive for headaches. Negative for dizziness.  Psychiatric/Behavioral: Negative for dysphoric mood and sleep disturbance. The patient is not nervous/anxious.     Allergies Patient is allergic to dilaudid [hydromorphone];  amoxicillin; ancef [cefazolin]; penicillins; adhesive [tape]; and keflex [cephalexin].  Past Medical History Patient  has a past medical history of Abrasion of right thumb (04/19/2015), Anxiety, Carpal tunnel syndrome on both sides (06/4131), Complication of anesthesia, Dental crown present, Diabetes mellitus without complication ([place]), Exercise-induced asthma, Headache associated with hormonal factors, Heart murmur, History of endometrial ablation, History of partial hysterectomy (2019), Hyperlipidemia, Low serum iron, Obesity, PONV (postoperative nausea and vomiting), Pre-diabetes, PVCs (premature ventricular contractions), Seasonal allergies, and Trigger finger.  Surgical History Patient  has a past surgical history that includes Cesarean section (04/23/2002); Dilation and curettage of uterus; Cesarean section (03/28/2005); LASIK; Breast reduction surgery (Bilateral, 10/27/2013); Carpal tunnel release (Bilateral, 04/22/2015); Trigger finger release; uterine ablation; Abdominal hysterectomy; Laparoscopic vaginal hysterectomy with salpingectomy (Bilateral, 09/22/2018); and Reduction mammaplasty (Bilateral, 2014).  Family History Pateint's family history includes Alcohol abuse in her brother, father, maternal grandfather, maternal grandmother, and paternal grandfather; COPD in her maternal grandfather, maternal grandmother, mother, and another family member; Cancer in her maternal grandfather and another family member; Depression in her mother; Diabetes in her mother, paternal grandmother, and another family member; Early death in her father and mother; Heart attack in her father; Hyperlipidemia in her mother and another family member; Hypertension in her mother and another family member; Mental illness in her brother and father.  Social History Patient  reports that she has never smoked. She has never used smokeless tobacco. She reports current alcohol use. She reports that she does not use drugs.     Objective: Vitals:   04/24/19 1425  BP: 116/84  Pulse: 82  Temp: 98.2 F (36.8 C)  TempSrc: Oral  SpO2: 98%  Weight: 207 lb (93.9 kg)  Height:  5\' 3"  (1.6 m)    Body mass index is 36.67 kg/m.  Physical Exam Vitals signs reviewed.  Constitutional:      Appearance: She is well-developed. She is obese.  HENT:     Head: Normocephalic and atraumatic.     Right Ear: Tympanic membrane, ear canal and external ear normal.     Left Ear: Tympanic membrane, ear canal and external ear normal.     Nose: Nose normal.     Mouth/Throat:     Mouth: Mucous membranes are moist.  Eyes:     Extraocular Movements: Extraocular movements intact.     Conjunctiva/sclera: Conjunctivae normal.     Pupils: Pupils are equal, round, and reactive to light.  Neck:     Musculoskeletal: Normal range of motion and neck supple.     Thyroid: No thyromegaly.  Cardiovascular:     Rate and Rhythm: Normal rate and regular rhythm.     Heart sounds: Normal heart sounds. No murmur.  Pulmonary:     Effort: Pulmonary effort is normal.     Breath sounds: Normal breath sounds.  Abdominal:     General: Bowel sounds are normal. There is no distension.     Palpations: Abdomen is soft.     Tenderness: There is no abdominal tenderness.  Lymphadenopathy:     Cervical: No cervical adenopathy.  Skin:    General: Skin is warm and dry.     Capillary Refill: Capillary refill takes less than 2 seconds.     Findings: No rash.  Neurological:     General: No focal deficit present.     Mental Status: She is alert and oriented to person, place, and time.     Cranial Nerves: No cranial nerve deficit.     Coordination: Coordination normal.     Deep Tendon Reflexes: Reflexes normal.  Psychiatric:        Mood and Affect: Mood normal.        Behavior: Behavior normal.        Depression screen [facility] 2/9 04/24/2019 12/31/2018 10/09/2016 08/06/2016 06/05/2016  Decreased Interest 0 0 0 0 0  Down, Depressed, Hopeless 0 0 0 0 0  PHQ  - 2 Score 0 0 0 0 0  Altered sleeping 0 0 - - -  Tired, decreased energy 0 1 - - -  Change in appetite 0 0 - - -  Feeling bad or failure about yourself  0 0 - - -  Trouble concentrating 0 0 - - -  Moving slowly or fidgety/restless 0 0 - - -  Suicidal thoughts 0 0 - - -  PHQ-9 Score 0 1 - - -  Difficult doing work/chores Somewhat difficult Not difficult at all - - -    Assessment/plan: 1. Annual physical exam Routine lab work today. Doing overall very well. Knows she needs to exercise. utd on health maintenance. Healthy eating, exercise encouraged. F/u in one year.  - TSH - Lipid panel - Comprehensive metabolic panel - CBC with Differential/Platelet  2. Encounter for screening for HIV - HIV Antibody (routine testing w rflx)  3. Pre diabetes -a1c, start exercising and working on diet. F/u in 6 months.   Return in about 6 months (around 10/25/2019) for a1c/pre diabetes .   Orma Flaming, MD [place]  04/24/2019

## 2019-04-24 NOTE — Patient Instructions (Signed)
WATER, MOVE and could try miralax like 3-4 days a week to see if it helps with constipation.

## 2019-04-24 NOTE — Addendum Note (Signed)
Addended by: Tomi Likens on: 04/24/2019 03:18 PM   Modules accepted: Orders

## 2019-04-26 LAB — COMPREHENSIVE METABOLIC PANEL
AG Ratio: 1.9 (calc) (ref 1.0–2.5)
ALT: 13 U/L (ref 6–29)
AST: 12 U/L (ref 10–35)
Albumin: 4.5 g/dL (ref 3.6–5.1)
Alkaline phosphatase (APISO): 46 U/L (ref 31–125)
BUN: 9 mg/dL (ref 7–25)
CO2: 24 mmol/L (ref 20–32)
Calcium: 9.3 mg/dL (ref 8.6–10.2)
Chloride: 105 mmol/L (ref 98–110)
Creat: 0.67 mg/dL (ref 0.50–1.10)
Globulin: 2.4 g/dL (calc) (ref 1.9–3.7)
Glucose, Bld: 88 mg/dL (ref 65–99)
Potassium: 4 mmol/L (ref 3.5–5.3)
Sodium: 139 mmol/L (ref 135–146)
Total Bilirubin: 0.4 mg/dL (ref 0.2–1.2)
Total Protein: 6.9 g/dL (ref 6.1–8.1)

## 2019-04-26 LAB — CBC WITH DIFFERENTIAL/PLATELET
Absolute Monocytes: 444 cells/uL (ref 200–950)
Basophils Absolute: 52 cells/uL (ref 0–200)
Basophils Relative: 0.6 %
Eosinophils Absolute: 52 cells/uL (ref 15–500)
Eosinophils Relative: 0.6 %
HCT: 36.3 % (ref 35.0–45.0)
Hemoglobin: 12.2 g/dL (ref 11.7–15.5)
Lymphs Abs: 3471 cells/uL (ref 850–3900)
MCH: 27.2 pg (ref 27.0–33.0)
MCHC: 33.6 g/dL (ref 32.0–36.0)
MCV: 80.8 fL (ref 80.0–100.0)
MPV: 9 fL (ref 7.5–12.5)
Monocytes Relative: 5.1 %
Neutro Abs: 4681 cells/uL (ref 1500–7800)
Neutrophils Relative %: 53.8 %
Platelets: 309 10*3/uL (ref 140–400)
RBC: 4.49 10*6/uL (ref 3.80–5.10)
RDW: 14 % (ref 11.0–15.0)
Total Lymphocyte: 39.9 %
WBC: 8.7 10*3/uL (ref 3.8–10.8)

## 2019-04-26 LAB — LIPID PANEL
Cholesterol: 210 mg/dL — ABNORMAL HIGH (ref <200)
HDL: 63 mg/dL (ref >=50)
LDL Cholesterol (Calc): 127 mg/dL (calc) — ABNORMAL HIGH
Non-HDL Cholesterol (Calc): 147 mg/dL (calc) — ABNORMAL HIGH (ref <130)
Total CHOL/HDL Ratio: 3.3 (calc) (ref <5.0)
Triglycerides: 98 mg/dL (ref <150)

## 2019-04-26 LAB — HEMOGLOBIN A1C
Hgb A1c MFr Bld: 5.8 % of total Hgb — ABNORMAL HIGH (ref <5.7)
Mean Plasma Glucose: 120 (calc)
eAG (mmol/L): 6.6 (calc)

## 2019-04-26 LAB — HIV ANTIBODY (ROUTINE TESTING W REFLEX): HIV 1&2 Ab, 4th Generation: NONREACTIVE

## 2019-04-26 LAB — TSH: TSH: 1.12 mIU/L

## 2019-04-29 DIAGNOSIS — M79644 Pain in right finger(s): Secondary | ICD-10-CM | POA: Insufficient documentation

## 2019-05-01 ENCOUNTER — Ambulatory Visit: Payer: No Typology Code available for payment source | Admitting: Physician Assistant

## 2019-05-01 ENCOUNTER — Other Ambulatory Visit: Payer: Self-pay

## 2019-05-01 ENCOUNTER — Encounter: Payer: Self-pay | Admitting: Physician Assistant

## 2019-05-01 DIAGNOSIS — F319 Bipolar disorder, unspecified: Secondary | ICD-10-CM | POA: Diagnosis not present

## 2019-05-01 MED ORDER — OXCARBAZEPINE 300 MG PO TABS: 300.0000 mg | ORAL_TABLET | Freq: Every day | ORAL | 1 refills | Status: DC

## 2019-05-01 MED ORDER — LAMOTRIGINE 100 MG PO TABS: 200.0000 mg | ORAL_TABLET | Freq: Every day | ORAL | 1 refills | Status: DC

## 2019-05-01 MED ORDER — BUPROPION HCL ER (XL) 300 MG PO TB24: 300.0000 mg | ORAL_TABLET | Freq: Every day | ORAL | 1 refills | Status: DC

## 2019-05-01 MED FILL — SUBVENITE 100 MG TABS: 100 | 90 days supply | Qty: 180 | Fill #0

## 2019-05-01 NOTE — Progress Notes (Signed)
[facility]  Patient ID: Margaret [name],  MRN: [id number]  PCP: Orma Flaming, MD  Date of Evaluation: 05/01/2019 Time spent:15 minutes  Chief Complaint:  Chief Complaint    Follow-up      HISTORY/CURRENT STATUS: HPI For routine med check.   Doing well. Meds are working well.  Happy that she's working in a new position.  Has several trips planned for the summer and is looking forward to that.    Patient denies loss of interest in usual activities and is able to enjoy things.  Denies decreased energy or motivation.  Appetite has not changed.  No extreme sadness, tearfulness, or feelings of hopelessness.  Denies any changes in concentration, making decisions or remembering things.  Denies suicidal or homicidal thoughts.  Patient denies increased energy with decreased need for sleep, no increased talkativeness, no racing thoughts, no impulsivity or risky behaviors, no increased spending, no increased libido, no grandiosity. No increased irritability.   Denies dizziness, syncope, seizures, numbness, tingling, tremor, tics, unsteady gait, slurred speech, confusion. Denies muscle or joint pain, stiffness, or dystonia.  Individual Medical History/ Review of Systems: Changes? :No    Past medications for mental health diagnoses include: Lexapro caused sexual side effects, Depakote, Effexor, Zoloft, Prozac, Trileptal, Lamictal, Wellbutrin  Allergies: Dilaudid [hydromorphone]; Amoxicillin; Ancef [cefazolin]; Penicillins; Adhesive [tape]; and Keflex [cephalexin]  Current Medications:  Current Outpatient Medications:  .  buPROPion (WELLBUTRIN XL) 300 MG 24 hr tablet, Take 1 tablet (300 mg total) by mouth daily., Disp: 90 tablet, Rfl: 1 .  docusate sodium (COLACE) 100 MG capsule, Take 100 mg by mouth 2 (two) times daily., Disp: , Rfl:  .  ibuprofen (ADVIL,MOTRIN) 200 MG tablet, Take 600 mg by mouth every 6 (six) hours as needed for headache or mild pain., Disp: , Rfl:  .   lamoTRIgine (LAMICTAL) 100 MG tablet, Take 2 tablets (200 mg total) by mouth daily., Disp: 180 tablet, Rfl: 1 .  naproxen sodium (ANAPROX) 550 MG tablet, Take 550 mg by mouth every 8 (eight) hours as needed for mild pain., Disp: , Rfl:  .  Oxcarbazepine (TRILEPTAL) 300 MG tablet, Take 1 tablet (300 mg total) by mouth at bedtime., Disp: 90 tablet, Rfl: 1 .  oxybutynin (DITROPAN-XL) 10 MG 24 hr tablet, Take 1 tablet (10 mg total) by mouth at bedtime., Disp: 90 tablet, Rfl: 3 .  sennosides-docusate sodium (SENOKOT-S) 8.6-50 MG tablet, Take 1 tablet by mouth 2 (two) times daily., Disp: , Rfl:  .  albuterol (PROVENTIL HFA;VENTOLIN HFA) 108 (90 BASE) MCG/ACT inhaler, Inhale 2 puffs into the lungs every 6 (six) hours as needed for wheezing or shortness of breath., Disp: , Rfl:  .  Biotin 10 MG CAPS, Take by mouth., Disp: , Rfl:    Medication Side Effects: none  Family Medical/ Social History: Changes? Yes changed jobs.  Is now working at [employer] in [place].   MENTAL HEALTH EXAM:  Last menstrual period 11/04/2018.There is no height or weight on file to calculate BMI.  General Appearance: Casual  Eye Contact:  Good  Speech:  Clear and Coherent  Volume:  Normal  Mood:  Euthymic  Affect:  Appropriate  Thought Process:  Goal Directed  Orientation:  Full (Time, Place, and Person)  Thought Content: Logical   Suicidal Thoughts:  No  Homicidal Thoughts:  No  Memory:  WNL  Judgement:  Good  Insight:  Good  Psychomotor Activity:  Normal  Concentration:  Concentration: Good  Recall:  Good  Fund of Knowledge: Good  Language: Good  Assets:  Desire for Improvement  ADL's:  Intact  Cognition: WNL  Prognosis:  Good    DIAGNOSES:    ICD-10-CM   1. Bipolar I disorder ([place]) F31.9     Receiving Psychotherapy: No    RECOMMENDATIONS:  Continue Lamictal 100 mg, 2 every morning. Continue Trileptal 300 mg 1 nightly. Wellbutrin XL 300 mg daily. Return in 6 months.  Donnal Moat,  PA-C   This record has been created using [employer].  Chart creation errors have been sought, but may not always have been located and corrected. Such creation errors do not reflect on the standard of medical care.

## 2019-05-12 MED FILL — OXYBUTYNIN CL ER 10 MG TAB: 10 | 90 days supply | Qty: 90 | Fill #1

## 2019-06-02 MED FILL — buPROPion HCL ER (XL) 300 M: 300 | 90 days supply | Qty: 90 | Fill #0

## 2019-06-02 MED FILL — SUBVENITE 100 MG TABS: 100 | 90 days supply | Qty: 180 | Fill #1

## 2019-07-07 MED FILL — OXcarbazepine 300 MG TABS: 300 | 90 days supply | Qty: 90 | Fill #0

## 2019-08-31 MED FILL — LAMOTRIGINE 100 MG TABS: 100 | 90 days supply | Qty: 180 | Fill #0

## 2019-09-01 ENCOUNTER — Other Ambulatory Visit: Payer: Self-pay | Admitting: Obstetrics and Gynecology

## 2019-09-01 DIAGNOSIS — Z1231 Encounter for screening mammogram for malignant neoplasm of breast: Secondary | ICD-10-CM

## 2019-09-13 MED FILL — buPROPion HCL ER (XL) 300 M: 300 | 90 days supply | Qty: 90 | Fill #1

## 2019-09-14 MED FILL — OXYBUTYNIN CL ER 10 MG TAB: 10 | 90 days supply | Qty: 90 | Fill #2

## 2019-09-29 MED FILL — DOXYCYCLINE HYC 100 MG CAPS: 100 | 9 days supply | Qty: 18 | Fill #0

## 2019-09-30 DIAGNOSIS — Z4789 Encounter for other orthopedic aftercare: Secondary | ICD-10-CM | POA: Insufficient documentation

## 2019-09-30 HISTORY — DX: Encounter for other orthopedic aftercare: Z47.89

## 2019-10-14 ENCOUNTER — Ambulatory Visit
Admission: RE | Admit: 2019-10-14 | Discharge: 2019-10-14 | Disposition: A | Discharge status: Home / Self Care | Payer: No Typology Code available for payment source | Source: Ambulatory Visit | Attending: Obstetrics and Gynecology | Admitting: Obstetrics and Gynecology

## 2019-10-14 ENCOUNTER — Other Ambulatory Visit: Payer: Self-pay

## 2019-10-14 DIAGNOSIS — Z1231 Encounter for screening mammogram for malignant neoplasm of breast: Secondary | ICD-10-CM

## 2019-10-19 MED FILL — OXcarbazepine 300 MG TABS: 300 | 90 days supply | Qty: 90 | Fill #1

## 2019-10-28 ENCOUNTER — Ambulatory Visit (INDEPENDENT_AMBULATORY_CARE_PROVIDER_SITE_OTHER): Payer: No Typology Code available for payment source | Admitting: Family Medicine

## 2019-10-28 ENCOUNTER — Encounter: Payer: Self-pay | Admitting: Family Medicine

## 2019-10-28 VITALS — BP 106/70 | HR 73 | Temp 97.8°F | Ht 63.0 in | Wt 197.0 lb

## 2019-10-28 DIAGNOSIS — R7303 Prediabetes: Secondary | ICD-10-CM

## 2019-10-28 NOTE — Progress Notes (Signed)
Patient: Margaret [name] MRN: [id number] DOB: [birth year] PCP: Orma Flaming, MD     Subjective:  Chief Complaint  Patient presents with  . 6 month follow up for a1c    HPI: The patient is a 42 y.o. female who presents today for prediabetes. Last a1c  Months ago was 5.8. she has also lost 16 pounds, but gained 6 pounds back recently with the holiday. She is doing well overall.   Review of Systems  Constitutional: Negative for fatigue.  Eyes: Negative for visual disturbance.  Respiratory: Negative for shortness of breath.   Cardiovascular: Negative for chest pain, palpitations and leg swelling.  Gastrointestinal: Positive for constipation. Negative for abdominal pain, nausea and vomiting.  Neurological: Positive for headaches. Negative for dizziness.  Psychiatric/Behavioral: Negative for sleep disturbance.    Allergies Patient is allergic to dilaudid [hydromorphone]; amoxicillin; ancef [cefazolin]; penicillins; adhesive [tape]; and keflex [cephalexin].  Past Medical History Patient  has a past medical history of Abrasion of right thumb (04/19/2015), Anxiety, Carpal tunnel syndrome on both sides ([place]), Complication of anesthesia, Dental crown present, Diabetes mellitus without complication ([place]), Exercise-induced asthma, Headache associated with hormonal factors, Heart murmur, History of endometrial ablation, History of partial hysterectomy (2019), Hyperlipidemia, Low serum iron, Obesity, PONV (postoperative nausea and vomiting), Pre-diabetes, PVCs (premature ventricular contractions), Seasonal allergies, and Trigger finger.  Surgical History Patient  has a past surgical history that includes Cesarean section (04/23/2002); Dilation and curettage of uterus; Cesarean section (03/28/2005); LASIK; Breast reduction surgery (Bilateral, 10/27/2013); Carpal tunnel release (Bilateral, 04/22/2015); Trigger finger release; uterine ablation; Abdominal hysterectomy; Laparoscopic vaginal hysterectomy  with salpingectomy (Bilateral, 09/22/2018); Reduction mammaplasty (Bilateral, 2014); and Trigger finger release (Right, 2020).  Family History Pateint's family history includes Alcohol abuse in her brother, father, maternal grandfather, maternal grandmother, and paternal grandfather; COPD in her maternal grandfather, maternal grandmother, mother, and another family member; Cancer in her maternal grandfather and another family member; Depression in her mother; Diabetes in her mother, paternal grandmother, and another family member; Early death in her father and mother; Heart attack in her father; Hyperlipidemia in her mother and another family member; Hypertension in her mother and another family member; Mental illness in her brother and father.  Social History Patient  reports that she has never smoked. She has never used smokeless tobacco. She reports current alcohol use. She reports that she does not use drugs.    Objective: Vitals:   10/28/19 1430  BP: 106/70  Pulse: 73  Temp: 97.8 F (36.6 C)  TempSrc: Skin  SpO2: 98%  Weight: 197 lb (89.4 kg)  Height: 5\' 3"  (1.6 m)    Body mass index is 34.9 kg/m.  Physical Exam Vitals signs reviewed.  Constitutional:      Appearance: Normal appearance. She is obese.  HENT:     Head: Normocephalic and atraumatic.     Nose: Nose normal.  Eyes:     Pupils: Pupils are equal, round, and reactive to light.  Neck:     Musculoskeletal: Normal range of motion and neck supple.  Cardiovascular:     Rate and Rhythm: Normal rate and regular rhythm.     Heart sounds: Normal heart sounds.  Pulmonary:     Effort: Pulmonary effort is normal.     Breath sounds: Normal breath sounds.  Abdominal:     General: Abdomen is flat. Bowel sounds are normal.     Palpations: Abdomen is soft.  Neurological:     General: No focal deficit present.  Mental Status: She is alert and oriented to person, place, and time.  Psychiatric:        Behavior: Behavior  normal.        Assessment/plan: 1. Pre-diabetes Doing well and has lost weight. Routine labs today and f/u in 6 months for annual with fasting labs.  - Hemoglobin A1c  -see if insurance covers myrbetriq. May have less side effects than her ditropan.   This visit occurred during the SARS-CoV-2 public health emergency.  Safety protocols were in place, including screening questions prior to the visit, additional usage of staff PPE, and extensive cleaning of exam room while observing appropriate contact time as indicated for disinfecting solutions.    Return in about 6 months (around 04/27/2020) for annual/labs .  Orma Flaming, MD [place]   10/28/2019

## 2019-10-29 LAB — HEMOGLOBIN A1C: Hgb A1c MFr Bld: 5.9 % (ref 4.6–6.5)

## 2019-11-04 ENCOUNTER — Encounter: Payer: Self-pay | Admitting: Physician Assistant

## 2019-11-04 ENCOUNTER — Other Ambulatory Visit: Payer: Self-pay

## 2019-11-04 ENCOUNTER — Ambulatory Visit (INDEPENDENT_AMBULATORY_CARE_PROVIDER_SITE_OTHER): Payer: No Typology Code available for payment source | Admitting: Physician Assistant

## 2019-11-04 ENCOUNTER — Ambulatory Visit: Payer: No Typology Code available for payment source | Admitting: Physician Assistant

## 2019-11-04 DIAGNOSIS — F319 Bipolar disorder, unspecified: Secondary | ICD-10-CM

## 2019-11-04 MED ORDER — LAMOTRIGINE 100 MG PO TABS: 200.0000 mg | ORAL_TABLET | Freq: Every day | ORAL | 1 refills | Status: DC

## 2019-11-04 MED ORDER — BUPROPION HCL ER (XL) 300 MG PO TB24: 300.0000 mg | ORAL_TABLET | Freq: Every day | ORAL | 1 refills | Status: DC

## 2019-11-04 MED ORDER — OXCARBAZEPINE 300 MG PO TABS: 300.0000 mg | ORAL_TABLET | Freq: Every day | ORAL | 1 refills | Status: DC

## 2019-11-04 NOTE — Progress Notes (Signed)
[facility]  Patient ID: Margaret [name],  MRN: [id number]  PCP: Orma Flaming, MD  Date of Evaluation: 11/04/2019 Time spent:15 minutes  Chief Complaint:  Chief Complaint    Depression; Follow-up      HISTORY/CURRENT STATUS: HPI For routine med check.  Under a lot of stress, dog might have bone cancer. But not sure. She and husband get along well but she does have some sexual needs that aren't being met. "We've talked about it and are working on it." Son totalled his car. 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 Feels that meds are working.   Patient denies loss of interest in usual activities and is able to enjoy things.  Denies decreased energy or motivation.  Appetite has not changed.  No extreme sadness, tearfulness, or feelings of hopelessness.  Denies any changes in concentration, making decisions or remembering things.  Denies suicidal or homicidal thoughts.  Patient denies increased energy with decreased need for sleep, no increased talkativeness, no racing thoughts, no impulsivity or risky behaviors, no increased spending, no increased libido, no grandiosity.  Denies dizziness, syncope, seizures, numbness, tingling, tremor, tics, unsteady gait, slurred speech, confusion. Denies muscle or joint pain, stiffness, or dystonia.  Individual Medical History/ Review of Systems: Changes? :No    Past medications for mental health diagnoses include: Lexapro caused sexual side effects, Depakote, Effexor, Zoloft, Prozac, Trileptal, Lamictal, Wellbutrin  Allergies: Dilaudid [hydromorphone], Amoxicillin, Ancef [cefazolin], Penicillins, Adhesive [tape], and Keflex [cephalexin]  Current Medications:  Current Outpatient Medications:  .  albuterol (PROVENTIL HFA;VENTOLIN HFA) 108 (90 BASE) MCG/ACT inhaler, Inhale 2 puffs into the lungs every 6 (six) hours as needed for wheezing or shortness of breath., Disp: , Rfl:  .  buPROPion (WELLBUTRIN XL) 300 MG 24 hr  tablet, Take 1 tablet (300 mg total) by mouth daily., Disp: 90 tablet, Rfl: 1 .  docusate sodium (COLACE) 100 MG capsule, Take 100 mg by mouth 2 (two) times daily., Disp: , Rfl:  .  ibuprofen (ADVIL,MOTRIN) 200 MG tablet, Take 600 mg by mouth every 6 (six) hours as needed for headache or mild pain., Disp: , Rfl:  .  lamoTRIgine (LAMICTAL) 100 MG tablet, Take 2 tablets (200 mg total) by mouth daily., Disp: 180 tablet, Rfl: 1 .  Oxcarbazepine (TRILEPTAL) 300 MG tablet, Take 1 tablet (300 mg total) by mouth at bedtime., Disp: 90 tablet, Rfl: 1 .  oxybutynin (DITROPAN-XL) 10 MG 24 hr tablet, Take 1 tablet (10 mg total) by mouth at bedtime., Disp: 90 tablet, Rfl: 3 .  sennosides-docusate sodium (SENOKOT-S) 8.6-50 MG tablet, Take 1 tablet by mouth 2 (two) times daily., Disp: , Rfl:  .  Biotin 10 MG CAPS, Take by mouth., Disp: , Rfl:  Medication Side Effects: none  Family Medical/ Social History: Changes? No  MENTAL HEALTH EXAM:  Last menstrual period 11/04/2018.There is no height or weight on file to calculate BMI.  General Appearance: Casual, Neat, Well Groomed and Obese  Eye Contact:  Good  Speech:  Clear and Coherent  Volume:  Normal  Mood:  Euthymic  Affect:  Appropriate  Thought Process:  Goal Directed and Descriptions of Associations: Intact  Orientation:  Full (Time, Place, and Person)  Thought Content: Logical   Suicidal Thoughts:  No  Homicidal Thoughts:  No  Memory:  WNL  Judgement:  Good  Insight:  Good  Psychomotor Activity:  Normal  Concentration:  Concentration: Good  Recall:  Good  Fund of Knowledge: Good  Language: Good  Assets:  Desire for Improvement  ADL's:  Intact  Cognition: WNL  Prognosis:  Good    DIAGNOSES:    ICD-10-CM   1. Bipolar I disorder ([place])  F31.9     Receiving Psychotherapy: No    RECOMMENDATIONS:  Continue Lamictal 100 mg, 2 every morning. Continue Trileptal 300 mg 1 nightly. Wellbutrin XL 300 mg daily. Return in 6 months.  Donnal Moat, PA-C

## 2019-12-11 ENCOUNTER — Ambulatory Visit: Payer: No Typology Code available for payment source | Admitting: Physician Assistant

## 2019-12-17 MED FILL — BUPROPION HCL XL 300 MG TAB: 300 | 30 days supply | Qty: 30 | Fill #0

## 2019-12-17 MED FILL — OXYBUTYNIN CL ER 10 MG TAB: 10 | 90 days supply | Qty: 90 | Fill #3

## 2019-12-17 MED FILL — LAMOTRIGINE 100 MG TABS: 100 | 90 days supply | Qty: 180 | Fill #0

## 2020-01-22 ENCOUNTER — Other Ambulatory Visit: Payer: Self-pay

## 2020-01-22 MED ORDER — OXCARBAZEPINE 300 MG PO TABS: 300.0000 mg | ORAL_TABLET | Freq: Every day | ORAL | 1 refills | Status: DC

## 2020-01-22 MED FILL — OXcarbazepine 300 MG TABS: 300 | 90 days supply | Qty: 90 | Fill #0

## 2020-02-11 MED FILL — ALBUTEROL SULFATE HFA 108 (: 108 (90 BAS | 17 days supply | Qty: 18 | Fill #0

## 2020-02-12 MED FILL — buPROPion HCL ER (XL) 300 M: 300 | 30 days supply | Qty: 30 | Fill #1

## 2020-03-23 ENCOUNTER — Other Ambulatory Visit: Payer: Self-pay | Admitting: Family Medicine

## 2020-03-23 MED FILL — OXYBUTYNIN CL ER 10 MG TAB: 10 | 90 days supply | Qty: 90 | Fill #0

## 2020-03-23 MED FILL — buPROPion HCL ER (XL) 300 M: 300 | 90 days supply | Qty: 90 | Fill #2

## 2020-03-23 MED FILL — LAMOTRIGINE 100 MG TABS: 100 | 90 days supply | Qty: 180 | Fill #1

## 2020-04-27 ENCOUNTER — Ambulatory Visit: Payer: No Typology Code available for payment source | Admitting: Family Medicine

## 2020-05-06 ENCOUNTER — Ambulatory Visit: Payer: No Typology Code available for payment source | Admitting: Physician Assistant

## 2020-05-11 ENCOUNTER — Other Ambulatory Visit: Payer: Self-pay | Admitting: Physician Assistant

## 2020-05-11 ENCOUNTER — Encounter: Payer: Self-pay | Admitting: Physician Assistant

## 2020-05-11 ENCOUNTER — Ambulatory Visit (INDEPENDENT_AMBULATORY_CARE_PROVIDER_SITE_OTHER): Payer: No Typology Code available for payment source | Admitting: Physician Assistant

## 2020-05-11 ENCOUNTER — Other Ambulatory Visit: Payer: Self-pay

## 2020-05-11 DIAGNOSIS — F319 Bipolar disorder, unspecified: Secondary | ICD-10-CM | POA: Diagnosis not present

## 2020-05-11 MED ORDER — OXCARBAZEPINE 300 MG PO TABS: 300.0000 mg | ORAL_TABLET | Freq: Every day | ORAL | 1 refills | Status: DC

## 2020-05-11 MED ORDER — LAMOTRIGINE 100 MG PO TABS: 200.0000 mg | ORAL_TABLET | Freq: Every day | ORAL | 1 refills | Status: DC

## 2020-05-11 MED ORDER — BUPROPION HCL ER (XL) 300 MG PO TB24: 300.0000 mg | ORAL_TABLET | Freq: Every day | ORAL | 1 refills | Status: DC

## 2020-05-11 NOTE — Progress Notes (Signed)
[facility]  Patient ID: Margaret [name],  MRN: [id number]  PCP: Orma Flaming, MD  Date of Evaluation: 05/11/2020 Time spent:20 minutes  Chief Complaint:  Chief Complaint    Medication Refill      HISTORY/CURRENT STATUS: HPI For routine med check.  Under a lot of stress with the twins graduating, her anniversary, vacations, getting the sons off to college, etc.  Work is going well. She has been in this job for a little over a year, likes it.  Feels that the medications are still working well.  Patient denies loss of interest in usual activities and is able to enjoy things.  Denies decreased energy or motivation. No extreme sadness, tearfulness, or feelings of hopelessness.  Denies suicidal or homicidal thoughts.  Patient denies increased energy with decreased need for sleep, no increased talkativeness, no racing thoughts, no impulsivity or risky behaviors, no increased spending, no increased libido, no grandiosity.  Denies dizziness, syncope, seizures, numbness, tingling, tremor, tics, unsteady gait, slurred speech, confusion. Denies muscle or joint pain, stiffness, or dystonia.  Individual Medical History/ Review of Systems: Changes? :No    Past medications for mental health diagnoses include: Lexapro caused sexual side effects, Depakote, Effexor, Zoloft, Prozac, Trileptal, Lamictal, Wellbutrin  Allergies: Dilaudid [hydromorphone], Amoxicillin, Ancef [cefazolin], Penicillins, Adhesive [tape], and Keflex [cephalexin]  Current Medications:  Current Outpatient Medications:    albuterol (PROVENTIL HFA;VENTOLIN HFA) 108 (90 BASE) MCG/ACT inhaler, Inhale 2 puffs into the lungs every 6 (six) hours as needed for wheezing or shortness of breath., Disp: , Rfl:    buPROPion (WELLBUTRIN XL) 300 MG 24 hr tablet, Take 1 tablet (300 mg total) by mouth daily., Disp: 90 tablet, Rfl: 1   docusate sodium (COLACE) 100 MG capsule, Take 100 mg by mouth 2 (two) times daily., Disp: ,  Rfl:    ibuprofen (ADVIL,MOTRIN) 200 MG tablet, Take 600 mg by mouth every 6 (six) hours as needed for headache or mild pain., Disp: , Rfl:    lamoTRIgine (LAMICTAL) 100 MG tablet, Take 2 tablets (200 mg total) by mouth daily., Disp: 180 tablet, Rfl: 1   Oxcarbazepine (TRILEPTAL) 300 MG tablet, Take 1 tablet (300 mg total) by mouth at bedtime., Disp: 90 tablet, Rfl: 1   oxybutynin (DITROPAN-XL) 10 MG 24 hr tablet, TAKE 1 TABLET BY MOUTH AT BEDTIME, Disp: 90 tablet, Rfl: 3   sennosides-docusate sodium (SENOKOT-S) 8.6-50 MG tablet, Take 1 tablet by mouth 2 (two) times daily., Disp: , Rfl:    Biotin 10 MG CAPS, Take by mouth. (Patient not taking: Reported on 05/11/2020), Disp: , Rfl:  Medication Side Effects: none  Family Medical/ Social History: Changes? No  MENTAL HEALTH EXAM:  Last menstrual period 11/04/2018.There is no height or weight on file to calculate BMI.  General Appearance: Casual, Neat, Well Groomed and Obese  Eye Contact:  Good  Speech:  Clear and Coherent and Normal Rate  Volume:  Normal  Mood:  Euthymic  Affect:  Appropriate  Thought Process:  Goal Directed and Descriptions of Associations: Intact  Orientation:  Full (Time, Place, and Person)  Thought Content: Logical   Suicidal Thoughts:  No  Homicidal Thoughts:  No  Memory:  WNL  Judgement:  Good  Insight:  Good  Psychomotor Activity:  Normal  Concentration:  Concentration: Good  Recall:  Good  Fund of Knowledge: Good  Language: Good  Assets:  Desire for Improvement  ADL's:  Intact  Cognition: WNL  Prognosis:  Good    DIAGNOSES:  ICD-10-CM   1. Bipolar I disorder ([place])  F31.9     Receiving Psychotherapy: No    RECOMMENDATIONS:  PDMP reviewed. Continue Lamictal 100 mg, 2 every morning. Continue Trileptal 300 mg 1 nightly. Continue Wellbutrin XL 300 mg daily. Return in 6 months.  Donnal Moat, PA-C

## 2020-05-13 ENCOUNTER — Other Ambulatory Visit: Payer: Self-pay

## 2020-05-13 ENCOUNTER — Encounter: Payer: Self-pay | Admitting: Family Medicine

## 2020-05-13 ENCOUNTER — Ambulatory Visit (INDEPENDENT_AMBULATORY_CARE_PROVIDER_SITE_OTHER): Payer: No Typology Code available for payment source | Admitting: Family Medicine

## 2020-05-13 VITALS — BP 142/80 | HR 78 | Temp 98.2°F | Ht 63.0 in | Wt 206.2 lb

## 2020-05-13 DIAGNOSIS — R7303 Prediabetes: Secondary | ICD-10-CM

## 2020-05-13 DIAGNOSIS — Z1159 Encounter for screening for other viral diseases: Secondary | ICD-10-CM

## 2020-05-13 DIAGNOSIS — E7800 Pure hypercholesterolemia, unspecified: Secondary | ICD-10-CM

## 2020-05-13 DIAGNOSIS — Z0000 Encounter for general adult medical examination without abnormal findings: Secondary | ICD-10-CM | POA: Diagnosis not present

## 2020-05-13 MED ORDER — MIRABEGRON ER 25 MG PO TB24: 25.0000 mg | ORAL_TABLET | Freq: Every day | ORAL | 1 refills | Status: DC

## 2020-05-13 MED FILL — MYRBETRIQ ER 25 MG TABLET: 25 | 30 days supply | Qty: 30 | Fill #0

## 2020-05-13 NOTE — Progress Notes (Signed)
Patient: Margaret [name] MRN: [id number] DOB: [birth year] PCP: Orma Flaming, MD     Subjective:  Chief Complaint  Patient presents with  . Annual Exam  . Prediabetes  . Hyperlipidemia    HPI: The patient is a 44 y.o. female who presents today for annual exam. She denies any changes to past medical history. There have been no recent hospitalizations. They are not following a well balanced diet and exercise plan. She says that she does walk 2 miles at work, but the last month has been stressful. Weight has been stable, but she has gained about 6 pounds. She complains of being fatigued. She tried to donate blood this week and her hgb was 11.5 and they wouldn't let her donate. She denies any blood in her stool. Long hx of constipation.   Has appointment with dermatology soon.   No family history of cancer or breast cancer.   Prediabetes: due for a1c today. Last a1c was 5.9 in 10/2019.   Hyperlipidemia: fasting labs today.  The 10-year ASCVD risk score Mikey Bussing DC Jr., et al., 2013) is: 0.9%   Immunization History  Administered Date(s) Administered  . Influenza Split 08/13/2014  . Influenza,inj,Quad PF,6+ Mos 07/24/2018  . Tdap 12/31/2018   Colonoscopy: due for this with guideline changing.  Mammogram: 09/2019 Pap smear: n/a   Review of Systems  Constitutional: Positive for fatigue. Negative for chills and fever.  HENT: Negative for dental problem, ear pain, hearing loss and trouble swallowing.   Eyes: Negative for visual disturbance.  Respiratory: Negative for cough, chest tightness, shortness of breath and wheezing.   Cardiovascular: Negative for chest pain, palpitations and leg swelling.  Gastrointestinal: Negative for abdominal pain, blood in stool, diarrhea, nausea and vomiting.  Endocrine: Negative for cold intolerance, polydipsia, polyphagia and polyuria.  Genitourinary: Negative for dysuria and hematuria.  Musculoskeletal: Negative for arthralgias.  Skin: Negative for  rash.  Neurological: Negative for dizziness, light-headedness and headaches.  Psychiatric/Behavioral: Negative for dysphoric mood and sleep disturbance. The patient is not nervous/anxious.     Allergies Patient is allergic to dilaudid [hydromorphone], amoxicillin, ancef [cefazolin], penicillins, adhesive [tape], and keflex [cephalexin].  Past Medical History Patient  has a past medical history of Abrasion of right thumb (04/19/2015), Anxiety, Carpal tunnel syndrome on both sides (04/3148), Complication of anesthesia, Dental crown present, Diabetes mellitus without complication ([place]), Exercise-induced asthma, Headache associated with hormonal factors, Heart murmur, History of endometrial ablation, History of partial hysterectomy (2019), Hyperlipidemia, Low serum iron, Obesity, PONV (postoperative nausea and vomiting), Pre-diabetes, PVCs (premature ventricular contractions), Seasonal allergies, and Trigger finger.  Surgical History Patient  has a past surgical history that includes Cesarean section (04/23/2002); Dilation and curettage of uterus; Cesarean section (03/28/2005); LASIK; Breast reduction surgery (Bilateral, 10/27/2013); Carpal tunnel release (Bilateral, 04/22/2015); Trigger finger release; uterine ablation; Abdominal hysterectomy; Laparoscopic vaginal hysterectomy with salpingectomy (Bilateral, 09/22/2018); Reduction mammaplasty (Bilateral, 2014); and Trigger finger release (Right, 2020).  Family History Pateint's family history includes Alcohol abuse in her brother, father, maternal grandfather, maternal grandmother, and paternal grandfather; COPD in her maternal grandfather, maternal grandmother, mother, and another family member; Cancer in her maternal grandfather and another family member; Depression in her mother; Diabetes in her mother, paternal grandmother, and another family member; Early death in her father and mother; Heart attack in her father; Hyperlipidemia in her mother and another  family member; Hypertension in her mother and another family member; Mental illness in her brother and father.  Social History Patient  reports that she has never  smoked. She has never used smokeless tobacco. She reports current alcohol use. She reports that she does not use drugs.    Objective: Vitals:   05/13/20 1416  BP: (!) 142/80  Pulse: 78  Temp: 98.2 F (36.8 C)  TempSrc: Temporal  SpO2: 98%  Weight: 206 lb 3.2 oz (93.5 kg)  Height: 5\' 3"  (1.6 m)    Body mass index is 36.53 kg/m.  Physical Exam Vitals reviewed.  Constitutional:      Appearance: Normal appearance. She is well-developed. She is obese.  HENT:     Head: Normocephalic and atraumatic.     Right Ear: Tympanic membrane, ear canal and external ear normal.     Left Ear: Tympanic membrane, ear canal and external ear normal.     Nose: Nose normal.     Mouth/Throat:     Mouth: Mucous membranes are moist.  Eyes:     Extraocular Movements: Extraocular movements intact.     Conjunctiva/sclera: Conjunctivae normal.     Pupils: Pupils are equal, round, and reactive to light.  Neck:     Thyroid: No thyromegaly.     Vascular: No carotid bruit.  Cardiovascular:     Rate and Rhythm: Normal rate and regular rhythm.     Pulses: Normal pulses.     Heart sounds: Normal heart sounds. No murmur heard.   Pulmonary:     Effort: Pulmonary effort is normal.     Breath sounds: Normal breath sounds.  Abdominal:     General: Bowel sounds are normal. There is no distension.     Palpations: Abdomen is soft.     Tenderness: There is no abdominal tenderness.  Musculoskeletal:     Cervical back: Normal range of motion and neck supple.  Lymphadenopathy:     Cervical: No cervical adenopathy.  Skin:    General: Skin is warm and dry.     Capillary Refill: Capillary refill takes less than 2 seconds.     Findings: No rash.  Neurological:     General: No focal deficit present.     Mental Status: She is alert and oriented to  person, place, and time.     Cranial Nerves: No cranial nerve deficit.     Coordination: Coordination normal.     Deep Tendon Reflexes: Reflexes normal.  Psychiatric:        Mood and Affect: Mood normal.        Behavior: Behavior normal.      Office Visit from 05/13/2020 in [facility]  PHQ-2 Total Score 0         Assessment/plan: 1. Annual physical exam Routine fasting labs today. Hm reviewed and she is UTD. Really encouraged her to work on diet/exercise. F/u with one year.  Patient counseling [x]    Nutrition: Stressed importance of moderation in sodium/caffeine intake, saturated fat and cholesterol, caloric balance, sufficient intake of fresh fruits, vegetables, fiber, calcium, iron, and 1 mg of folate supplement per day (for females capable of pregnancy).  [x]    Stressed the importance of regular exercise.   []    Substance Abuse: Discussed cessation/primary prevention of tobacco, alcohol, or other drug use; driving or other dangerous activities under the influence; availability of treatment for abuse.   [x]    Injury prevention: Discussed safety belts, safety helmets, smoke detector, smoking near bedding or upholstery.   [x]    Sexuality: Discussed sexually transmitted diseases, partner selection, use of condoms, avoidance of unintended pregnancy  and contraceptive alternatives.  [x]   Dental health: Discussed importance of regular tooth brushing, flossing, and dental visits.  [x]    Health maintenance and immunizations reviewed. Please refer to Health maintenance section.    - CBC with Differential/Platelet - Comprehensive metabolic panel - TSH  2. Pre-diabetes  - Hemoglobin A1c  3. Hypercholesteremia  - Lipid panel  4. Encounter for hepatitis C screening test for low risk patient  - Hepatitis C antibody   This visit occurred during the SARS-CoV-2 public health emergency.  Safety protocols were in place, including screening questions prior to the  visit, additional usage of staff PPE, and extensive cleaning of exam room while observing appropriate contact time as indicated for disinfecting solutions.     Return in about 6 months (around 11/12/2020) for prediabetes. Orma Flaming, MD [place]  05/13/2020

## 2020-05-13 NOTE — Patient Instructions (Addendum)
Constipation citrucel recommended and I would do miralax.   Colonoscopy now recommended at age 89 years and if constipation worsening would recommend we do this.   Will try to see if we can get myrbetriq covered for urine issues and see if it doesn't dry you out as much. Let me know how this works, can always increase to 51m. Takes about 4 weeks to work.   Fasting labs today.    Preventive Care [age]Years Old, Female Preventive care refers to visits with your health care provider and lifestyle choices that can promote health and wellness. This includes:  A yearly physical exam. This may also be called an annual well check.  Regular dental visits and eye exams.  Immunizations.  Screening for certain conditions.  Healthy lifestyle choices, such as eating a healthy diet, getting regular exercise, not using drugs or products that contain nicotine and tobacco, and limiting alcohol use. What can I expect for my preventive care visit? Physical exam Your health care provider will check your:  Height and weight. This may be used to calculate body mass index (BMI), which tells if you are at a healthy weight.  Heart rate and blood pressure.  Skin for abnormal spots. Counseling Your health care provider may ask you questions about your:  Alcohol, tobacco, and drug use.  Emotional well-being.  Home and relationship well-being.  Sexual activity.  Eating habits.  Work and work eStatistician  Method of birth control.  Menstrual cycle.  Pregnancy history. What immunizations do I need?  Influenza (flu) vaccine  This is recommended every year. Tetanus, diphtheria, and pertussis (Tdap) vaccine  You may need a Td booster every 10 years. Varicella (chickenpox) vaccine  You may need this if you have not been vaccinated. Zoster (shingles) vaccine  You may need this after age 28865 Measles, mumps, and rubella (MMR) vaccine  You may need at least one dose of MMR if you were  born in 1957 or later. You may also need a second dose. Pneumococcal conjugate (PCV13) vaccine  You may need this if you have certain conditions and were not previously vaccinated. Pneumococcal polysaccharide (PPSV23) vaccine  You may need one or two doses if you smoke cigarettes or if you have certain conditions. Meningococcal conjugate (MenACWY) vaccine  You may need this if you have certain conditions. Hepatitis A vaccine  You may need this if you have certain conditions or if you travel or work in places where you may be exposed to hepatitis A. Hepatitis B vaccine  You may need this if you have certain conditions or if you travel or work in places where you may be exposed to hepatitis B. Haemophilus influenzae type b (Hib) vaccine  You may need this if you have certain conditions. Human papillomavirus (HPV) vaccine  If recommended by your health care provider, you may need three doses over 6 months. You may receive vaccines as individual doses or as more than one vaccine together in one shot (combination vaccines). Talk with your health care provider about the risks and benefits of combination vaccines. What tests do I need? Blood tests  Lipid and cholesterol levels. These may be checked every 5 years, or more frequently if you are over 589years old.  Hepatitis C test.  Hepatitis B test. Screening  Lung cancer screening. You may have this screening every year starting at age 288if you have a 30-pack-year history of smoking and currently smoke or have quit within the past 15 years.  Colorectal cancer screening. All adults should have this screening starting at age 71 and continuing until age 24. Your health care provider may recommend screening at age 55 if you are at increased risk. You will have tests every 1-10 years, depending on your results and the type of screening test.  Diabetes screening. This is done by checking your blood sugar (glucose) after you have not eaten  for a while (fasting). You may have this done every 1-3 years.  Mammogram. This may be done every 1-2 years. Talk with your health care provider about when you should start having regular mammograms. This may depend on whether you have a family history of breast cancer.  BRCA-related cancer screening. This may be done if you have a family history of breast, ovarian, tubal, or peritoneal cancers.  Pelvic exam and Pap test. This may be done every 3 years starting at age 2. Starting at age 20, this may be done every 5 years if you have a Pap test in combination with an HPV test. Other tests  Sexually transmitted disease (STD) testing.  Bone density scan. This is done to screen for osteoporosis. You may have this scan if you are at high risk for osteoporosis. Follow these instructions at home: Eating and drinking  Eat a diet that includes fresh fruits and vegetables, whole grains, lean protein, and low-fat dairy.  Take vitamin and mineral supplements as recommended by your health care provider.  Do not drink alcohol if: ? Your health care provider tells you not to drink. ? You are pregnant, may be pregnant, or are planning to become pregnant.  If you drink alcohol: ? Limit how much you have to 0-1 drink a day. ? Be aware of how much alcohol is in your drink. In the [place] one drink equals one 12 oz bottle of beer (355 mL), one 5 oz glass of wine (148 mL), or one 1 oz glass of hard liquor (44 mL). Lifestyle  Take daily care of your teeth and gums.  Stay active. Exercise for at least 30 minutes on 5 or more days each week.  Do not use any products that contain nicotine or tobacco, such as cigarettes, e-cigarettes, and chewing tobacco. If you need help quitting, ask your health care provider.  If you are sexually active, practice safe sex. Use a condom or other form of birth control (contraception) in order to prevent pregnancy and STIs (sexually transmitted infections).  If told by  your health care provider, take low-dose aspirin daily starting at age 70. What's next?  Visit your health care provider once a year for a well check visit.  Ask your health care provider how often you should have your eyes and teeth checked.  Stay up to date on all vaccines. This information is not intended to replace advice given to you by your health care provider. Make sure you discuss any questions you have with your health care provider. Document Revised: 07/24/2018 Document Reviewed: 07/24/2018 Elsevier Patient Education  2020 [employer].

## 2020-05-16 LAB — CBC WITH DIFFERENTIAL/PLATELET
Absolute Monocytes: 320 cells/uL (ref 200–950)
Basophils Absolute: 61 cells/uL (ref 0–200)
Basophils Relative: 0.9 %
Eosinophils Absolute: 68 cells/uL (ref 15–500)
Eosinophils Relative: 1 %
HCT: 38.1 % (ref 35.0–45.0)
Hemoglobin: 12.5 g/dL (ref 11.7–15.5)
Lymphs Abs: 3210 cells/uL (ref 850–3900)
MCH: 27.7 pg (ref 27.0–33.0)
MCHC: 32.8 g/dL (ref 32.0–36.0)
MCV: 84.3 fL (ref 80.0–100.0)
MPV: 9.1 fL (ref 7.5–12.5)
Monocytes Relative: 4.7 %
Neutro Abs: 3142 cells/uL (ref 1500–7800)
Neutrophils Relative %: 46.2 %
Platelets: 338 10*3/uL (ref 140–400)
RBC: 4.52 10*6/uL (ref 3.80–5.10)
RDW: 13.2 % (ref 11.0–15.0)
Total Lymphocyte: 47.2 %
WBC: 6.8 10*3/uL (ref 3.8–10.8)

## 2020-05-16 LAB — COMPREHENSIVE METABOLIC PANEL
AG Ratio: 1.9 (calc) (ref 1.0–2.5)
ALT: 14 U/L (ref 6–29)
AST: 13 U/L (ref 10–35)
Albumin: 4.5 g/dL (ref 3.6–5.1)
Alkaline phosphatase (APISO): 48 U/L (ref 31–125)
BUN: 9 mg/dL (ref 7–25)
CO2: 24 mmol/L (ref 20–32)
Calcium: 9.4 mg/dL (ref 8.6–10.2)
Chloride: 102 mmol/L (ref 98–110)
Creat: 0.88 mg/dL (ref 0.50–1.10)
Globulin: 2.4 g/dL (calc) (ref 1.9–3.7)
Glucose, Bld: 91 mg/dL (ref 65–99)
Potassium: 4.1 mmol/L (ref 3.5–5.3)
Sodium: 137 mmol/L (ref 135–146)
Total Bilirubin: 0.4 mg/dL (ref 0.2–1.2)
Total Protein: 6.9 g/dL (ref 6.1–8.1)

## 2020-05-16 LAB — LIPID PANEL
Cholesterol: 209 mg/dL — ABNORMAL HIGH (ref <200)
HDL: 61 mg/dL (ref >=50)
LDL Cholesterol (Calc): 127 mg/dL (calc) — ABNORMAL HIGH
Non-HDL Cholesterol (Calc): 148 mg/dL (calc) — ABNORMAL HIGH (ref <130)
Total CHOL/HDL Ratio: 3.4 (calc) (ref <5.0)
Triglycerides: 105 mg/dL (ref <150)

## 2020-05-16 LAB — HEMOGLOBIN A1C
Hgb A1c MFr Bld: 5.6 % of total Hgb (ref <5.7)
Mean Plasma Glucose: 114 (calc)
eAG (mmol/L): 6.3 (calc)

## 2020-05-16 LAB — TSH: TSH: 1.3 mIU/L

## 2020-05-16 LAB — HEPATITIS C ANTIBODY
Hepatitis C Ab: NONREACTIVE
SIGNAL TO CUT-OFF: 0 (ref <1.00)

## 2020-06-28 MED FILL — LAMOTRIGINE 100 MG TABS: 100 | 90 days supply | Qty: 180 | Fill #0

## 2020-06-28 MED FILL — buPROPion HCL ER (XL) 300 M: 300 | 30 days supply | Qty: 30 | Fill #3

## 2020-07-14 ENCOUNTER — Other Ambulatory Visit: Payer: Self-pay | Admitting: Obstetrics and Gynecology

## 2020-07-14 DIAGNOSIS — Z1231 Encounter for screening mammogram for malignant neoplasm of breast: Secondary | ICD-10-CM

## 2020-07-25 MED FILL — OXcarbazepine 300 MG TABS: 300 | 90 days supply | Qty: 90 | Fill #0

## 2020-07-25 MED FILL — buPROPion HCL ER (XL) 300 M: 300 | 90 days supply | Qty: 90 | Fill #0

## 2020-09-13 MED FILL — OXYBUTYNIN CL ER 10 MG TAB: 10 | 90 days supply | Qty: 90 | Fill #1

## 2020-10-14 ENCOUNTER — Ambulatory Visit: Payer: No Typology Code available for payment source

## 2020-10-24 MED FILL — LAMOTRIGINE 100 MG TABS: 100 | 90 days supply | Qty: 180 | Fill #1

## 2020-10-26 ENCOUNTER — Other Ambulatory Visit: Payer: Self-pay | Admitting: Family Medicine

## 2020-10-26 ENCOUNTER — Telehealth: Payer: Self-pay

## 2020-10-26 MED ORDER — VALACYCLOVIR HCL 500 MG PO TABS
500.0000 mg | ORAL_TABLET | Freq: Two times a day (BID) | ORAL | 1 refills | Status: DC
Start: 2020-10-26 — End: 2020-10-26

## 2020-10-26 MED FILL — VALACYCLOVIR HCL 500 MG TAB: 500 | 3 days supply | Qty: 6 | Fill #0

## 2020-10-26 NOTE — Telephone Encounter (Signed)
Lvm to inform patient °

## 2020-10-26 NOTE — Telephone Encounter (Signed)
Patient is requesting a RX for Valtrex for her fever blisters, I explained she might need an appt. Before hand. Please advise

## 2020-10-26 NOTE — Telephone Encounter (Signed)
Please let her know that I sent in valtrex for her.  Orma Flaming, MD [place]

## 2020-11-09 ENCOUNTER — Ambulatory Visit (INDEPENDENT_AMBULATORY_CARE_PROVIDER_SITE_OTHER): Payer: No Typology Code available for payment source | Admitting: Physician Assistant

## 2020-11-09 ENCOUNTER — Encounter: Payer: Self-pay | Admitting: Physician Assistant

## 2020-11-09 ENCOUNTER — Other Ambulatory Visit: Payer: Self-pay

## 2020-11-09 DIAGNOSIS — F319 Bipolar disorder, unspecified: Secondary | ICD-10-CM | POA: Diagnosis not present

## 2020-11-09 NOTE — Progress Notes (Signed)
[facility] Med Check  Patient ID: SHANNA UN,  MRN: [id number]  PCP: Orma Flaming, MD  Date of Evaluation: 11/09/2020 Time spent:20 minutes  Chief Complaint:  Chief Complaint    Depression      HISTORY/CURRENT STATUS: For routine med check.  Both twins got covid and were in hospital for 4 days, they're both ok. Her MIL died last week. Plus the twins are in college. So is quite stressed. But she's handling it well. Feels like the meds are working well.   Patient denies loss of interest in usual activities and is able to enjoy things.  Denies decreased energy or motivation. No extreme sadness, tearfulness, or feelings of hopelessness.  Denies suicidal or homicidal thoughts.  Anxiety is controlled for the most part. There's a lot going on with her and her family, they're planning to sell their home and move, after the first of the year.   Patient denies increased energy with decreased need for sleep, no increased talkativeness, no racing thoughts, no impulsivity or risky behaviors, no increased spending, no increased libido, no grandiosity, no paranoia, and no hallucinations.   Denies dizziness, syncope, seizures, numbness, tingling, tremor, tics, unsteady gait, slurred speech, confusion. Denies muscle or joint pain, stiffness, or dystonia.  Individual Medical History/ Review of Systems: Changes? :No    Past medications for mental health diagnoses include: Lexapro caused sexual side effects, Depakote, Effexor, Zoloft, Prozac, Trileptal, Lamictal, Wellbutrin  Allergies: Dilaudid [hydromorphone], Amoxicillin, Ancef [cefazolin], Penicillins, Adhesive [tape], and Keflex [cephalexin]  Current Medications:  Current Outpatient Medications:  .  albuterol (PROVENTIL HFA;VENTOLIN HFA) 108 (90 BASE) MCG/ACT inhaler, Inhale 2 puffs into the lungs every 6 (six) hours as needed for wheezing or shortness of breath., Disp: , Rfl:  .  buPROPion (WELLBUTRIN XL) 300 MG 24 hr tablet, Take 1  tablet (300 mg total) by mouth daily., Disp: 90 tablet, Rfl: 1 .  docusate sodium (COLACE) 100 MG capsule, Take 100 mg by mouth 2 (two) times daily., Disp: , Rfl:  .  ibuprofen (ADVIL,MOTRIN) 200 MG tablet, Take 600 mg by mouth every 6 (six) hours as needed for headache or mild pain., Disp: , Rfl:  .  lamoTRIgine (LAMICTAL) 100 MG tablet, Take 2 tablets (200 mg total) by mouth daily., Disp: 180 tablet, Rfl: 1 .  Oxcarbazepine (TRILEPTAL) 300 MG tablet, Take 1 tablet (300 mg total) by mouth at bedtime., Disp: 90 tablet, Rfl: 1 .  sennosides-docusate sodium (SENOKOT-S) 8.6-50 MG tablet, Take 1 tablet by mouth 2 (two) times daily., Disp: , Rfl:  .  valACYclovir (VALTREX) 500 MG tablet, Take 1 tablet (500 mg total) by mouth 2 (two) times daily. For 3 days, Disp: 6 tablet, Rfl: 1 .  mirabegron ER (MYRBETRIQ) 25 MG TB24 tablet, Take 1 tablet (25 mg total) by mouth daily. (Patient not taking: Reported on 11/09/2020), Disp: 30 tablet, Rfl: 1 Medication Side Effects: none  Family Medical/ Social History: Changes? See HPI  MENTAL HEALTH EXAM:  Last menstrual period 11/04/2018.There is no height or weight on file to calculate BMI.  General Appearance: Casual, Neat, Well Groomed and Obese  Eye Contact:  Good  Speech:  Clear and Coherent and Normal Rate  Volume:  Normal  Mood:  Euthymic  Affect:  Appropriate  Thought Process:  Goal Directed and Descriptions of Associations: Intact  Orientation:  Full (Time, Place, and Person)  Thought Content: Logical   Suicidal Thoughts:  No  Homicidal Thoughts:  No  Memory:  WNL  Judgement:  Good  Insight:  Good  Psychomotor Activity:  Normal  Concentration:  Concentration: Good  Recall:  Good  Fund of Knowledge: Good  Language: Good  Assets:  Desire for Improvement  ADL's:  Intact  Cognition: WNL  Prognosis:  Good    DIAGNOSES:    ICD-10-CM   1. Bipolar I disorder ([place])  F31.9     Receiving Psychotherapy: No    RECOMMENDATIONS:  PDMP  reviewed. Continue Lamictal 100 mg, 2 every morning. Continue Trileptal 300 mg 1 nightly. Continue Wellbutrin XL 300 mg daily. Return in 6 months.  Donnal Moat, PA-C

## 2020-11-10 MED FILL — buPROPion HCL ER (XL) 300 M: 300 | 90 days supply | Qty: 90 | Fill #1

## 2020-11-10 MED FILL — OXcarbazepine 300 MG TABS: 300 | 90 days supply | Qty: 90 | Fill #1

## 2020-11-16 ENCOUNTER — Ambulatory Visit: Payer: No Typology Code available for payment source | Admitting: Family Medicine

## 2020-12-01 ENCOUNTER — Encounter: Payer: Self-pay | Admitting: Family Medicine

## 2020-12-01 ENCOUNTER — Ambulatory Visit (INDEPENDENT_AMBULATORY_CARE_PROVIDER_SITE_OTHER): Payer: No Typology Code available for payment source | Admitting: Family Medicine

## 2020-12-01 ENCOUNTER — Other Ambulatory Visit: Payer: Self-pay

## 2020-12-01 VITALS — BP 132/80 | HR 88 | Temp 98.1°F | Ht 63.0 in | Wt 215.4 lb

## 2020-12-01 DIAGNOSIS — R7303 Prediabetes: Secondary | ICD-10-CM

## 2020-12-01 LAB — HEMOGLOBIN A1C: Hgb A1c MFr Bld: 6 % (ref 4.6–6.5)

## 2020-12-01 NOTE — Progress Notes (Signed)
Patient: Margaret [name] MRN: [id number] DOB: 01/09/1976 PCP: Orland Mustard, MD     Subjective:  Chief Complaint  Patient presents with  . Prediabetes    HPI: The patient is a 45 y.o. female who presents today for Prediabetes. Pt is fasting. She has been very well controlled. Likes to watch this every 6 months. She has gained 9 pounds. Poor diet and not exercising.   She didn't like the myrbetriq ER due to her headaches. She is back on ditropan XL and is doing fine with no headaches.   Review of Systems  Constitutional: Negative for chills, fatigue and fever.  HENT: Negative for dental problem, ear pain, hearing loss and trouble swallowing.   Eyes: Negative for visual disturbance.  Respiratory: Negative for cough, chest tightness and shortness of breath.   Cardiovascular: Negative for chest pain, palpitations and leg swelling.  Gastrointestinal: Negative for abdominal pain, blood in stool, diarrhea and nausea.  Endocrine: Negative for cold intolerance, polydipsia, polyphagia and polyuria.  Genitourinary: Negative for dysuria, frequency, hematuria and urgency.  Musculoskeletal: Positive for back pain. Negative for arthralgias.  Skin: Negative for rash.  Neurological: Negative for dizziness and headaches.  Psychiatric/Behavioral: Negative for dysphoric mood and sleep disturbance. The patient is not nervous/anxious.     Allergies Patient is allergic to dilaudid [hydromorphone], amoxicillin, ancef [cefazolin], penicillins, adhesive [tape], and keflex [cephalexin].  Past Medical History Patient  has a past medical history of Abrasion of right thumb (04/19/2015), Anxiety, Carpal tunnel syndrome on both sides (03/2015), Complication of anesthesia, Dental crown present, Diabetes mellitus without complication (HCC), Exercise-induced asthma, Headache associated with hormonal factors, Heart murmur, History of endometrial ablation, History of partial hysterectomy (2019), Hyperlipidemia, Low  serum iron, Obesity, PONV (postoperative nausea and vomiting), Pre-diabetes, PVCs (premature ventricular contractions), Seasonal allergies, and Trigger finger.  Surgical History Patient  has a past surgical history that includes Cesarean section (04/23/2002); Dilation and curettage of uterus; Cesarean section (03/28/2005); LASIK; Breast reduction surgery (Bilateral, 10/27/2013); Carpal tunnel release (Bilateral, 04/22/2015); Trigger finger release; uterine ablation; Abdominal hysterectomy; Laparoscopic vaginal hysterectomy with salpingectomy (Bilateral, 09/22/2018); Reduction mammaplasty (Bilateral, 2014); and Trigger finger release (Right, 2020).  Family History Pateint's family history includes Alcohol abuse in her brother, father, maternal grandfather, maternal grandmother, and paternal grandfather; COPD in her maternal grandfather, maternal grandmother, mother, and another family member; Cancer in her maternal grandfather and another family member; Depression in her mother; Diabetes in her mother, paternal grandmother, and another family member; Early death in her father and mother; Heart attack in her father; Hyperlipidemia in her mother and another family member; Hypertension in her mother and another family member; Mental illness in her brother and father.  Social History Patient  reports that she has never smoked. She has never used smokeless tobacco. She reports current alcohol use. She reports that she does not use drugs.    Objective: Vitals:   12/01/20 1028  BP: 132/80  Pulse: 88  Temp: 98.1 F (36.7 C)  TempSrc: Temporal  SpO2: 98%  Weight: 215 lb 6.4 oz (97.7 kg)  Height: 5\' 3"  (1.6 m)    Body mass index is 38.16 kg/m.  Physical Exam Vitals reviewed.  Constitutional:      Appearance: Normal appearance. She is well-developed and well-nourished. She is obese.  HENT:     Head: Normocephalic and atraumatic.     Right Ear: External ear normal.     Left Ear: External ear normal.      Mouth/Throat:     Mouth:  Oropharynx is clear and moist.  Eyes:     Extraocular Movements: EOM normal.     Conjunctiva/sclera: Conjunctivae normal.     Pupils: Pupils are equal, round, and reactive to light.  Neck:     Thyroid: No thyromegaly.  Cardiovascular:     Rate and Rhythm: Normal rate and regular rhythm.     Pulses: Intact distal pulses.     Heart sounds: Normal heart sounds. No murmur heard.   Pulmonary:     Effort: Pulmonary effort is normal.     Breath sounds: Normal breath sounds.  Abdominal:     General: Bowel sounds are normal. There is no distension.     Palpations: Abdomen is soft.     Tenderness: There is no abdominal tenderness.  Musculoskeletal:     Cervical back: Normal range of motion and neck supple.  Lymphadenopathy:     Cervical: No cervical adenopathy.  Skin:    General: Skin is warm and dry.     Findings: No rash.  Neurological:     General: No focal deficit present.     Mental Status: She is alert and oriented to person, place, and time.     Cranial Nerves: No cranial nerve deficit.     Coordination: Coordination normal.     Deep Tendon Reflexes: Reflexes normal.  Psychiatric:        Mood and Affect: Mood and affect and mood normal.        Behavior: Behavior normal.        Assessment/plan: 1. Pre-diabetes Routine labs today. She has gained nearly 10 pounds. She is starting a diet tomorrow with weight loss competition. Really want her to work hard on diet and weight loss.  - Hemoglobin A1c    This visit occurred during the SARS-CoV-2 public health emergency.  Safety protocols were in place, including screening questions prior to the visit, additional usage of staff PPE, and extensive cleaning of exam room while observing appropriate contact time as indicated for disinfecting solutions.     Return in about 6 months (around 05/31/2021) for annual/prediabetes .   Orma Flaming, MD [place]   12/01/2020

## 2020-12-02 ENCOUNTER — Ambulatory Visit: Payer: No Typology Code available for payment source

## 2020-12-13 MED FILL — OXYBUTYNIN CL ER 10 MG TAB: 10 | 90 days supply | Qty: 90 | Fill #2

## 2021-01-10 ENCOUNTER — Other Ambulatory Visit: Payer: Self-pay

## 2021-01-10 ENCOUNTER — Ambulatory Visit
Admission: RE | Admit: 2021-01-10 | Discharge: 2021-01-10 | Disposition: A | Discharge status: Home / Self Care | Payer: No Typology Code available for payment source | Source: Ambulatory Visit | Attending: Obstetrics and Gynecology | Admitting: Obstetrics and Gynecology

## 2021-01-10 DIAGNOSIS — Z1231 Encounter for screening mammogram for malignant neoplasm of breast: Secondary | ICD-10-CM

## 2021-02-01 ENCOUNTER — Other Ambulatory Visit: Payer: Self-pay | Admitting: Physician Assistant

## 2021-02-01 ENCOUNTER — Other Ambulatory Visit: Payer: Self-pay

## 2021-02-01 MED ORDER — OXCARBAZEPINE 300 MG PO TABS: 300.0000 mg | ORAL_TABLET | Freq: Every day | ORAL | 1 refills | Status: DC

## 2021-02-01 MED FILL — LAMOTRIGINE 100 MG TABS: 100 | 90 days supply | Qty: 180 | Fill #0

## 2021-02-01 MED FILL — buPROPion HCL ER (XL) 300 M: 300 | 90 days supply | Qty: 90 | Fill #0

## 2021-02-01 MED FILL — OXcarbazepine 300 MG TABS: 300 | 90 days supply | Qty: 90 | Fill #0

## 2021-02-15 ENCOUNTER — Other Ambulatory Visit (HOSPITAL_BASED_OUTPATIENT_CLINIC_OR_DEPARTMENT_OTHER): Payer: Self-pay

## 2021-02-15 LAB — HM PAP SMEAR: HPV, high-risk: NEGATIVE

## 2021-02-17 ENCOUNTER — Other Ambulatory Visit (HOSPITAL_BASED_OUTPATIENT_CLINIC_OR_DEPARTMENT_OTHER): Payer: Self-pay

## 2021-02-23 MED FILL — VALACYCLOVIR HCL 500 MG TAB: 500 | 3 days supply | Qty: 6 | Fill #1

## 2021-02-27 ENCOUNTER — Other Ambulatory Visit (HOSPITAL_COMMUNITY): Payer: Self-pay

## 2021-03-07 ENCOUNTER — Other Ambulatory Visit (HOSPITAL_COMMUNITY): Payer: Self-pay

## 2021-03-19 MED FILL — Oxybutynin Chloride Tab ER 24HR 10 MG: ORAL | 90 days supply | Qty: 90 | Fill #0 | Status: AC

## 2021-03-20 ENCOUNTER — Other Ambulatory Visit (HOSPITAL_COMMUNITY): Payer: Self-pay

## 2021-03-22 ENCOUNTER — Other Ambulatory Visit: Payer: Self-pay | Admitting: Family Medicine

## 2021-03-22 ENCOUNTER — Other Ambulatory Visit (HOSPITAL_COMMUNITY): Payer: Self-pay

## 2021-03-22 MED ORDER — VALACYCLOVIR HCL 500 MG PO TABS
ORAL_TABLET | ORAL | 1 refills | Status: DC
  Filled 2021-03-22: qty 30, 15d supply, fill #0

## 2021-04-26 ENCOUNTER — Other Ambulatory Visit (HOSPITAL_COMMUNITY): Payer: Self-pay

## 2021-05-12 ENCOUNTER — Encounter: Payer: Self-pay | Admitting: Physician Assistant

## 2021-05-12 ENCOUNTER — Other Ambulatory Visit: Payer: Self-pay

## 2021-05-12 ENCOUNTER — Ambulatory Visit: Payer: No Typology Code available for payment source | Admitting: Physician Assistant

## 2021-05-12 DIAGNOSIS — F411 Generalized anxiety disorder: Secondary | ICD-10-CM

## 2021-05-12 DIAGNOSIS — F319 Bipolar disorder, unspecified: Secondary | ICD-10-CM

## 2021-05-12 NOTE — Progress Notes (Signed)
[facility] Med Check  Patient ID: Margaret [name],  MRN: [id number]  PCP: Orma Flaming, MD  Date of Evaluation: 05/12/2021 Time spent:30 minutes  Chief Complaint:  Chief Complaint   Depression; Follow-up      HISTORY/CURRENT STATUS: For routine med check.  Doesn't feel like she needs mood stabilizers now. 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 Doesn't have those stressors any more. Loves her job she's been for several years now.  She and her family just moved about 3 to 4 weeks ago to a new home which she is enjoying very much.  She has already started decreasing the Lamictal from 200 mg to 100 mg.  She would like to go off that and the Trileptal if possible.  Does not want to change the Wellbutrin.  She is not having any symptoms of depression but she does still have anxiety at times.  No panic attacks but more of a generalized sense of unease, especially if someone gets in her personal space.  An example is that one of her sons got really close to her face while talking and it made her anxious.  Patient denies loss of interest in usual activities and is able to enjoy things.  Denies decreased energy or motivation.  Appetite has not changed.  No extreme sadness, tearfulness, or feelings of hopelessness.  Denies any changes in concentration, making decisions or remembering things.  Denies suicidal or homicidal thoughts.  Patient denies increased energy with decreased need for sleep, no increased talkativeness, no racing thoughts, no impulsivity or risky behaviors, no increased spending, no increased libido, no grandiosity, no increased irritability or anger, and no hallucinations.  Denies dizziness, syncope, seizures, numbness, tingling, tremor, tics, unsteady gait, slurred speech, confusion. Denies muscle or joint pain, stiffness, or  dystonia.  Individual Medical History/ Review of Systems: Changes? :No    Past medications for mental health diagnoses include: Lexapro caused sexual side effects, Depakote, Effexor, Zoloft, Prozac, Trileptal, Lamictal, Wellbutrin  Allergies: Dilaudid [hydromorphone], Amoxicillin, Ancef [cefazolin], Penicillins, Adhesive [tape], and Keflex [cephalexin]  Current Medications:  Current Outpatient Medications:    buPROPion (WELLBUTRIN XL) 300 MG 24 hr tablet, TAKE 1 TABLET BY MOUTH ONCE DAILY, Disp: 90 tablet, Rfl: 1   docusate sodium (COLACE) 100 MG capsule, Take 100 mg by mouth 2 (two) times daily., Disp: , Rfl:    ibuprofen (ADVIL,MOTRIN) 200 MG tablet, Take 600 mg by mouth every 6 (six) hours as needed for headache or mild pain., Disp: , Rfl:    lamoTRIgine (LAMICTAL) 100 MG tablet, TAKE 2 TABLETS BY MOUTH DAILY (Patient taking differently: Take 100 mg by mouth daily.), Disp: 180 tablet, Rfl: 1   Oxcarbazepine (TRILEPTAL) 300 MG tablet, TAKE 1 TABLET BY MOUTH AT BEDTIME, Disp: 90 tablet, Rfl: 1   oxybutynin (DITROPAN-XL) 10 MG 24 hr tablet, Take 10 mg by mouth at bedtime., Disp: , Rfl:    oxybutynin (DITROPAN-XL) 10 MG 24 hr tablet, TAKE 1 TABLET BY MOUTH AT BEDTIME, Disp: 90 tablet, Rfl: 3   sennosides-docusate sodium (SENOKOT-S) 8.6-50 MG tablet, Take 1 tablet by mouth 2 (two) times daily., Disp: , Rfl:    valACYclovir (VALTREX) 500 MG tablet, TAKE 1 TABLET (500 MG TOTAL) BY MOUTH 2 (TWO) TIMES DAILY. FOR 3 DAYS, Disp: 30 tablet, Rfl: 1   albuterol (PROVENTIL HFA;VENTOLIN HFA) 108 (90 BASE) MCG/ACT  inhaler, Inhale 2 puffs into the lungs every 6 (six) hours as needed for wheezing or shortness of breath. (Patient not taking: Reported on 05/12/2021), Disp: , Rfl:  Medication Side Effects: none  Family Medical/ Social History: Changes? Sold their house, moved, trying to get settled in.   MENTAL HEALTH EXAM:  Last menstrual period 11/04/2018.There is no height or weight on file to calculate  BMI.  General Appearance: Casual, Neat, Well Groomed and Obese  Eye Contact:  Good  Speech:  Clear and Coherent and Normal Rate  Volume:  Normal  Mood:  Euthymic  Affect:  Appropriate  Thought Process:  Goal Directed and Descriptions of Associations: Circumstantial  Orientation:  Full (Time, Place, and Person)  Thought Content: Logical   Suicidal Thoughts:  No  Homicidal Thoughts:  No  Memory:  WNL  Judgement:  Good  Insight:  Good  Psychomotor Activity:  Normal  Concentration:  Concentration: Good  Recall:  Good  Fund of Knowledge: Good  Language: Good  Assets:  Desire for Improvement  ADL's:  Intact  Cognition: WNL  Prognosis:  Good    DIAGNOSES:    ICD-10-CM   1. Bipolar I disorder ([place])  F31.9     2. Generalized anxiety disorder  F41.1        Receiving Psychotherapy: No    RECOMMENDATIONS:  PDMP reviewed. I provided 30 minutes of face to face time during this encounter, including time spent before and after the visit in records review, medical decision making, counseling, and charting.  We discussed weaning off the Lamictal and Trileptal.  She is on a very low-dose of Trileptal anyway so I doubt that she will feel any changes once she is off that.  She will go off Lamictal first since she is already started that and does not feel depressed and really never has. Will keep her on the Wellbutrin for several reasons, anxiety, appetite suppressant, and weight loss. With bipolar disorder she may or may not have symptoms after being off the mood stabilizers.  We will have to wait and see how she does.  Warned her not to restart Lamictal without discussing it with me first, as there is a remote possibility of Stevens-Johnson syndrome when starting Lamictal, and especially if high doses started.  She verbalizes understanding. Wean off Lamictal 100 mg by taking 1/2 pill every evening for 2 weeks and then stop. Wean off Trileptal 300 mg by taking 1/2 pill daily for 2 weeks and  then stop she will begin that taper after she is completely off Lamictal. Continue Wellbutrin XL 300 mg daily. Return in 6 months.  Donnal Moat, PA-C

## 2021-05-12 NOTE — Patient Instructions (Signed)
Wean off Lamictal 100 mg by taking 1/2 pill every evening for 2 weeks, then stop.  After getting off the Lamictal, wean the Trileptal 300 mg by taking 1/2 pill daily for 2 weeks, then stop.

## 2021-05-17 ENCOUNTER — Other Ambulatory Visit (HOSPITAL_COMMUNITY): Payer: Self-pay

## 2021-05-17 MED ORDER — BETAMETHASONE DIPROPIONATE AUG 0.05 % EX OINT
TOPICAL_OINTMENT | CUTANEOUS | 0 refills | Status: DC
  Filled 2021-05-17: qty 50, 14d supply, fill #0

## 2021-05-18 ENCOUNTER — Other Ambulatory Visit (HOSPITAL_COMMUNITY): Payer: Self-pay

## 2021-05-30 ENCOUNTER — Other Ambulatory Visit (HOSPITAL_COMMUNITY): Payer: Self-pay

## 2021-05-31 ENCOUNTER — Other Ambulatory Visit (HOSPITAL_COMMUNITY): Payer: Self-pay

## 2021-06-02 ENCOUNTER — Encounter: Payer: No Typology Code available for payment source | Admitting: Family Medicine

## 2021-06-21 ENCOUNTER — Other Ambulatory Visit: Payer: Self-pay | Admitting: Family Medicine

## 2021-06-21 ENCOUNTER — Other Ambulatory Visit (HOSPITAL_COMMUNITY): Payer: Self-pay

## 2021-06-21 ENCOUNTER — Other Ambulatory Visit: Payer: Self-pay

## 2021-06-21 MED ORDER — OXYBUTYNIN CHLORIDE ER 10 MG PO TB24
10.0000 mg | ORAL_TABLET | Freq: Every day | ORAL | 0 refills | Status: DC
  Filled 2021-06-21: qty 90, 90d supply, fill #0

## 2021-06-21 MED FILL — Bupropion HCl Tab ER 24HR 300 MG: ORAL | 90 days supply | Qty: 90 | Fill #0 | Status: AC

## 2021-06-27 ENCOUNTER — Ambulatory Visit (INDEPENDENT_AMBULATORY_CARE_PROVIDER_SITE_OTHER): Payer: BC Managed Care – PPO | Admitting: Registered Nurse

## 2021-06-27 ENCOUNTER — Encounter: Payer: Self-pay | Admitting: Registered Nurse

## 2021-06-27 ENCOUNTER — Other Ambulatory Visit: Payer: Self-pay

## 2021-06-27 ENCOUNTER — Other Ambulatory Visit (HOSPITAL_COMMUNITY): Payer: Self-pay

## 2021-06-27 VITALS — BP 132/74 | HR 78 | Temp 98.4°F | Resp 18 | Ht 63.0 in | Wt 210.0 lb

## 2021-06-27 DIAGNOSIS — R21 Rash and other nonspecific skin eruption: Secondary | ICD-10-CM

## 2021-06-27 MED ORDER — PREDNISONE 10 MG PO TABS
ORAL_TABLET | ORAL | 0 refills | Status: AC
  Filled 2021-06-27: qty 18, 9d supply, fill #0

## 2021-06-27 MED ORDER — HYDROXYZINE HCL 10 MG PO TABS
5.0000 mg | ORAL_TABLET | Freq: Three times a day (TID) | ORAL | 0 refills | Status: DC | PRN
  Filled 2021-06-27: qty 30, 3d supply, fill #0

## 2021-07-05 ENCOUNTER — Other Ambulatory Visit: Payer: Self-pay

## 2021-07-05 ENCOUNTER — Encounter: Payer: Self-pay | Admitting: Physician Assistant

## 2021-07-05 ENCOUNTER — Ambulatory Visit (INDEPENDENT_AMBULATORY_CARE_PROVIDER_SITE_OTHER): Payer: BC Managed Care – PPO | Admitting: Physician Assistant

## 2021-07-05 VITALS — BP 137/81 | HR 79 | Temp 97.8°F | Ht 63.0 in | Wt 208.2 lb

## 2021-07-05 DIAGNOSIS — E7800 Pure hypercholesterolemia, unspecified: Secondary | ICD-10-CM | POA: Diagnosis not present

## 2021-07-05 DIAGNOSIS — R7303 Prediabetes: Secondary | ICD-10-CM

## 2021-07-05 LAB — COMPREHENSIVE METABOLIC PANEL
ALT: 14 U/L (ref 0–35)
AST: 14 U/L (ref 0–37)
Albumin: 4.3 g/dL (ref 3.5–5.2)
Alkaline Phosphatase: 43 U/L (ref 39–117)
BUN: 10 mg/dL (ref 6–23)
CO2: 24 mEq/L (ref 19–32)
Calcium: 9.1 mg/dL (ref 8.4–10.5)
Chloride: 103 mEq/L (ref 96–112)
Creatinine, Ser: 0.73 mg/dL (ref 0.40–1.20)
GFR: 97.94 mL/min (ref >60.00)
Glucose, Bld: 85 mg/dL (ref 70–99)
Potassium: 3.6 mEq/L (ref 3.5–5.1)
Sodium: 138 mEq/L (ref 135–145)
Total Bilirubin: 0.4 mg/dL (ref 0.2–1.2)
Total Protein: 7.1 g/dL (ref 6.0–8.3)

## 2021-07-05 LAB — LIPID PANEL
Cholesterol: 212 mg/dL — ABNORMAL HIGH (ref 0–200)
HDL: 60.3 mg/dL (ref >39.00)
LDL Cholesterol: 124 mg/dL — ABNORMAL HIGH (ref 0–99)
NonHDL: 151.27
Total CHOL/HDL Ratio: 4
Triglycerides: 137 mg/dL (ref 0.0–149.0)
VLDL: 27.4 mg/dL (ref 0.0–40.0)

## 2021-07-05 LAB — HEMOGLOBIN A1C: Hgb A1c MFr Bld: 6.4 % (ref 4.6–6.5)

## 2021-07-05 NOTE — Progress Notes (Signed)
Established Patient Office Visit  Subjective:  Patient ID: Margaret [name], female    DOB: [birth year]  Age: 45 y.o. MRN: [id number]  CC:  Chief Complaint  Patient presents with   Diabetes    HPI Margaret [name] presents for [facility] visit from Dr. Rogers Blocker.  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  She has been monitoring her sugar and cholesterol levels every 6 months.  She states that she has been having trouble losing weight, but she also has not been really trying.  She has been really busy with renovating a house, but otherwise no additional exercise at this time.  She is still snacking and says her downfall is chocolate. Not taking any specific medications for these issues.   Past Medical History:  Diagnosis Date   Abrasion of right thumb 04/19/2015   Anxiety    Carpal tunnel syndrome on both sides 0000000   Complication of anesthesia    Dental crown present    Diabetes mellitus without complication (HCC)    Exercise-induced asthma    prn inhaler  seasonal   Headache associated with hormonal factors    Heart murmur    slight mummur   History of endometrial ablation    History of partial hysterectomy 2019   Hyperlipidemia    Low serum iron    no current med.   Obesity    PONV (postoperative nausea and vomiting)    nausea only   Pre-diabetes    PVCs (premature ventricular contractions)    states are benign   Seasonal allergies    Trigger finger     Past Surgical History:  Procedure Laterality Date   ABDOMINAL HYSTERECTOMY     Laparoscopic assisted Dr. Arvella Nigh 09-22-18   BREAST REDUCTION SURGERY Bilateral 10/27/2013   Procedure: MAMMARY REDUCTION  (BREAST) BILATERAL;  Surgeon: Erline Hau, MD;  Location: [place];  Service: Plastics;  Laterality: Bilateral;   CARPAL TUNNEL RELEASE Bilateral 04/22/2015   Procedure: BILATERAL CARPAL TUNNEL RELEASE;  Surgeon: Roseanne Kaufman, MD;  Location: [place];  Service:  Orthopedics;  Laterality: Bilateral;   CESAREAN SECTION  04/23/2002   twins   CESAREAN SECTION  03/28/2005   DILATION AND CURETTAGE OF UTERUS     LAPAROSCOPIC VAGINAL HYSTERECTOMY WITH SALPINGECTOMY Bilateral 09/22/2018   Procedure: ABDOMINAL HYSTERECTOMY WITH BILATERAL SALPINGECTOMY;  Surgeon: Arvella Nigh, MD;  Location: [place];  Service: Gynecology;  Laterality: Bilateral;  need bedL;  CONVERTED TO OPEN ABDOMINAL HYSTERECTOMY AT 0840   LASIK     REDUCTION MAMMAPLASTY Bilateral 2014   TRIGGER FINGER RELEASE     2019   TRIGGER FINGER RELEASE Right 2020   R middle finger   uterine ablation     02-03-2018    Family History  Problem Relation Age of Onset   COPD Mother    Hypertension Mother    Depression Mother    Diabetes Mother    Early death Mother    Hyperlipidemia Mother    Heart attack Father    Alcohol abuse Father    Early death Father    Mental illness Father    Cancer Other    COPD Other    Hypertension Other    Hyperlipidemia Other    Diabetes Other    Alcohol abuse Brother    Mental illness Brother    Alcohol abuse Maternal Grandmother    COPD Maternal Grandmother    Alcohol abuse Maternal Grandfather  Cancer Maternal Grandfather    COPD Maternal Grandfather    Diabetes Paternal Grandmother    Alcohol abuse Paternal Grandfather    Breast cancer Neg Hx     Social History   Socioeconomic History   Marital status: Married    Spouse name: Not on file   Number of children: Not on file   Years of education: Not on file   Highest education level: Not on file  Occupational History   Not on file  Tobacco Use   Smoking status: Never   Smokeless tobacco: Never  Vaping Use   Vaping Use: Never used  Substance and Sexual Activity   Alcohol use: Yes    Comment: rarely  maybe 3 times a year   Drug use: Never   Sexual activity: Yes  Other Topics Concern   Not on file  Social History Narrative   Not on file   Social Determinants of  Health   Financial Resource Strain: Not on file  Food Insecurity: Not on file  Transportation Needs: Not on file  Physical Activity: Not on file  Stress: Not on file  Social Connections: Not on file  Intimate Partner Violence: Not on file    Outpatient Medications Prior to Visit  Medication Sig Dispense Refill   albuterol (PROVENTIL HFA;VENTOLIN HFA) 108 (90 BASE) MCG/ACT inhaler Inhale 2 puffs into the lungs every 6 (six) hours as needed for wheezing or shortness of breath.     augmented betamethasone dipropionate (DIPROLENE-AF) 0.05 % ointment Apply to affected area on the skin 2 times a day as needed for inflammation 50 g 0   buPROPion (WELLBUTRIN XL) 300 MG 24 hr tablet TAKE 1 TABLET BY MOUTH ONCE DAILY 90 tablet 1   docusate sodium (COLACE) 100 MG capsule Take 100 mg by mouth 2 (two) times daily.     ibuprofen (ADVIL,MOTRIN) 200 MG tablet Take 600 mg by mouth every 6 (six) hours as needed for headache or mild pain.     oxybutynin (DITROPAN-XL) 10 MG 24 hr tablet Take 1 tablet (10 mg total) by mouth at bedtime. 90 tablet 0   predniSONE (DELTASONE) 10 MG tablet Take 3 tablets (30 mg total) by mouth daily with breakfast for 3 days, 2 tablets (20 mg total) daily with breakfast for 3 days, THEN 1 tablet (10 mg total) daily with breakfast for 3 days. 18 tablet 0   sennosides-docusate sodium (SENOKOT-S) 8.6-50 MG tablet Take 1 tablet by mouth 2 (two) times daily.     valACYclovir (VALTREX) 500 MG tablet TAKE 1 TABLET (500 MG TOTAL) BY MOUTH 2 (TWO) TIMES DAILY. FOR 3 DAYS 30 tablet 1   hydrOXYzine (ATARAX/VISTARIL) 10 MG tablet Take 1/2-3 tablets (5-30 mg total) by mouth 3 (three) times daily as needed. 30 tablet 0   lamoTRIgine (LAMICTAL) 100 MG tablet TAKE 2 TABLETS BY MOUTH DAILY (Patient taking differently: Take 50 mg by mouth daily.) 180 tablet 1   Oxcarbazepine (TRILEPTAL) 300 MG tablet TAKE 1 TABLET BY MOUTH AT BEDTIME (Patient not taking: Reported on 06/27/2021) 90 tablet 1   No  facility-administered medications prior to visit.    Allergies  Allergen Reactions   Dilaudid [Hydromorphone] Itching   Amoxicillin Hives and Swelling    Has patient had a PCN reaction causing immediate rash, facial/tongue/throat swelling, SOB or lightheadedness with hypotension: Yes Has patient had a PCN reaction causing severe rash involving mucus membranes or skin necrosis: No Has patient had a PCN reaction that required hospitalization: No Has  patient had a PCN reaction occurring within the last 10 years: Yes If all of the above answers are "NO", then may proceed with Cephalosporin use.   Ancef [Cefazolin] Hives   Penicillins Hives and Swelling    Has patient had a PCN reaction causing immediate rash, facial/tongue/throat swelling, SOB or lightheadedness with hypotension: Yes Has patient had a PCN reaction causing severe rash involving mucus membranes or skin necrosis: No Has patient had a PCN reaction that required hospitalization: No Has patient had a PCN reaction occurring within the last 10 years: Yes If all of the above answers are "NO", then may proceed with Cephalosporin use.   Adhesive [Tape] Other (See Comments)    SKIN IRRITATION   Keflex [Cephalexin] Rash    ROS Review of Systems  Constitutional:  Negative for chills, fatigue and fever.  HENT:  Negative for dental problem, ear pain, hearing loss and trouble swallowing.   Eyes:  Negative for visual disturbance.  Respiratory:  Negative for cough, chest tightness and shortness of breath.   Cardiovascular:  Negative for chest pain, palpitations and leg swelling.  Gastrointestinal:  Negative for abdominal pain, blood in stool, diarrhea and nausea.  Endocrine: Negative for cold intolerance, polydipsia, polyphagia and polyuria.  Genitourinary:  Negative for dysuria, frequency, hematuria and urgency.  Musculoskeletal:  Positive for back pain. Negative for arthralgias.  Skin:  Negative for rash.  Neurological:  Negative for  dizziness and headaches.  Psychiatric/Behavioral:  Negative for dysphoric mood and sleep disturbance. The patient is not nervous/anxious.      Objective:    Physical Exam Vitals reviewed.  Constitutional:      Appearance: Normal appearance. She is well-developed. She is obese.  HENT:     Head: Normocephalic and atraumatic.     Right Ear: External ear normal.     Left Ear: External ear normal.  Eyes:     Conjunctiva/sclera: Conjunctivae normal.     Pupils: Pupils are equal, round, and reactive to light.  Neck:     Thyroid: No thyromegaly.  Cardiovascular:     Rate and Rhythm: Normal rate and regular rhythm.     Heart sounds: Normal heart sounds. No murmur heard. Pulmonary:     Effort: Pulmonary effort is normal.     Breath sounds: Normal breath sounds.  Abdominal:     General: Bowel sounds are normal. There is no distension.     Palpations: Abdomen is soft.     Tenderness: There is no abdominal tenderness.  Musculoskeletal:     Cervical back: Normal range of motion and neck supple.  Lymphadenopathy:     Cervical: No cervical adenopathy.  Skin:    General: Skin is warm and dry.     Findings: No rash.  Neurological:     General: No focal deficit present.     Mental Status: She is alert and oriented to person, place, and time.     Cranial Nerves: No cranial nerve deficit.     Coordination: Coordination normal.     Deep Tendon Reflexes: Reflexes normal.  Psychiatric:        Mood and Affect: Mood normal.        Behavior: Behavior normal.    BP 137/81   Pulse 79   Temp 97.8 F (36.6 C)   Ht '5\' 3"'$  (1.6 m)   Wt 208 lb 3.2 oz (94.4 kg)   LMP 11/04/2018 (Within Weeks)   SpO2 97%   BMI 36.88 kg/m  Wt Readings from Last  3 Encounters:  07/05/21 208 lb 3.2 oz (94.4 kg)  06/27/21 210 lb (95.3 kg)  12/01/20 215 lb 6.4 oz (97.7 kg)     Health Maintenance Due  Topic Date Due   INFLUENZA VACCINE  06/26/2021    There are no preventive care reminders to display for this  patient.  Lab Results  Component Value Date   TSH 1.30 05/13/2020   Lab Results  Component Value Date   WBC 6.8 05/13/2020   HGB 12.5 05/13/2020   HCT 38.1 05/13/2020   MCV 84.3 05/13/2020   PLT 338 05/13/2020   Lab Results  Component Value Date   NA 137 05/13/2020   K 4.1 05/13/2020   CHLORIDE 106 10/07/2013   CO2 24 05/13/2020   GLUCOSE 91 05/13/2020   BUN 9 05/13/2020   CREATININE 0.88 05/13/2020   BILITOT 0.4 05/13/2020   ALKPHOS 44 10/07/2013   AST 13 05/13/2020   ALT 14 05/13/2020   PROT 6.9 05/13/2020   ALBUMIN 4.1 10/07/2013   CALCIUM 9.4 05/13/2020   ANIONGAP 7 09/15/2018   Lab Results  Component Value Date   CHOL 209 (H) 05/13/2020   Lab Results  Component Value Date   HDL 61 05/13/2020   Lab Results  Component Value Date   LDLCALC 127 (H) 05/13/2020   Lab Results  Component Value Date   TRIG 105 05/13/2020   Lab Results  Component Value Date   CHOLHDL 3.4 05/13/2020   Lab Results  Component Value Date   HGBA1C 6.0 12/01/2020      Assessment & Plan:   Problem List Items Addressed This Visit       Other   Pre-diabetes - Primary   Relevant Orders   Comprehensive metabolic panel   Hemoglobin A1c   Hypercholesteremia   Relevant Orders   Lipid panel    No orders of the defined types were placed in this encounter.   Follow-up: No follow-ups on file.   Plan: Labs today and will discuss treatment pending results. Strongly emphasized need to increase exercise and cut back on sweets / simple carbs. Recheck in 6-12 months.   Elyse Prevo M Piccola [place], PA-C

## 2021-07-05 NOTE — Patient Instructions (Addendum)
Good to meet you today! Please go to the lab for blood work and I will send results through [facility]. Look up Cologuard and let me know if you want me to get this ordered for you.   Keep working on increasing exercise and cutting back on sweets.

## 2021-07-19 ENCOUNTER — Other Ambulatory Visit (HOSPITAL_COMMUNITY): Payer: Self-pay

## 2021-07-21 ENCOUNTER — Other Ambulatory Visit (HOSPITAL_COMMUNITY): Payer: Self-pay

## 2021-08-01 ENCOUNTER — Other Ambulatory Visit (HOSPITAL_COMMUNITY): Payer: Self-pay

## 2021-08-17 ENCOUNTER — Telehealth: Payer: Self-pay

## 2021-08-17 NOTE — Telephone Encounter (Signed)
Nurse Assessment Nurse: Jimmye Norman, RN, [place] Date/Time (Eastern Time): 08/17/2021 9:38:19 AM Confirm and document reason for call. If symptomatic, describe symptoms. ---Caller states she begin having chest pain early this morning and nausea . Caller states she is also experiencing chest tightness, belching, her left pinky is numb and she is dry heaving . Does the patient have any new or worsening symptoms? ---Yes Will a triage be completed? ---Yes Related visit to physician within the last 2 weeks? ---No Does the PT have any chronic conditions? (i.e. diabetes, asthma, this includes High risk factors for pregnancy, etc.) ---Yes List chronic conditions. ---pre-diabetes, anxiety Is the patient pregnant or possibly pregnant? (Ask all females between the ages of 18-55) ---No Is this a behavioral health or substance abuse call? ---No PLEASE NOTE: All timestamps contained within this report are represented as [place] Standard Time. CONFIDENTIALTY NOTICE: This fax transmission is intended only for the addressee. It contains information that is legally privileged, confidential or otherwise protected from use or disclosure. If you are not the intended recipient, you are strictly prohibited from reviewing, disclosing, copying using or disseminating any of this information or taking any action in reliance on or regarding this information. If you have received this fax in error, please notify us immediately by telephone so that we can arrange for its return to [place]. Phone: [phone], Toll-Free: [phone], Fax: [phone] Page: 2 of 2 Call Id: [id number] Guidelines Guideline Title Affirmed Question Affirmed Notes Nurse Date/Time Eilene Ghazi Time) Chest Pain [1] Chest pain lasts > 5 minutes AND [2] age > 86 Jimmye Norman, [place], [facility] 08/17/2021 9:38:59 AM Disp. Time Eilene Ghazi Time) Disposition Final User 08/17/2021 9:37:09 AM Send to Urgent Tobie Lords 08/17/2021 9:45:44 AM 911 Outcome  Documentation Jimmye Norman, RN, Loree Fee Reason: Caller refused 911 outcome, follow up call unnecessary 08/17/2021 9:42:12 AM Call EMS 911 Now Yes Jimmye Norman, RN, Loree Fee Caller Disagree/Comply Disagree Caller Understands Yes PreDisposition InappropriateToAsk Care Advice Given Per Guideline CALL EMS 911 NOW: * Immediate medical attention is needed. You need to hang up and call 911 (or an ambulance). Comments User: Myriam Forehand, RN Date/Time Eilene Ghazi Time): 08/17/2021 9:45:00 AM RN spoke with backline representative with the office, informed of triage outcome and caller refusal and request to be seen in the office and wants to know if a stat troponin can be ran. States she will speak with the provider and call the caller back. Referrals GO TO FACILITY REFUSED

## 2021-08-17 NOTE — Telephone Encounter (Signed)
Spoke with Glass blower/designer, Tomasa Hosteller. I was informed that BP was 170/100 and pt was still having chest pain. Strongly advised ED visit right away.

## 2021-08-17 NOTE — Telephone Encounter (Signed)
Called patient back as promised, and advised her that we do not perform troponin, which is what she was requesting, at the office. She advise that she was feeling better.The pain had subsided and she felt confident that she did not have to go to the emergency room. 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  She agreed and we discontinued the call.

## 2021-09-09 NOTE — Progress Notes (Signed)
Established Patient Office Visit  Subjective:  Patient ID: Margaret [name], female    DOB: 06-21-1976  Age: 45 y.o. MRN: [id number]  CC:  Chief Complaint  Patient presents with   Rash    Patient states for two weeks she noticed a rash all over her body that she thought was poison ivy but has spreaded more.     HPI SHAMECKA HOCUTT presents for rash.   Red, warm, itching. Started on trunk, thought to be plant exposure dermatitis Unfortunately kept spreading No mucus membrane involvment No new hygiene products or changes at home. No other symptoms.  Past Medical History:  Diagnosis Date   Abrasion of right thumb 04/19/2015   Anxiety    Carpal tunnel syndrome on both sides 06/5884   Complication of anesthesia    Dental crown present    Diabetes mellitus without complication (HCC)    Exercise-induced asthma    prn inhaler  seasonal   Headache associated with hormonal factors    Heart murmur    slight mummur   History of endometrial ablation    History of partial hysterectomy 2019   Hyperlipidemia    Low serum iron    no current med.   Obesity    PONV (postoperative nausea and vomiting)    nausea only   Pre-diabetes    PVCs (premature ventricular contractions)    states are benign   Seasonal allergies    Trigger finger     Past Surgical History:  Procedure Laterality Date   ABDOMINAL HYSTERECTOMY     Laparoscopic assisted Dr. Arvella Nigh 09-22-18   BREAST REDUCTION SURGERY Bilateral 10/27/2013   Procedure: MAMMARY REDUCTION  (BREAST) BILATERAL;  Surgeon: Erline Hau, MD;  Location: [place];  Service: Plastics;  Laterality: Bilateral;   CARPAL TUNNEL RELEASE Bilateral 04/22/2015   Procedure: BILATERAL CARPAL TUNNEL RELEASE;  Surgeon: Roseanne Kaufman, MD;  Location: [facility];  Service: Orthopedics;  Laterality: Bilateral;   CESAREAN SECTION  04/23/2002   twins   CESAREAN SECTION  03/28/2005   DILATION AND CURETTAGE OF UTERUS      LAPAROSCOPIC VAGINAL HYSTERECTOMY WITH SALPINGECTOMY Bilateral 09/22/2018   Procedure: ABDOMINAL HYSTERECTOMY WITH BILATERAL SALPINGECTOMY;  Surgeon: Arvella Nigh, MD;  Location: [place];  Service: Gynecology;  Laterality: Bilateral;  need bedL;  CONVERTED TO OPEN ABDOMINAL HYSTERECTOMY AT 0840   LASIK     REDUCTION MAMMAPLASTY Bilateral 2014   TRIGGER FINGER RELEASE     2019   TRIGGER FINGER RELEASE Right 2020   R middle finger   uterine ablation     02-03-2018    Family History  Problem Relation Age of Onset   COPD Mother    Hypertension Mother    Depression Mother    Diabetes Mother    Early death Mother    Hyperlipidemia Mother    Heart attack Father    Alcohol abuse Father    Early death Father    Mental illness Father    Cancer Other    COPD Other    Hypertension Other    Hyperlipidemia Other    Diabetes Other    Alcohol abuse Brother    Mental illness Brother    Alcohol abuse Maternal Grandmother    COPD Maternal Grandmother    Alcohol abuse Maternal Grandfather    Cancer Maternal Grandfather    COPD Maternal Grandfather    Diabetes Paternal Grandmother    Alcohol abuse Paternal Merchant navy officer  Breast cancer Neg Hx     Social History   Socioeconomic History   Marital status: Married    Spouse name: Not on file   Number of children: Not on file   Years of education: Not on file   Highest education level: Not on file  Occupational History   Not on file  Tobacco Use   Smoking status: Never   Smokeless tobacco: Never  Vaping Use   Vaping Use: Never used  Substance and Sexual Activity   Alcohol use: Yes    Comment: rarely  maybe 3 times a year   Drug use: Never   Sexual activity: Yes  Other Topics Concern   Not on file  Social History Narrative   Not on file   Social Determinants of Health   Financial Resource Strain: Not on file  Food Insecurity: Not on file  Transportation Needs: Not on file  Physical Activity: Not on file   Stress: Not on file  Social Connections: Not on file  Intimate Partner Violence: Not on file    Outpatient Medications Prior to Visit  Medication Sig Dispense Refill   augmented betamethasone dipropionate (DIPROLENE-AF) 0.05 % ointment Apply to affected area on the skin 2 times a day as needed for inflammation 50 g 0   buPROPion (WELLBUTRIN XL) 300 MG 24 hr tablet TAKE 1 TABLET BY MOUTH ONCE DAILY 90 tablet 1   docusate sodium (COLACE) 100 MG capsule Take 100 mg by mouth 2 (two) times daily.     ibuprofen (ADVIL,MOTRIN) 200 MG tablet Take 600 mg by mouth every 6 (six) hours as needed for headache or mild pain.     lamoTRIgine (LAMICTAL) 100 MG tablet TAKE 2 TABLETS BY MOUTH DAILY (Patient taking differently: Take 50 mg by mouth daily.) 180 tablet 1   oxybutynin (DITROPAN-XL) 10 MG 24 hr tablet Take 1 tablet (10 mg total) by mouth at bedtime. 90 tablet 0   sennosides-docusate sodium (SENOKOT-S) 8.6-50 MG tablet Take 1 tablet by mouth 2 (two) times daily.     valACYclovir (VALTREX) 500 MG tablet TAKE 1 TABLET (500 MG TOTAL) BY MOUTH 2 (TWO) TIMES DAILY. FOR 3 DAYS 30 tablet 1   albuterol (PROVENTIL HFA;VENTOLIN HFA) 108 (90 BASE) MCG/ACT inhaler Inhale 2 puffs into the lungs every 6 (six) hours as needed for wheezing or shortness of breath.     Oxcarbazepine (TRILEPTAL) 300 MG tablet TAKE 1 TABLET BY MOUTH AT BEDTIME (Patient not taking: Reported on 06/27/2021) 90 tablet 1   No facility-administered medications prior to visit.    Allergies  Allergen Reactions   Dilaudid [Hydromorphone] Itching   Amoxicillin Hives and Swelling    Has patient had a PCN reaction causing immediate rash, facial/tongue/throat swelling, SOB or lightheadedness with hypotension: Yes Has patient had a PCN reaction causing severe rash involving mucus membranes or skin necrosis: No Has patient had a PCN reaction that required hospitalization: No Has patient had a PCN reaction occurring within the last 10 years: Yes If  all of the above answers are "NO", then may proceed with Cephalosporin use.   Ancef [Cefazolin] Hives   Penicillins Hives and Swelling    Has patient had a PCN reaction causing immediate rash, facial/tongue/throat swelling, SOB or lightheadedness with hypotension: Yes Has patient had a PCN reaction causing severe rash involving mucus membranes or skin necrosis: No Has patient had a PCN reaction that required hospitalization: No Has patient had a PCN reaction occurring within the last 10 years: Yes  If all of the above answers are "NO", then may proceed with Cephalosporin use.   Adhesive [Tape] Other (See Comments)    SKIN IRRITATION   Keflex [Cephalexin] Rash    ROS Review of Systems Per hpi    Objective:    Physical Exam Vitals and nursing note reviewed.  Constitutional:      General: She is not in acute distress.    Appearance: Normal appearance. She is normal weight. She is not ill-appearing, toxic-appearing or diaphoretic.  Cardiovascular:     Rate and Rhythm: Normal rate and regular rhythm.     Heart sounds: Normal heart sounds. No murmur heard.   No friction rub. No gallop.  Pulmonary:     Effort: Pulmonary effort is normal. No respiratory distress.     Breath sounds: Normal breath sounds. No stridor. No wheezing, rhonchi or rales.  Chest:     Chest wall: No tenderness.  Skin:    General: Skin is warm and dry.     Findings: Rash (disseminated poorly differntiated red flat rash) present.  Neurological:     General: No focal deficit present.     Mental Status: She is alert and oriented to person, place, and time. Mental status is at baseline.  Psychiatric:        Mood and Affect: Mood normal.        Behavior: Behavior normal.        Thought Content: Thought content normal.        Judgment: Judgment normal.    BP 132/74   Pulse 78   Temp 98.4 F (36.9 C) (Temporal)   Resp 18   Ht 5\' 3"  (1.6 m)   Wt 210 lb (95.3 kg)   LMP 11/04/2018 (Within Weeks)   SpO2 100%    BMI 37.20 kg/m  Wt Readings from Last 3 Encounters:  07/05/21 208 lb 3.2 oz (94.4 kg)  06/27/21 210 lb (95.3 kg)  12/01/20 215 lb 6.4 oz (97.7 kg)     Health Maintenance Due  Topic Date Due   INFLUENZA VACCINE  06/26/2021    There are no preventive care reminders to display for this patient.  Lab Results  Component Value Date   TSH 1.30 05/13/2020   Lab Results  Component Value Date   WBC 6.8 05/13/2020   HGB 12.5 05/13/2020   HCT 38.1 05/13/2020   MCV 84.3 05/13/2020   PLT 338 05/13/2020   Lab Results  Component Value Date   NA 138 07/05/2021   K 3.6 07/05/2021   CHLORIDE 106 10/07/2013   CO2 24 07/05/2021   GLUCOSE 85 07/05/2021   BUN 10 07/05/2021   CREATININE 0.73 07/05/2021   BILITOT 0.4 07/05/2021   ALKPHOS 43 07/05/2021   AST 14 07/05/2021   ALT 14 07/05/2021   PROT 7.1 07/05/2021   ALBUMIN 4.3 07/05/2021   CALCIUM 9.1 07/05/2021   ANIONGAP 7 09/15/2018   GFR 97.94 07/05/2021   Lab Results  Component Value Date   CHOL 212 (H) 07/05/2021   Lab Results  Component Value Date   HDL 60.30 07/05/2021   Lab Results  Component Value Date   LDLCALC 124 (H) 07/05/2021   Lab Results  Component Value Date   TRIG 137.0 07/05/2021   Lab Results  Component Value Date   CHOLHDL 4 07/05/2021   Lab Results  Component Value Date   HGBA1C 6.4 07/05/2021      Assessment & Plan:   Problem List Items Addressed This Visit  None Visit Diagnoses     Rash and nonspecific skin eruption    -  Primary       Meds ordered this encounter  Medications   predniSONE (DELTASONE) 10 MG tablet    Sig: Take 3 tablets (30 mg total) by mouth daily with breakfast for 3 days, 2 tablets (20 mg total) daily with breakfast for 3 days, THEN 1 tablet (10 mg total) daily with breakfast for 3 days.    Dispense:  18 tablet    Refill:  0    Order Specific Question:   Supervising Provider    Answer:   Carlota Raspberry, JEFFREY R [[id number]]   DISCONTD: hydrOXYzine (ATARAX/VISTARIL) 10  MG tablet    Sig: Take 1/2-3 tablets (5-30 mg total) by mouth 3 (three) times daily as needed.    Dispense:  30 tablet    Refill:  0    Order Specific Question:   Supervising Provider    Answer:   Carlota Raspberry, JEFFREY R [[id number]]    Follow-up: No follow-ups on file.   PLAN Unclear etiology. Will give prednisone taper and hydroxyzine as above.  Suggest rechecking for any triggers for rash, washing all clothes and linens that may have been exposed to irritant, and close follow up with persistent symptoms Patient encouraged to call clinic with any questions, comments, or concerns.  Maximiano Coss, NP

## 2021-09-27 ENCOUNTER — Other Ambulatory Visit: Payer: Self-pay

## 2021-10-04 ENCOUNTER — Other Ambulatory Visit: Payer: Self-pay

## 2021-10-04 MED ORDER — BUPROPION HCL ER (XL) 300 MG PO TB24: ORAL_TABLET | Freq: Every day | ORAL | 1 refills | Status: DC

## 2021-10-10 ENCOUNTER — Telehealth: Payer: Self-pay

## 2021-10-10 ENCOUNTER — Other Ambulatory Visit: Payer: Self-pay

## 2021-10-10 ENCOUNTER — Other Ambulatory Visit (HOSPITAL_COMMUNITY): Payer: Self-pay

## 2021-10-10 MED ORDER — OXYBUTYNIN CHLORIDE ER 10 MG PO TB24: 10.0000 mg | ORAL_TABLET | Freq: Every day | ORAL | 0 refills | Status: DC

## 2021-10-10 MED ORDER — OXYBUTYNIN CHLORIDE ER 10 MG PO TB24
10.0000 mg | ORAL_TABLET | Freq: Every day | ORAL | 0 refills | Status: DC
  Filled 2021-10-10: qty 90, 90d supply, fill #0

## 2021-10-10 NOTE — Telephone Encounter (Signed)
Pt called back. Prescription was called to the wrong pharmacy. Prescription needs to be sent to [facility], [place]

## 2021-10-10 NOTE — Telephone Encounter (Signed)
Rx sent in

## 2021-10-10 NOTE — Telephone Encounter (Signed)
LAST APPOINTMENT DATE:  07/05/21  NEXT APPOINTMENT DATE: 01/05/22  MEDICATION:oxybutynin (DITROPAN-XL) 10 MG 24 hr tablet  PHARMACY: [place], [place]

## 2021-11-10 ENCOUNTER — Ambulatory Visit: Payer: No Typology Code available for payment source | Admitting: Physician Assistant

## 2021-12-13 ENCOUNTER — Other Ambulatory Visit: Payer: Self-pay | Admitting: Obstetrics and Gynecology

## 2021-12-13 DIAGNOSIS — Z1231 Encounter for screening mammogram for malignant neoplasm of breast: Secondary | ICD-10-CM

## 2022-01-05 ENCOUNTER — Other Ambulatory Visit: Payer: Self-pay

## 2022-01-05 ENCOUNTER — Ambulatory Visit (INDEPENDENT_AMBULATORY_CARE_PROVIDER_SITE_OTHER): Payer: BC Managed Care – PPO | Admitting: Physician Assistant

## 2022-01-05 VITALS — BP 132/94 | HR 71 | Temp 98.0°F | Ht 63.0 in | Wt 212.4 lb

## 2022-01-05 DIAGNOSIS — R21 Rash and other nonspecific skin eruption: Secondary | ICD-10-CM

## 2022-01-05 DIAGNOSIS — R7303 Prediabetes: Secondary | ICD-10-CM

## 2022-01-05 DIAGNOSIS — R202 Paresthesia of skin: Secondary | ICD-10-CM

## 2022-01-05 DIAGNOSIS — E7800 Pure hypercholesterolemia, unspecified: Secondary | ICD-10-CM | POA: Diagnosis not present

## 2022-01-05 DIAGNOSIS — Z1211 Encounter for screening for malignant neoplasm of colon: Secondary | ICD-10-CM

## 2022-01-05 DIAGNOSIS — Z0000 Encounter for general adult medical examination without abnormal findings: Secondary | ICD-10-CM

## 2022-01-05 LAB — CBC WITH DIFFERENTIAL/PLATELET
Basophils Absolute: 0 10*3/uL (ref 0.0–0.1)
Basophils Relative: 0.4 % (ref 0.0–3.0)
Eosinophils Absolute: 0.1 10*3/uL (ref 0.0–0.7)
Eosinophils Relative: 1 % (ref 0.0–5.0)
HCT: 37.7 % (ref 36.0–46.0)
Hemoglobin: 11.8 g/dL — ABNORMAL LOW (ref 12.0–15.0)
Lymphocytes Relative: 44.2 % (ref 12.0–46.0)
Lymphs Abs: 3.1 10*3/uL (ref 0.7–4.0)
MCHC: 31.3 g/dL (ref 30.0–36.0)
MCV: 76.7 fl — ABNORMAL LOW (ref 78.0–100.0)
Monocytes Absolute: 0.4 10*3/uL (ref 0.1–1.0)
Monocytes Relative: 6.3 % (ref 3.0–12.0)
Neutro Abs: 3.3 10*3/uL (ref 1.4–7.7)
Neutrophils Relative %: 48.1 % (ref 43.0–77.0)
Platelets: 302 10*3/uL (ref 150.0–400.0)
RBC: 4.91 Mil/uL (ref 3.87–5.11)
RDW: 17.2 % — ABNORMAL HIGH (ref 11.5–15.5)
WBC: 7 10*3/uL (ref 4.0–10.5)

## 2022-01-05 LAB — LIPID PANEL
Cholesterol: 171 mg/dL (ref 0–200)
HDL: 45.8 mg/dL (ref >39.00)
LDL Cholesterol: 103 mg/dL — ABNORMAL HIGH (ref 0–99)
NonHDL: 124.92
Total CHOL/HDL Ratio: 4
Triglycerides: 112 mg/dL (ref 0.0–149.0)
VLDL: 22.4 mg/dL (ref 0.0–40.0)

## 2022-01-05 LAB — COMPREHENSIVE METABOLIC PANEL
ALT: 11 U/L (ref 0–35)
AST: 10 U/L (ref 0–37)
Albumin: 4.1 g/dL (ref 3.5–5.2)
Alkaline Phosphatase: 37 U/L — ABNORMAL LOW (ref 39–117)
BUN: 11 mg/dL (ref 6–23)
CO2: 28 mEq/L (ref 19–32)
Calcium: 9.1 mg/dL (ref 8.4–10.5)
Chloride: 106 mEq/L (ref 96–112)
Creatinine, Ser: 0.73 mg/dL (ref 0.40–1.20)
GFR: 97.59 mL/min (ref >60.00)
Glucose, Bld: 99 mg/dL (ref 70–99)
Potassium: 4.1 mEq/L (ref 3.5–5.1)
Sodium: 139 mEq/L (ref 135–145)
Total Bilirubin: 0.5 mg/dL (ref 0.2–1.2)
Total Protein: 6.3 g/dL (ref 6.0–8.3)

## 2022-01-05 LAB — HEMOGLOBIN A1C: Hgb A1c MFr Bld: 6.3 % (ref 4.6–6.5)

## 2022-01-05 LAB — VITAMIN B12: Vitamin B-12: 203 pg/mL — ABNORMAL LOW (ref 211–911)

## 2022-01-05 LAB — VITAMIN D 25 HYDROXY (VIT D DEFICIENCY, FRACTURES): VITD: 11.98 ng/mL — ABNORMAL LOW (ref 30.00–100.00)

## 2022-01-05 LAB — TSH: TSH: 1.01 u[IU]/mL (ref 0.35–5.50)

## 2022-01-05 NOTE — Progress Notes (Signed)
Subjective:    Patient ID: Margaret [name], female    DOB: 1976-11-07, 46 y.o.   MRN: [id number]  Chief Complaint  Patient presents with   Annual Exam    HPI Patient is in today for annual exam.  Acute concerns: -Eczema, excessive dryness on hands; has derm appt, betamethasone and lotion doesn't seem to be helping much   -Some tingling in hands and feet  Health maintenance: Lifestyle/ exercise: Walks at work Nutrition: Doing ok Mental health: Sees psych annually; still taking Wellbutrin XL and doing well  Caffeine: Unsweet tea  Sleep: Doing ok  Substance use: None ETOH: None Immunizations: UTD  Colonoscopy: Needs a screening colonoscopy, not high risk  Pap: sees GYN Mammogram: sees GYN    Past Medical History:  Diagnosis Date   Abrasion of right thumb 04/19/2015   Anxiety    Carpal tunnel syndrome on both sides 04/3784   Complication of anesthesia    Dental crown present    Diabetes mellitus without complication ([place])    Exercise-induced asthma    prn inhaler  seasonal   Headache associated with hormonal factors    Heart murmur    slight mummur   History of endometrial ablation    History of partial hysterectomy 2019   Hyperlipidemia    Low serum iron    no current med.   Obesity    PONV (postoperative nausea and vomiting)    nausea only   Pre-diabetes    PVCs (premature ventricular contractions)    states are benign   Seasonal allergies    Trigger finger     Past Surgical History:  Procedure Laterality Date   ABDOMINAL HYSTERECTOMY     Laparoscopic assisted Dr. Arvella Nigh 09-22-18   BREAST REDUCTION SURGERY Bilateral 10/27/2013   Procedure: MAMMARY REDUCTION  (BREAST) BILATERAL;  Surgeon: Erline Hau, MD;  Location: [facility];  Service: Plastics;  Laterality: Bilateral;   CARPAL TUNNEL RELEASE Bilateral 04/22/2015   Procedure: BILATERAL CARPAL TUNNEL RELEASE;  Surgeon: Roseanne Kaufman, MD;  Location: [facility];   Service: Orthopedics;  Laterality: Bilateral;   CESAREAN SECTION  04/23/2002   twins   CESAREAN SECTION  03/28/2005   DILATION AND CURETTAGE OF UTERUS     LAPAROSCOPIC VAGINAL HYSTERECTOMY WITH SALPINGECTOMY Bilateral 09/22/2018   Procedure: ABDOMINAL HYSTERECTOMY WITH BILATERAL SALPINGECTOMY;  Surgeon: Arvella Nigh, MD;  Location: [facility];  Service: Gynecology;  Laterality: Bilateral;  need bedL;  CONVERTED TO OPEN ABDOMINAL HYSTERECTOMY AT 0840   LASIK     REDUCTION MAMMAPLASTY Bilateral 2014   TRIGGER FINGER RELEASE     2019   TRIGGER FINGER RELEASE Right 2020   R middle finger   uterine ablation     02-03-2018    Family History  Problem Relation Age of Onset   COPD Mother    Hypertension Mother    Depression Mother    Diabetes Mother    Early death Mother    Hyperlipidemia Mother    Heart attack Father    Alcohol abuse Father    Early death Father    Mental illness Father    Cancer Other    COPD Other    Hypertension Other    Hyperlipidemia Other    Diabetes Other    Alcohol abuse Brother    Mental illness Brother    Alcohol abuse Maternal Grandmother    COPD Maternal Grandmother    Alcohol abuse Maternal Grandfather    Cancer  Maternal Grandfather    COPD Maternal Grandfather    Diabetes Paternal Grandmother    Alcohol abuse Paternal Grandfather    Breast cancer Neg Hx     Social History   Tobacco Use   Smoking status: Never   Smokeless tobacco: Never  Vaping Use   Vaping Use: Never used  Substance Use Topics   Alcohol use: Yes    Comment: rarely  maybe 3 times a year   Drug use: Never     Allergies  Allergen Reactions   Dilaudid [Hydromorphone] Itching   Amoxicillin Hives and Swelling    Has patient had a PCN reaction causing immediate rash, facial/tongue/throat swelling, SOB or lightheadedness with hypotension: Yes Has patient had a PCN reaction causing severe rash involving mucus membranes or skin necrosis: No Has patient had a  PCN reaction that required hospitalization: No Has patient had a PCN reaction occurring within the last 10 years: Yes If all of the above answers are "NO", then may proceed with Cephalosporin use.   Ancef [Cefazolin] Hives   Penicillins Hives and Swelling    Has patient had a PCN reaction causing immediate rash, facial/tongue/throat swelling, SOB or lightheadedness with hypotension: Yes Has patient had a PCN reaction causing severe rash involving mucus membranes or skin necrosis: No Has patient had a PCN reaction that required hospitalization: No Has patient had a PCN reaction occurring within the last 10 years: Yes If all of the above answers are "NO", then may proceed with Cephalosporin use.   Adhesive [Tape] Other (See Comments)    SKIN IRRITATION   Keflex [Cephalexin] Rash    Review of Systems NEGATIVE UNLESS OTHERWISE INDICATED IN HPI      Objective:     BP (!) 132/94    Pulse 71    Temp 98 F (36.7 C)    Ht 5\' 3"  (1.6 m)    Wt 212 lb 6.1 oz (96.3 kg)    LMP 11/04/2018 (Within Weeks)    SpO2 98%    BMI 37.62 kg/m   Wt Readings from Last 3 Encounters:  01/05/22 212 lb 6.1 oz (96.3 kg)  07/05/21 208 lb 3.2 oz (94.4 kg)  06/27/21 210 lb (95.3 kg)    BP Readings from Last 3 Encounters:  01/05/22 (!) 132/94  07/05/21 137/81  06/27/21 132/74     Physical Exam Vitals and nursing note reviewed.  Constitutional:      Appearance: Normal appearance. She is obese. She is not toxic-appearing.  HENT:     Head: Normocephalic and atraumatic.     Right Ear: Tympanic membrane, ear canal and external ear normal.     Left Ear: Tympanic membrane, ear canal and external ear normal.     Nose: Nose normal.     Mouth/Throat:     Mouth: Mucous membranes are moist.  Eyes:     Extraocular Movements: Extraocular movements intact.     Conjunctiva/sclera: Conjunctivae normal.     Pupils: Pupils are equal, round, and reactive to light.  Cardiovascular:     Rate and Rhythm: Normal rate and  regular rhythm.     Pulses: Normal pulses.     Heart sounds: Normal heart sounds.  Pulmonary:     Effort: Pulmonary effort is normal.     Breath sounds: Normal breath sounds.  Abdominal:     General: Abdomen is flat. Bowel sounds are normal.     Palpations: Abdomen is soft.  Musculoskeletal:        General:  Normal range of motion.     Cervical back: Normal range of motion and neck supple.  Skin:    General: Skin is warm and dry.     Comments: Dry patches along hands and fingers   Neurological:     General: No focal deficit present.     Mental Status: She is alert and oriented to person, place, and time.  Psychiatric:        Mood and Affect: Mood normal.        Behavior: Behavior normal.        Thought Content: Thought content normal.        Judgment: Judgment normal.       Assessment & Plan:   Problem List Items Addressed This Visit       Other   Pre-diabetes   Relevant Orders   Hemoglobin A1c (Completed)   Hypercholesteremia   Relevant Orders   Comprehensive metabolic panel (Completed)   Other Visit Diagnoses     Encounter for annual physical exam    -  Primary   Relevant Orders   CBC with Differential/Platelet (Completed)   Comprehensive metabolic panel (Completed)   Lipid panel (Completed)   Hemoglobin A1c (Completed)   TSH (Completed)   VITAMIN D 25 Hydroxy (Vit-D Deficiency, Fractures) (Completed)   B12 (Completed)   Paresthesias       Relevant Orders   VITAMIN D 25 Hydroxy (Vit-D Deficiency, Fractures) (Completed)   B12 (Completed)   Screening for colon cancer       Relevant Orders   Ambulatory referral to Gastroenterology       Plan: -Age-appropriate screening and counseling performed today. Will check labs and call with results. Preventive measures discussed and printed in AVS for patient.  -She will cont regular f/up with dermatology and GYN  -Referral for screening colonoscopy sent  - ?low b12 or vit D with complaint of tingling - will check  this our and treat accordingly   F/up in 6 months for fasting labs / recheck or prn    Alonzo Owczarzak M Geary Rufo, PA-C

## 2022-01-08 ENCOUNTER — Other Ambulatory Visit: Payer: Self-pay | Admitting: Physician Assistant

## 2022-01-08 ENCOUNTER — Other Ambulatory Visit (HOSPITAL_COMMUNITY): Payer: Self-pay

## 2022-01-08 ENCOUNTER — Encounter: Payer: Self-pay | Admitting: Physician Assistant

## 2022-01-08 NOTE — Patient Instructions (Signed)
Good to see you today! Please go to the lab for blood work and I will send results through [facility]. Referral has been sent to GI for screening colonoscopy.

## 2022-01-12 ENCOUNTER — Ambulatory Visit
Admission: RE | Admit: 2022-01-12 | Discharge: 2022-01-12 | Disposition: A | Discharge status: Home / Self Care | Payer: BC Managed Care – PPO | Source: Ambulatory Visit | Attending: Obstetrics and Gynecology | Admitting: Obstetrics and Gynecology

## 2022-01-12 ENCOUNTER — Other Ambulatory Visit: Payer: Self-pay

## 2022-01-12 DIAGNOSIS — Z1231 Encounter for screening mammogram for malignant neoplasm of breast: Secondary | ICD-10-CM

## 2022-01-30 ENCOUNTER — Other Ambulatory Visit: Payer: Self-pay

## 2022-01-30 ENCOUNTER — Encounter: Payer: Self-pay | Admitting: Physician Assistant

## 2022-01-30 ENCOUNTER — Ambulatory Visit (INDEPENDENT_AMBULATORY_CARE_PROVIDER_SITE_OTHER): Payer: BC Managed Care – PPO | Admitting: Physician Assistant

## 2022-01-30 DIAGNOSIS — F319 Bipolar disorder, unspecified: Secondary | ICD-10-CM

## 2022-01-30 DIAGNOSIS — F411 Generalized anxiety disorder: Secondary | ICD-10-CM

## 2022-01-30 NOTE — Progress Notes (Signed)
[facility] ? ?Patient ID: Margaret [name],  ?MRN: [id number] ? ?PCP: Allwardt, Randa Evens, PA-C ? ?Date of Evaluation: 01/30/2022 ?Time spent:20 minutes ? ?Chief Complaint:  ?Chief Complaint   ?Depression; Follow-up; Anxiety ?  ? ? ?HISTORY/CURRENT STATUS: ?For routine med check. ? ?Last summer she wanted to go off mood stabilizers.  She weaned off of them gradually and has done well.  She does get irritable occasionally but feels like the situation warrants the irritability.  It is not such a problem that she feels like it is necessary to start them back up. ? ?She has been under a lot of stress in the past 6 months, her son had a perforated appendix, was septic, threw a clot to his portal vein, was hospitalized for 4 days and was very sick but is okay now.  Then her 80 year old [place] dog was attacked by a neighbor's dog and killed.  So it has been stressful but even through all of that she has not felt the need for mood stabilizers. ? ?Patient denies loss of interest in usual activities and is able to enjoy things.  Denies decreased energy or motivation.  Appetite has not changed.  No extreme sadness, tearfulness, or feelings of hopelessness.  Denies any changes in concentration, making decisions or remembering things.  Does not sleep well sometimes but does not feel like any med changes need to be made.  She does get anxious mostly about finances, would like to pay down some debt, but she is choosing to buy a boat so understands the dilemma.  Denies suicidal or homicidal thoughts. ? ?Patient denies increased energy with decreased need for sleep, no increased talkativeness, no racing thoughts, no impulsivity or risky behaviors, no increased spending, no increased libido, no grandiosity, no increased irritability or anger, no paranoia, and no hallucinations. ? ?Denies dizziness, syncope, seizures, numbness, tingling, tremor, tics, unsteady gait, slurred speech, confusion. Denies muscle or joint pain,  stiffness, or dystonia. ? ?Individual Medical History/ Review of Systems: Changes? :No   ? ?Past medications for mental health diagnoses include: ?Lexapro caused sexual side effects, Depakote, Effexor, Zoloft, Prozac, Trileptal, Lamictal, Wellbutrin ? ?Allergies: Dilaudid [hydromorphone], Amoxicillin, Ancef [cefazolin], Penicillins, Adhesive [tape], and Keflex [cephalexin] ? ?Current Medications:  ?Current Outpatient Medications:  ?  augmented betamethasone dipropionate (DIPROLENE-AF) 0.05 % ointment, Apply to affected area on the skin 2 times a day as needed for inflammation, Disp: 50 g, Rfl: 0 ?  B Complex Vitamins (B-COMPLEX/B-12 PO), Take by mouth., Disp: , Rfl:  ?  buPROPion (WELLBUTRIN XL) 300 MG 24 hr tablet, TAKE 1 TABLET BY MOUTH ONCE DAILY, Disp: 90 tablet, Rfl: 1 ?  cholecalciferol (VITAMIN D3) 25 MCG (1000 UNIT) tablet, Take 1,000 Units by mouth daily., Disp: , Rfl:  ?  docusate sodium (COLACE) 100 MG capsule, Take 100 mg by mouth 2 (two) times daily., Disp: , Rfl:  ?  ferrous sulfate 324 (65 Fe) MG TBEC, Take by mouth., Disp: , Rfl:  ?  ibuprofen (ADVIL,MOTRIN) 200 MG tablet, Take 600 mg by mouth every 6 (six) hours as needed for headache or mild pain., Disp: , Rfl:  ?  oxybutynin (DITROPAN-XL) 10 MG 24 hr tablet, TAKE 1 TABLET AT BEDTIME, Disp: 90 tablet, Rfl: 3 ?  valACYclovir (VALTREX) 500 MG tablet, TAKE 1 TABLET (500 MG TOTAL) BY MOUTH 2 (TWO) TIMES DAILY. FOR 3 DAYS, Disp: 30 tablet, Rfl: 1 ?  albuterol (PROVENTIL HFA;VENTOLIN HFA) 108 (90 BASE) MCG/ACT inhaler, Inhale 2 puffs into the  lungs every 6 (six) hours as needed for wheezing or shortness of breath. (Patient not taking: Reported on 01/30/2022), Disp: , Rfl:  ?  sennosides-docusate sodium (SENOKOT-S) 8.6-50 MG tablet, Take 1 tablet by mouth 2 (two) times daily. (Patient not taking: Reported on 01/30/2022), Disp: , Rfl:  ?Medication Side Effects: none ? ?Family Medical/ Social History: Changes?  ? ?MENTAL HEALTH EXAM: ? ?Last menstrual period  11/04/2018.There is no height or weight on file to calculate BMI.  ?General Appearance: Casual, Neat, Well Groomed and Obese  ?Eye Contact:  Good  ?Speech:  Clear and Coherent and Normal Rate  ?Volume:  Normal  ?Mood:  Euthymic  ?Affect:  Appropriate  ?Thought Process:  Goal Directed and Descriptions of Associations: Circumstantial  ?Orientation:  Full (Time, Place, and Person)  ?Thought Content: Logical   ?Suicidal Thoughts:  No  ?Homicidal Thoughts:  No  ?Memory:  WNL  ?Judgement:  Good  ?Insight:  Good  ?Psychomotor Activity:  Normal  ?Concentration:  Concentration: Good  ?Recall:  Good  ?Fund of Knowledge: Good  ?Language: Good  ?Assets:  Desire for Improvement  ?ADL's:  Intact  ?Cognition: WNL  ?Prognosis:  Good  ? ? ?DIAGNOSES:  ?  ICD-10-CM   ?1. Bipolar I disorder ([place])  F31.9   ?  ?2. Generalized anxiety disorder  F41.1   ?  ? ? ?Receiving Psychotherapy: No  ? ? ?RECOMMENDATIONS:  ?PDMP reviewed.  No results available. ?I provided 20 minutes of face to face time during this encounter, including time spent before and after the visit in records review, medical decision making, counseling pertinent to today's visit, and charting.  ?If she starts becoming more irritable without reason, or the irritability does not improve quickly, I do recommend going back on a mood stabilizer.  With her diagnosis of bipolar disorder it is warranted, she does not want to at this time. ?She has thought about seeing if her PCP will take over prescribing the Wellbutrin.  She would like for [place] to see each other at least 1 more time before making that change though.  She asks about going a year between visits, we have never done that but I will extend the visit to 8 to 9 months as long as she is doing well on the Wellbutrin alone. ?Continue Wellbutrin XL 300 mg daily. ?Return in 8-9 months. ? ?Donnal Moat, PA-C  ?

## 2022-02-23 ENCOUNTER — Other Ambulatory Visit: Payer: Self-pay

## 2022-02-23 ENCOUNTER — Encounter: Payer: Self-pay | Admitting: Plastic Surgery

## 2022-02-23 ENCOUNTER — Ambulatory Visit (INDEPENDENT_AMBULATORY_CARE_PROVIDER_SITE_OTHER): Payer: BC Managed Care – PPO | Admitting: Plastic Surgery

## 2022-02-23 VITALS — BP 144/92 | HR 96 | Ht 63.0 in | Wt 219.0 lb

## 2022-02-23 DIAGNOSIS — F319 Bipolar disorder, unspecified: Secondary | ICD-10-CM | POA: Diagnosis not present

## 2022-02-23 DIAGNOSIS — R7303 Prediabetes: Secondary | ICD-10-CM

## 2022-02-23 DIAGNOSIS — G8929 Other chronic pain: Secondary | ICD-10-CM

## 2022-02-23 DIAGNOSIS — M545 Low back pain, unspecified: Secondary | ICD-10-CM | POA: Diagnosis not present

## 2022-02-23 DIAGNOSIS — M793 Panniculitis, unspecified: Secondary | ICD-10-CM | POA: Diagnosis not present

## 2022-02-23 NOTE — Progress Notes (Signed)
? ?  Patient ID: Margaret [name], female    DOB: February 06, 1976, 46 y.o.   MRN: [id number] ? ? ?Chief Complaint  ?Patient presents with  ? Consult  ? ? ?The patient is a 46 year old female here for for evaluation of her abdomen.  She is 5 feet 3 inches tall and weighs 219 pounds.  She has 3 kids.  She complains of back pain and rashes.  She has been dealing with this for couple of years.  She uses water to try and alleviate the rash usually takes several days to heal.  She is not a smoker and has pre-DM and diabetes with a recent hemoglobin A1c of 6.3.  Her past surgical history is C-section and hysterectomy.  She has not had physical therapy.  She works for [employer].  She does have a hanging pannus which does appear to create excess moisture in her folds around the mons. ? ? ?Review of Systems  ?Constitutional:  Positive for activity change. Negative for appetite change.  ?Eyes: Negative.   ?Respiratory: Negative.  Negative for chest tightness and shortness of breath.   ?Cardiovascular: Negative.  Negative for leg swelling.  ?Gastrointestinal: Negative.   ?Endocrine: Negative.   ?Genitourinary: Negative.   ?Musculoskeletal:  Positive for back pain.  ?Skin:  Positive for color change and rash.  ?Neurological: Negative.   ?Hematological: Negative.   ?Psychiatric/Behavioral: Negative.    ? ?Past Medical History:  ?Diagnosis Date  ? Abrasion of right thumb 04/19/2015  ? Anxiety   ? Carpal tunnel syndrome on both sides 03/2015  ? Complication of anesthesia   ? Dental crown present   ? Diabetes mellitus without complication ([place])   ? Exercise-induced asthma   ? prn inhaler  seasonal  ? Headache associated with hormonal factors   ? Heart murmur   ? slight mummur  ? History of endometrial ablation   ? History of partial hysterectomy 2019  ? Hyperlipidemia   ? Low serum iron   ? no current med.  ? Obesity   ? PONV (postoperative nausea and vomiting)   ? nausea only  ? Pre-diabetes   ? PVCs (premature ventricular contractions)   ?  states are benign  ? Seasonal allergies   ? Trigger finger   ?  ?Past Surgical History:  ?Procedure Laterality Date  ? ABDOMINAL HYSTERECTOMY    ? Laparoscopic assisted Dr. Arvella Nigh 09-22-18  ? BREAST REDUCTION SURGERY Bilateral 10/27/2013  ? Procedure: MAMMARY REDUCTION  (BREAST) BILATERAL;  Surgeon: Erline Hau, MD;  Location: [facility];  Service: Plastics;  Laterality: Bilateral;  ? CARPAL TUNNEL RELEASE Bilateral 04/22/2015  ? Procedure: BILATERAL CARPAL TUNNEL RELEASE;  Surgeon: Roseanne Kaufman, MD;  Location: [facility];  Service: Orthopedics;  Laterality: Bilateral;  ? CESAREAN SECTION  04/23/2002  ? twins  ? CESAREAN SECTION  03/28/2005  ? DILATION AND CURETTAGE OF UTERUS    ? LAPAROSCOPIC VAGINAL HYSTERECTOMY WITH SALPINGECTOMY Bilateral 09/22/2018  ? Procedure: ABDOMINAL HYSTERECTOMY WITH BILATERAL SALPINGECTOMY;  Surgeon: Arvella Nigh, MD;  Location: [facility];  Service: Gynecology;  Laterality: Bilateral;  need bedL;  CONVERTED TO OPEN ABDOMINAL HYSTERECTOMY AT 0840  ? LASIK    ? REDUCTION MAMMAPLASTY Bilateral 2014  ? TRIGGER FINGER RELEASE    ? 2019  ? TRIGGER FINGER RELEASE Right 2020  ? R middle finger  ? uterine ablation    ? 02-03-2018  ?  ? ? ?Current Outpatient Medications:  ?  albuterol (PROVENTIL HFA;VENTOLIN  HFA) 108 (90 BASE) MCG/ACT inhaler, Inhale 2 puffs into the lungs every 6 (six) hours as needed for wheezing or shortness of breath., Disp: , Rfl:  ?  augmented betamethasone dipropionate (DIPROLENE-AF) 0.05 % ointment, Apply to affected area on the skin 2 times a day as needed for inflammation, Disp: 50 g, Rfl: 0 ?  B Complex Vitamins (B-COMPLEX/B-12 PO), Take by mouth., Disp: , Rfl:  ?  buPROPion (WELLBUTRIN XL) 300 MG 24 hr tablet, TAKE 1 TABLET BY MOUTH ONCE DAILY, Disp: 90 tablet, Rfl: 1 ?  cholecalciferol (VITAMIN D3) 25 MCG (1000 UNIT) tablet, Take 1,000 Units by mouth daily., Disp: , Rfl:  ?  docusate sodium (COLACE) 100 MG capsule,  Take 100 mg by mouth 2 (two) times daily., Disp: , Rfl:  ?  ferrous sulfate 324 (65 Fe) MG TBEC, Take by mouth., Disp: , Rfl:  ?  ibuprofen (ADVIL,MOTRIN) 200 MG tablet, Take 600 mg by mouth every 6 (six) hours as needed for headache or mild pain., Disp: , Rfl:  ?  oxybutynin (DITROPAN-XL) 10 MG 24 hr tablet, TAKE 1 TABLET AT BEDTIME, Disp: 90 tablet, Rfl: 3 ?  sennosides-docusate sodium (SENOKOT-S) 8.6-50 MG tablet, Take 1 tablet by mouth 2 (two) times daily., Disp: , Rfl:  ?  valACYclovir (VALTREX) 500 MG tablet, TAKE 1 TABLET (500 MG TOTAL) BY MOUTH 2 (TWO) TIMES DAILY. FOR 3 DAYS, Disp: 30 tablet, Rfl: 1  ? ?Objective:  ? ?Vitals:  ? 02/23/22 1236  ?BP: (!) 144/92  ?Pulse: 96  ?SpO2: 98%  ? ? ?Physical Exam ?Vitals and nursing note reviewed.  ?Constitutional:   ?   Appearance: Normal appearance.  ?HENT:  ?   Head: Normocephalic and atraumatic.  ?Cardiovascular:  ?   Rate and Rhythm: Normal rate.  ?   Pulses: Normal pulses.  ?Pulmonary:  ?   Effort: Pulmonary effort is normal.  ?Abdominal:  ?   General: There is no distension.  ?   Palpations: Abdomen is soft.  ?Musculoskeletal:     ?   General: No swelling or deformity.  ?Skin: ?   General: Skin is warm.  ?   Coloration: Skin is not jaundiced.  ?   Findings: No bruising.  ?Neurological:  ?   Mental Status: She is alert and oriented to person, place, and time.  ?Psychiatric:     ?   Mood and Affect: Mood normal.     ?   Behavior: Behavior normal.     ?   Thought Content: Thought content normal.     ?   Judgment: Judgment normal.  ? ? ?Assessment & Plan:  ?Bipolar 1 disorder ([place]) ? ?Chronic bilateral low back pain, unspecified whether sciatica present ? ?Pre-diabetes ? ?Panniculitis ? ?The procedure the patient selected and that was best for the patient was discussed. The risk were discussed and include but not limited to the following:  fluid accumulation, skin loss, fat necrosis, bleeding, infection and healing delay.  There are risks of anesthesia and injury  to nerves or blood vessels.  Allergic reaction to tape, suture and skin glue are possible.  There will be swelling.  Any of these can lead to the need for revisional surgery.  ?   ?Physical therapy:  ordered ?Healthy Weight and Wellness:  ordered ? ?Pictures were obtained of the patient and placed in the chart with the patient's or guardian's permission. ? ?Patient is a good candidate for panniculectomy with possible liposuction.  She is committed to  weight loss and physical therapy.  She is going to follow-up with [place] in 6 months.  In the meantime I encouraged her to take pictures of the rashes and to talk with her PCP. ? ? ?Loel Lofty Hatem Cull, DO ?

## 2022-03-21 ENCOUNTER — Other Ambulatory Visit: Payer: Self-pay | Admitting: Physician Assistant

## 2022-03-27 ENCOUNTER — Encounter: Payer: Self-pay | Admitting: Plastic Surgery

## 2022-04-09 ENCOUNTER — Telehealth: Payer: Self-pay

## 2022-04-09 NOTE — Telephone Encounter (Signed)
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  States she would like for Allwardt to send in an antibiotic based off of her testing.  I have advised patient that provider is out of the office.  I have offered patient to schedule appointment with another provider and test.    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  I informed her that we would still need to schedule appointment.  Patient declined appointment, stating she would treat herself at home.

## 2022-04-10 NOTE — Telephone Encounter (Signed)
FYI

## 2022-04-11 NOTE — Telephone Encounter (Signed)
Noted and agreed, thank you. 

## 2022-04-13 ENCOUNTER — Ambulatory Visit (INDEPENDENT_AMBULATORY_CARE_PROVIDER_SITE_OTHER): Payer: BC Managed Care – PPO | Admitting: Family Medicine

## 2022-04-13 ENCOUNTER — Other Ambulatory Visit (HOSPITAL_COMMUNITY): Payer: Self-pay

## 2022-04-13 ENCOUNTER — Encounter: Payer: Self-pay | Admitting: Family Medicine

## 2022-04-13 VITALS — BP 156/88 | HR 83 | Temp 99.0°F | Ht 63.0 in | Wt 218.6 lb

## 2022-04-13 DIAGNOSIS — R300 Dysuria: Secondary | ICD-10-CM | POA: Diagnosis not present

## 2022-04-13 LAB — POCT URINALYSIS DIPSTICK
Bilirubin, UA: NEGATIVE
Blood, UA: POSITIVE
Glucose, UA: NEGATIVE
Ketones, UA: NEGATIVE
Leukocytes, UA: NEGATIVE
Nitrite, UA: NEGATIVE
Protein, UA: NEGATIVE
Spec Grav, UA: 1.02 (ref 1.010–1.025)
Urobilinogen, UA: 0.2 E.U./dL
pH, UA: 5.5 (ref 5.0–8.0)

## 2022-04-13 MED ORDER — NITROFURANTOIN MONOHYD MACRO 100 MG PO CAPS
100.0000 mg | ORAL_CAPSULE | Freq: Two times a day (BID) | ORAL | 0 refills | Status: DC
Start: 2022-04-13 — End: 2022-06-25
  Filled 2022-04-13: qty 14, 7d supply, fill #0

## 2022-04-13 NOTE — Patient Instructions (Signed)
It was nice to see you!  You have a urinary tract infection. Please start the antibiotic.  We will check a urine culture to make sure you do not have a resistant bacteria. We will call you if we need to change your medications.   Please make sure you are drinking plenty of fluids over the next few days.  If your symptoms do not improve over the next 5-7 days, or if they worsen, please let us know. Please also let us know if you have worsening back pain, fevers, chills, or body aches.   Take care, Dr Jerline Pain

## 2022-04-13 NOTE — Progress Notes (Signed)
   Margaret [name] is a 46 y.o. female who presents today for an office visit.  Assessment/Plan:  UTI History consistent with UTI.  Her point-of-care UA today shows positive blood.  Leukocyte and nitrates negative.  No red flags or signs of systemic illness.  We will check urine culture.  Empirically start Macrobid 100 mg twice daily x7 days while we await culture results.  Encouraged hydration.  We discussed reasons to return to care.  Follow-up as needed.    Subjective:  HPI:  Patient here with concern for UTI. Symptoms include burning and frequency.  Started 4 days ago.  Tried using cranberry and increase fluid however symptoms have not been improving.  She had chills initially but none since.  No fevers.  Feels similar to previous UTIs.       Objective:  Physical Exam: BP (!) 156/88 (BP Location: Right Arm) Comment: manual  Pulse 83   Temp 99 F (37.2 C) (Temporal)   Ht '5\' 3"'$  (1.6 m)   Wt 218 lb 9.6 oz (99.2 kg)   LMP 11/04/2018 (Within Weeks)   SpO2 100%   BMI 38.72 kg/m   Gen: No acute distress, resting comfortably Neuro: Grossly normal, moves all extremities Psych: Normal affect and thought content      Virgal Warmuth M. Jerline Pain, MD 04/13/2022 3:35 PM

## 2022-04-15 LAB — URINE CULTURE
MICRO NUMBER:: 13420560
SPECIMEN QUALITY:: ADEQUATE

## 2022-04-16 NOTE — Progress Notes (Signed)
Please inform patient of the following:  Her urine culture confirms UTI.  The antibiotic we have her on should treat this.  Would like for her to finish her antibiotics and let us know if not improving.  Algis Greenhouse. Jerline Pain, MD 04/16/2022 12:04 PM

## 2022-06-05 DIAGNOSIS — Z0289 Encounter for other administrative examinations: Secondary | ICD-10-CM

## 2022-06-14 ENCOUNTER — Encounter (INDEPENDENT_AMBULATORY_CARE_PROVIDER_SITE_OTHER): Payer: Self-pay | Admitting: Bariatrics

## 2022-06-14 ENCOUNTER — Ambulatory Visit (INDEPENDENT_AMBULATORY_CARE_PROVIDER_SITE_OTHER): Payer: BC Managed Care – PPO | Admitting: Bariatrics

## 2022-06-14 VITALS — BP 156/98 | HR 68 | Temp 97.8°F | Ht 63.0 in | Wt 217.0 lb

## 2022-06-14 DIAGNOSIS — R7303 Prediabetes: Secondary | ICD-10-CM

## 2022-06-14 DIAGNOSIS — E65 Localized adiposity: Secondary | ICD-10-CM | POA: Insufficient documentation

## 2022-06-14 DIAGNOSIS — R5383 Other fatigue: Secondary | ICD-10-CM

## 2022-06-14 DIAGNOSIS — E7800 Pure hypercholesterolemia, unspecified: Secondary | ICD-10-CM

## 2022-06-14 DIAGNOSIS — E559 Vitamin D deficiency, unspecified: Secondary | ICD-10-CM

## 2022-06-14 DIAGNOSIS — E538 Deficiency of other specified B group vitamins: Secondary | ICD-10-CM

## 2022-06-14 DIAGNOSIS — R0602 Shortness of breath: Secondary | ICD-10-CM | POA: Diagnosis not present

## 2022-06-14 DIAGNOSIS — Z0000 Encounter for general adult medical examination without abnormal findings: Secondary | ICD-10-CM

## 2022-06-14 DIAGNOSIS — Z6838 Body mass index (BMI) 38.0-38.9, adult: Secondary | ICD-10-CM | POA: Insufficient documentation

## 2022-06-14 DIAGNOSIS — Z1331 Encounter for screening for depression: Secondary | ICD-10-CM

## 2022-06-15 LAB — COMPREHENSIVE METABOLIC PANEL
ALT: 17 IU/L (ref 0–32)
AST: 15 IU/L (ref 0–40)
Albumin/Globulin Ratio: 2 (ref 1.2–2.2)
Albumin: 4.5 g/dL (ref 3.9–4.9)
Alkaline Phosphatase: 53 IU/L (ref 44–121)
BUN/Creatinine Ratio: 17 (ref 9–23)
BUN: 13 mg/dL (ref 6–24)
Bilirubin Total: 0.3 mg/dL (ref 0.0–1.2)
CO2: 21 mmol/L (ref 20–29)
Calcium: 9.1 mg/dL (ref 8.7–10.2)
Chloride: 102 mmol/L (ref 96–106)
Creatinine, Ser: 0.75 mg/dL (ref 0.57–1.00)
Globulin, Total: 2.2 g/dL (ref 1.5–4.5)
Glucose: 106 mg/dL — ABNORMAL HIGH (ref 70–99)
Potassium: 4.7 mmol/L (ref 3.5–5.2)
Sodium: 139 mmol/L (ref 134–144)
Total Protein: 6.7 g/dL (ref 6.0–8.5)
eGFR: 98 mL/min/{1.73_m2} (ref >59)

## 2022-06-15 LAB — VITAMIN D 25 HYDROXY (VIT D DEFICIENCY, FRACTURES): Vit D, 25-Hydroxy: 32.2 ng/mL (ref 30.0–100.0)

## 2022-06-15 LAB — LIPID PANEL WITH LDL/HDL RATIO
Cholesterol, Total: 204 mg/dL — ABNORMAL HIGH (ref 100–199)
HDL: 52 mg/dL (ref >39)
LDL Chol Calc (NIH): 130 mg/dL — ABNORMAL HIGH (ref 0–99)
LDL/HDL Ratio: 2.5 ratio (ref 0.0–3.2)
Triglycerides: 122 mg/dL (ref 0–149)
VLDL Cholesterol Cal: 22 mg/dL (ref 5–40)

## 2022-06-15 LAB — VITAMIN B12: Vitamin B-12: 411 pg/mL (ref 232–1245)

## 2022-06-15 LAB — INSULIN, RANDOM: INSULIN: 17.6 u[IU]/mL (ref 2.6–24.9)

## 2022-06-15 LAB — TSH+T4F+T3FREE
Free T4: 1.27 ng/dL (ref 0.82–1.77)
T3, Free: 2.8 pg/mL (ref 2.0–4.4)
TSH: 1.39 u[IU]/mL (ref 0.450–4.500)

## 2022-06-15 LAB — HEMOGLOBIN A1C
Est. average glucose Bld gHb Est-mCnc: 128 mg/dL
Hgb A1c MFr Bld: 6.1 % — ABNORMAL HIGH (ref 4.8–5.6)

## 2022-06-20 NOTE — Progress Notes (Signed)
Chief Complaint:   OBESITY Margaret [name] (MR# [id number]) is a 46 y.o. female who presents for evaluation and treatment of obesity and related comorbidities. Current BMI is Body mass index is 38.44 kg/m. Margaret [name] has been struggling with her weight for many years and has been unsuccessful in either losing weight, maintaining weight loss, or reaching her healthy weight goal.  Margaret [name] would like to lose at least 20 or more pounds.  Margaret [name] is currently in the action stage of change and ready to dedicate time achieving and maintaining a healthier weight. Margaret [name] is interested in becoming our patient and working on intensive lifestyle modifications including (but not limited to) diet and exercise for weight loss.  Margaret [name]'s habits were reviewed today and are as follows: Her family eats meals together, she thinks her family will eat healthier with her, her desired weight loss is 57 lbs, she has been heavy most of her life, she started gaining weight in college, her heaviest weight ever was 226 pounds, she has significant food cravings issues, she snacks frequently in the evenings, she skips meals frequently, she is frequently drinking liquids with calories, she frequently makes poor food choices, she frequently eats larger portions than normal, and she struggles with emotional eating.  Depression Screen Margaret [name]'s Food and Mood (modified PHQ-9) score was 4.     06/14/2022    8:25 AM  Depression screen PHQ 2/9  Decreased Interest 1  Down, Depressed, Hopeless 1  PHQ - 2 Score 2  Altered sleeping 0  Tired, decreased energy 1  Change in appetite 1  Feeling bad or failure about yourself  0  Trouble concentrating 0  Moving slowly or fidgety/restless 0  Suicidal thoughts 0  PHQ-9 Score 4  Difficult doing work/chores Not difficult at all   Subjective:   1. Other fatigue Margaret [name] admits to daytime somnolence and admits to waking up still tired. Patient has a history of symptoms of daytime fatigue  and morning fatigue. Margaret [name] generally gets 6 hours of sleep per night, and states that she has generally restful sleep. Snoring is present. Apneic episodes are not present. Epworth Sleepiness Score is 6.   2. SOB (shortness of breath) on exertion Margaret [name] notes increasing shortness of breath with exercising and seems to be worsening over time with weight gain. She notes getting out of breath sooner with activity than she used to. This has not gotten worse recently. [name] denies shortness of breath at rest or orthopnea.  3. Vitamin D deficiency Margaret [name] is taking vitamin D.  Last vitamin D level was 11.98, low.  4. Vitamin B 12 deficiency Margaret [name] is taking B12 currently.  Last B12 level was 203, low.  5. Pre-diabetes Margaret [name] is not on medications currently.  Last A1c was 6.3.  6. Hypercholesteremia Margaret [name] is not on medications currently.  7. Health care maintenance Obesity with comorbidity.  8. Abdominal pannus Margaret [name] has seen a Psychiatric nurse.  She was told to work on her weight and physical therapy.  Assessment/Plan:   1. Other fatigue [name] does feel that her weight is causing her energy to be lower than it should be. Fatigue may be related to obesity, depression or many other causes. Labs will be ordered, and in the meanwhile, [name] will focus on self care including making healthy food choices, increasing physical activity and focusing on stress reduction.  - EKG 12-Lead - TSH+T4F+T3Free  2. SOB (shortness of breath) on exertion [name] does feel that she gets out of breath  more easily that she used to when she exercises. [name]'s shortness of breath appears to be obesity related and exercise induced. She has agreed to work on weight loss and gradually increase exercise to treat her exercise induced shortness of breath. Will continue to monitor closely.  - TSH+T4F+T3Free  3. Vitamin D deficiency We will check labs today, and Margaret [name] will continue her vitamin D  supplementation.  - VITAMIN D 25 Hydroxy (Vit-D Deficiency, Fractures)  4. Vitamin B 12 deficiency We will check labs today, and Margaret [name] will continue her B12 supplementation.  - Vitamin B12  5. Pre-diabetes We will check labs today, and we will follow-up at Margaret [name] next office visit.  - Comprehensive metabolic panel - Hemoglobin A1c - Insulin, random  6. Hypercholesteremia We will check labs today.  Margaret [name] will keep saturated fats low and eliminate trans fats.  We will follow-up at her next office visit.  - Comprehensive metabolic panel - Lipid Panel With LDL/HDL Ratio  7. Health care maintenance We will check labs today, and we will follow-up at Margaret [name] next office visit.  8. Abdominal pannus Margaret [name] will work on her weight loss efforts.  9. Depression screening Margaret [name] had a negative depression screening. Depression is commonly associated with obesity and often results in emotional eating behaviors. We will monitor this closely and work on CBT to help improve the non-hunger eating patterns. Referral to Psychology may be required if no improvement is seen as she continues in our clinic.  10. Class 2 severe obesity with serious comorbidity and body mass index (BMI) of 38.0 to 38.9 in adult, unspecified obesity type Margaret [name]) Margaret [name] is currently in the action stage of change and her goal is to continue with weight loss efforts. I recommend Margaret [name] begin the structured treatment plan as follows:  She has agreed to the Category 2 Plan and keeping a food journal and adhering to recommended goals of 1200 calories and 80 grams of protein.  Meal planning was discussed.  Review labs with the patient from 01/05/2022, CMP, lipids, vitamin D, B12, CBC, A1c, and thyroid panel.  Exercise goals: No exercise has been prescribed at this time.   Behavioral modification strategies: increasing lean protein intake, decreasing simple carbohydrates, increasing vegetables, increasing water intake,  decreasing eating out, no skipping meals, meal planning and cooking strategies, keeping healthy foods in the home, and planning for success.  She was informed of the importance of frequent follow-up visits to maximize her success with intensive lifestyle modifications for her multiple health conditions. She was informed we would discuss her lab results at her next visit unless there is a critical issue that needs to be addressed sooner. Margaret [name] agreed to keep her next visit at the agreed upon time to discuss these results.  Objective:   Blood pressure (!) 156/98, pulse 68, temperature 97.8 F (36.6 C), height '5\' 3"'$  (1.6 m), weight 217 lb (98.4 kg), last menstrual period 09/14/2018, SpO2 97 %. Body mass index is 38.44 kg/m.  EKG: Normal sinus rhythm, rate 82 BPM.  Indirect Calorimeter completed today shows a VO2 of 224 and a REE of 1541.  Her calculated basal metabolic rate is 1478 thus her basal metabolic rate is worse than expected.  General: Cooperative, alert, well developed, in no acute distress. HEENT: Conjunctivae and lids unremarkable. Cardiovascular: Regular rhythm.  Lungs: Normal work of breathing. Neurologic: No focal deficits.   Lab Results  Component Value Date   CREATININE 0.75 06/14/2022   BUN 13 06/14/2022   NA 139 06/14/2022  K 4.7 06/14/2022   CL 102 06/14/2022   CO2 21 06/14/2022   Lab Results  Component Value Date   ALT 17 06/14/2022   AST 15 06/14/2022   ALKPHOS 53 06/14/2022   BILITOT 0.3 06/14/2022   Lab Results  Component Value Date   HGBA1C 6.1 (H) 06/14/2022   HGBA1C 6.3 01/05/2022   HGBA1C 6.4 07/05/2021   HGBA1C 6.0 12/01/2020   HGBA1C 5.6 05/13/2020   Lab Results  Component Value Date   INSULIN 17.6 06/14/2022   Lab Results  Component Value Date   TSH 1.390 06/14/2022   Lab Results  Component Value Date   CHOL 204 (H) 06/14/2022   HDL 52 06/14/2022   LDLCALC 130 (H) 06/14/2022   TRIG 122 06/14/2022   CHOLHDL 4 01/05/2022   Lab  Results  Component Value Date   WBC 7.0 01/05/2022   HGB 11.8 (L) 01/05/2022   HCT 37.7 01/05/2022   MCV 76.7 (L) 01/05/2022   PLT 302.0 01/05/2022   Lab Results  Component Value Date   IRON 36 (L) 10/13/2014   TIBC 430 10/13/2014   FERRITIN 10 10/13/2014   Attestation Statements:   Reviewed by clinician on day of visit: allergies, medications, problem list, medical history, surgical history, family history, social history, and previous encounter notes.  Wilhemena Durie, am acting as Location manager for [employer], DO.  I have reviewed the above documentation for accuracy and completeness, and I agree with the above. Jearld Lesch, DO

## 2022-06-25 ENCOUNTER — Encounter: Payer: Self-pay | Admitting: Family

## 2022-06-25 ENCOUNTER — Other Ambulatory Visit (HOSPITAL_COMMUNITY): Payer: Self-pay

## 2022-06-25 ENCOUNTER — Ambulatory Visit (INDEPENDENT_AMBULATORY_CARE_PROVIDER_SITE_OTHER): Payer: BC Managed Care – PPO | Admitting: Family

## 2022-06-25 VITALS — BP 115/70 | HR 85 | Temp 98.7°F | Ht 63.0 in | Wt 215.2 lb

## 2022-06-25 DIAGNOSIS — N3090 Cystitis, unspecified without hematuria: Secondary | ICD-10-CM

## 2022-06-25 LAB — POCT URINALYSIS DIPSTICK
Bilirubin, UA: NEGATIVE
Blood, UA: NEGATIVE
Glucose, UA: NEGATIVE
Ketones, UA: POSITIVE
Nitrite, UA: NEGATIVE
Protein, UA: NEGATIVE
Spec Grav, UA: 1.015 (ref 1.010–1.025)
Urobilinogen, UA: 1 E.U./dL
pH, UA: 6 (ref 5.0–8.0)

## 2022-06-25 MED ORDER — NITROFURANTOIN MONOHYD MACRO 100 MG PO CAPS
100.0000 mg | ORAL_CAPSULE | Freq: Two times a day (BID) | ORAL | 0 refills | Status: DC
  Filled 2022-06-25: qty 14, 7d supply, fill #0

## 2022-06-25 NOTE — Progress Notes (Signed)
Patient ID: Margaret [name], female    DOB: 07-11-1976, 46 y.o.   MRN: [id number]  Chief Complaint  Patient presents with   Urinary Frequency    Pt c/o having a UTI on 5/19. Present for at least two weeks some dysuria and urine odor and urgency after urinating. Pt has tried AZO, cranberry juice which did help with discomfort.     HPI: Urinary symptoms: Patient c/o  dysuria, frequency, urgency, flank pain, pelvic pain, low back pain, foul odor, cloudy urine, hematuria. Denies:  . Other sx: vaginal d/c: none, vaginal itching: none. Duration of sx: 2 weeks ago; Home tx: AZO; Denies nausea, fever. Reports last UTI 2 mos ago.    Assessment & Plan:  1. Cystitis UA positive. pt reports last UTI 2 mos ago, treated with macrobid and sx resolved. Has only had 1 previous UTI ever. Denies any bowel problems/diarrhea, no new meds or recent abt. Advised on preventative measures, if happens again this year, she should discuss with her urologist.  - POCT Urinalysis Dipstick - Urine Culture - nitrofurantoin, macrocrystal-monohydrate, (MACROBID) 100 MG capsule; Take 1 capsule (100 mg total) by mouth 2 (two) times daily.  Dispense: 14 capsule; Refill: 0   Subjective:    Outpatient Medications Prior to Visit  Medication Sig Dispense Refill   albuterol (PROVENTIL HFA;VENTOLIN HFA) 108 (90 BASE) MCG/ACT inhaler Inhale 2 puffs into the lungs every 6 (six) hours as needed for wheezing or shortness of breath.     augmented betamethasone dipropionate (DIPROLENE-AF) 0.05 % ointment Apply to affected area on the skin 2 times a day as needed for inflammation 50 g 0   B Complex Vitamins (B-COMPLEX/B-12 PO) Take by mouth.     buPROPion (WELLBUTRIN XL) 300 MG 24 hr tablet TAKE 1 TABLET DAILY 90 tablet 1   cholecalciferol (VITAMIN D3) 25 MCG (1000 UNIT) tablet Take 1,000 Units by mouth daily.     docusate sodium (COLACE) 100 MG capsule Take 100 mg by mouth 2 (two) times daily.     ferrous sulfate 324 (65 Fe) MG TBEC  Take by mouth.     ibuprofen (ADVIL,MOTRIN) 200 MG tablet Take 600 mg by mouth every 6 (six) hours as needed for headache or mild pain.     oxybutynin (DITROPAN-XL) 10 MG 24 hr tablet TAKE 1 TABLET AT BEDTIME 90 tablet 3   sennosides-docusate sodium (SENOKOT-S) 8.6-50 MG tablet Take 1 tablet by mouth 2 (two) times daily.     valACYclovir (VALTREX) 500 MG tablet TAKE 1 TABLET (500 MG TOTAL) BY MOUTH 2 (TWO) TIMES DAILY. FOR 3 DAYS 30 tablet 1   nitrofurantoin, macrocrystal-monohydrate, (MACROBID) 100 MG capsule Take 1 capsule by mouth 2 times daily. (Patient not taking: Reported on 06/25/2022) 14 capsule 0   No facility-administered medications prior to visit.   Past Medical History:  Diagnosis Date   Abrasion of right thumb 04/19/2015   Anxiety    Asthma    Back pain    Carpal tunnel syndrome on both sides 25/0539   Complication of anesthesia    Dental crown present    Diabetes mellitus without complication (HCC)    Exercise-induced asthma    prn inhaler  seasonal   Fatigue    Headache associated with hormonal factors    Heart burn    Heart murmur    slight mummur   History of endometrial ablation    History of partial hysterectomy 2019   Hyperlipidemia    Infertility, female  Low serum iron    no current med.   Obesity    Palpitations    PONV (postoperative nausea and vomiting)    nausea only   Pre-diabetes    PVCs (premature ventricular contractions)    states are benign   Seasonal allergies    SOB (shortness of breath)    Trigger finger    Past Surgical History:  Procedure Laterality Date   ABDOMINAL HYSTERECTOMY     Laparoscopic assisted Dr. Arvella Nigh 09-22-18   BREAST REDUCTION SURGERY Bilateral 10/27/2013   Procedure: MAMMARY REDUCTION  (BREAST) BILATERAL;  Surgeon: Erline Hau, MD;  Location: [place];  Service: Plastics;  Laterality: Bilateral;   CARPAL TUNNEL RELEASE Bilateral 04/22/2015   Procedure: BILATERAL CARPAL TUNNEL RELEASE;   Surgeon: Roseanne Kaufman, MD;  Location: [place];  Service: Orthopedics;  Laterality: Bilateral;   CESAREAN SECTION  04/23/2002   twins   CESAREAN SECTION  03/28/2005   DILATION AND CURETTAGE OF UTERUS     LAPAROSCOPIC VAGINAL HYSTERECTOMY WITH SALPINGECTOMY Bilateral 09/22/2018   Procedure: ABDOMINAL HYSTERECTOMY WITH BILATERAL SALPINGECTOMY;  Surgeon: Arvella Nigh, MD;  Location: [place];  Service: Gynecology;  Laterality: Bilateral;  need bedL;  CONVERTED TO OPEN ABDOMINAL HYSTERECTOMY AT 0840   LASIK     REDUCTION MAMMAPLASTY Bilateral 2014   TRIGGER FINGER RELEASE     2019   TRIGGER FINGER RELEASE Right 2020   R middle finger   uterine ablation     02-03-2018   Allergies  Allergen Reactions   Dilaudid [Hydromorphone] Itching   Amoxicillin Hives and Swelling    Has patient had a PCN reaction causing immediate rash, facial/tongue/throat swelling, SOB or lightheadedness with hypotension: Yes Has patient had a PCN reaction causing severe rash involving mucus membranes or skin necrosis: No Has patient had a PCN reaction that required hospitalization: No Has patient had a PCN reaction occurring within the last 10 years: Yes If all of the above answers are "NO", then may proceed with Cephalosporin use.   Ancef [Cefazolin] Hives   Penicillins Hives and Swelling    Has patient had a PCN reaction causing immediate rash, facial/tongue/throat swelling, SOB or lightheadedness with hypotension: Yes Has patient had a PCN reaction causing severe rash involving mucus membranes or skin necrosis: No Has patient had a PCN reaction that required hospitalization: No Has patient had a PCN reaction occurring within the last 10 years: Yes If all of the above answers are "NO", then may proceed with Cephalosporin use.   Adhesive [Tape] Other (See Comments)    SKIN IRRITATION   Keflex [Cephalexin] Rash      Objective:    Physical Exam Vitals and nursing note reviewed.   Constitutional:      Appearance: Normal appearance. She is obese.  Cardiovascular:     Rate and Rhythm: Normal rate and regular rhythm.  Pulmonary:     Effort: Pulmonary effort is normal.     Breath sounds: Normal breath sounds.  Musculoskeletal:        General: Normal range of motion.  Skin:    General: Skin is warm and dry.  Neurological:     Mental Status: She is alert.  Psychiatric:        Mood and Affect: Mood normal.        Behavior: Behavior normal.    BP 115/70 (BP Location: Left Arm, Patient Position: Sitting, Cuff Size: Large)   Pulse 85   Temp 98.7 F (37.1  C) (Temporal)   Ht '5\' 3"'$  (1.6 m)   Wt 215 lb 4 oz (97.6 kg)   LMP 09/14/2018 (Exact Date)   SpO2 96%   BMI 38.13 kg/m  Wt Readings from Last 3 Encounters:  06/25/22 215 lb 4 oz (97.6 kg)  06/14/22 217 lb (98.4 kg)  04/13/22 218 lb 9.6 oz (99.2 kg)       Jeanie Sewer, NP

## 2022-06-27 ENCOUNTER — Encounter (INDEPENDENT_AMBULATORY_CARE_PROVIDER_SITE_OTHER): Payer: Self-pay | Admitting: Bariatrics

## 2022-06-28 ENCOUNTER — Encounter (INDEPENDENT_AMBULATORY_CARE_PROVIDER_SITE_OTHER): Payer: Self-pay | Admitting: Bariatrics

## 2022-06-28 ENCOUNTER — Ambulatory Visit (INDEPENDENT_AMBULATORY_CARE_PROVIDER_SITE_OTHER): Payer: BC Managed Care – PPO | Admitting: Bariatrics

## 2022-06-28 VITALS — BP 123/85 | HR 83 | Temp 98.1°F | Ht 63.0 in | Wt 211.0 lb

## 2022-06-28 DIAGNOSIS — R7303 Prediabetes: Secondary | ICD-10-CM

## 2022-06-28 DIAGNOSIS — E66812 Obesity, class 2: Secondary | ICD-10-CM

## 2022-06-28 DIAGNOSIS — E559 Vitamin D deficiency, unspecified: Secondary | ICD-10-CM | POA: Diagnosis not present

## 2022-06-28 DIAGNOSIS — E669 Obesity, unspecified: Secondary | ICD-10-CM | POA: Diagnosis not present

## 2022-06-28 DIAGNOSIS — Z6837 Body mass index (BMI) 37.0-37.9, adult: Secondary | ICD-10-CM | POA: Diagnosis not present

## 2022-07-04 ENCOUNTER — Encounter (INDEPENDENT_AMBULATORY_CARE_PROVIDER_SITE_OTHER): Payer: Self-pay

## 2022-07-06 ENCOUNTER — Ambulatory Visit: Payer: BC Managed Care – PPO | Admitting: Physician Assistant

## 2022-07-09 NOTE — Progress Notes (Unsigned)
Chief Complaint:   OBESITY Margaret [name] is here to discuss her progress with her obesity treatment plan along with follow-up of her obesity related diagnoses. [name] is on the Category 2 Plan and keeping a food journal and adhering to recommended goals of 1200 calories and 80 grams of protein daily and states she is following her eating plan approximately 100% of the time. [name] states she is walking and going to the gym for 40 minutes 3 times per week.  Today's visit was #: 2 Starting weight: 217 lbs Starting date: 06/14/2022 Today's weight: 211 lbs Today's date: 06/28/2022 Total lbs lost to date: 6 Total lbs lost since last in-office visit: 6  Interim History: [name] is down 6 pounds since her first visit.  Subjective:   1. Vitamin D insufficiency Margaret [name] is taking vitamin D OTC.  Her vitamin D level is 32.2.  2. Pre-diabetes Margaret [name] is not on medications currently.  Her A1c is 6.1 and insulin level is 17.6.  Assessment/Plan:   1. Vitamin D insufficiency Dacoda will continue taking OTC vitamin D and her other OTC supplementations.  2. Pre-diabetes Margaret [name] will keep all carbohydrates low (sugar and starches).  Handouts on insulin resistance and prediabetes were given to the patient today.  3. Obesity, current BMI 37.4 Margaret [name] is currently in the action stage of change. As such, her goal is to continue with weight loss efforts. She has agreed to the Category 2 Plan.   Meal planning and intentional eating were discussed.  Review labs with the patient from 06/14/2022, CMP, lipids, vitamin D, B12, insulin, A1c, and thyroid panel.  She will continue to journal. Recipes handouts were given.  Exercise goals: As is.   Behavioral modification strategies: increasing lean protein intake, decreasing simple carbohydrates, increasing vegetables, increasing water intake, decreasing eating out, no skipping meals, meal planning and cooking strategies, keeping healthy foods in the home, and planning  for success.  Margaret [name] has agreed to follow-up with our clinic in 2 to 3 weeks. She was informed of the importance of frequent follow-up visits to maximize her success with intensive lifestyle modifications for her multiple health conditions.   Objective:   Blood pressure 123/85, pulse 83, temperature 98.1 F (36.7 C), height '5\' 3"'$  (1.6 m), weight 211 lb (95.7 kg), last menstrual period 09/14/2018, SpO2 98 %. Body mass index is 37.38 kg/m.  General: Cooperative, alert, well developed, in no acute distress. HEENT: Conjunctivae and lids unremarkable. Cardiovascular: Regular rhythm.  Lungs: Normal work of breathing. Neurologic: No focal deficits.   Lab Results  Component Value Date   CREATININE 0.75 06/14/2022   BUN 13 06/14/2022   NA 139 06/14/2022   K 4.7 06/14/2022   CL 102 06/14/2022   CO2 21 06/14/2022   Lab Results  Component Value Date   ALT 17 06/14/2022   AST 15 06/14/2022   ALKPHOS 53 06/14/2022   BILITOT 0.3 06/14/2022   Lab Results  Component Value Date   HGBA1C 6.1 (H) 06/14/2022   HGBA1C 6.3 01/05/2022   HGBA1C 6.4 07/05/2021   HGBA1C 6.0 12/01/2020   HGBA1C 5.6 05/13/2020   Lab Results  Component Value Date   INSULIN 17.6 06/14/2022   Lab Results  Component Value Date   TSH 1.390 06/14/2022   Lab Results  Component Value Date   CHOL 204 (H) 06/14/2022   HDL 52 06/14/2022   LDLCALC 130 (H) 06/14/2022   TRIG 122 06/14/2022   CHOLHDL 4 01/05/2022   Lab Results  Component  Value Date   VD25OH 32.2 06/14/2022   VD25OH 11.98 (L) 01/05/2022   Lab Results  Component Value Date   WBC 7.0 01/05/2022   HGB 11.8 (L) 01/05/2022   HCT 37.7 01/05/2022   MCV 76.7 (L) 01/05/2022   PLT 302.0 01/05/2022   Lab Results  Component Value Date   IRON 36 (L) 10/13/2014   TIBC 430 10/13/2014   FERRITIN 10 10/13/2014   Attestation Statements:   Reviewed by clinician on day of visit: allergies, medications, problem list, medical history, surgical history,  family history, social history, and previous encounter notes.  Wilhemena Durie, am acting as Location manager for [employer], DO.  I have reviewed the above documentation for accuracy and completeness, and I agree with the above. Jearld Lesch, DO

## 2022-07-18 ENCOUNTER — Ambulatory Visit (INDEPENDENT_AMBULATORY_CARE_PROVIDER_SITE_OTHER): Payer: BC Managed Care – PPO | Admitting: Family Medicine

## 2022-07-18 ENCOUNTER — Encounter (INDEPENDENT_AMBULATORY_CARE_PROVIDER_SITE_OTHER): Payer: Self-pay | Admitting: Family Medicine

## 2022-07-18 VITALS — BP 108/76 | HR 78 | Temp 98.0°F | Ht 63.0 in | Wt 205.0 lb

## 2022-07-18 DIAGNOSIS — E559 Vitamin D deficiency, unspecified: Secondary | ICD-10-CM

## 2022-07-18 DIAGNOSIS — E669 Obesity, unspecified: Secondary | ICD-10-CM | POA: Diagnosis not present

## 2022-07-18 DIAGNOSIS — R7303 Prediabetes: Secondary | ICD-10-CM | POA: Diagnosis not present

## 2022-07-18 DIAGNOSIS — Z6836 Body mass index (BMI) 36.0-36.9, adult: Secondary | ICD-10-CM

## 2022-07-26 NOTE — Progress Notes (Signed)
Chief Complaint:   OBESITY Margaret [name] is here to discuss her progress with her obesity treatment plan along with follow-up of her obesity related diagnoses. Margaret [name] is on the Category 2 Plan and states she is following her eating plan approximately 85% of the time. [name] states she is swimming for 30 minutes 1 time per week.  Today's visit was #: 3 Starting weight: 217 lbs Starting date: 06/14/2022 Today's weight: 205 lbs Today's date: 07/18/2022 Total lbs lost to date: 12 Total lbs lost since last in-office visit: 6  Interim History: Margaret [name] continues to do well with weight loss, but she may be starting to lose muscle mass.   Subjective:   1. Vitamin D deficiency Margaret [name] is stable on Vitamin D, and she denies nausea, vomiting, or muscle weakness.   2. Prediabetes Margaret [name] has elevated glucose, fasting insulin, and A1c. She is working on decreasing simple carbohydrates. She is not on metformin.   Assessment/Plan:   1. Vitamin D deficiency Margaret [name] will continue OTC Vitamin D, and we will recheck labs in 2 months.   2. Prediabetes Margaret [name] will continue with her diet and exercise, and we will plan to recheck labs in 2 months.   3. Obesity, current BMI 36.3 Margaret [name] is currently in the action stage of change. As such, her goal is to continue with weight loss efforts. She has agreed to the Category 2 Plan.   The importance of protein and mechanism of how to affect metabolism and endocrine hormones were discussed today in depth.  Exercise goals: As is, add strengthening exercise.  Behavioral modification strategies: increasing lean protein intake.  Margaret [name] has agreed to follow-up with our clinic in 3 weeks. She was informed of the importance of frequent follow-up visits to maximize her success with intensive lifestyle modifications for her multiple health conditions.   Objective:   Blood pressure 108/76, pulse 78, temperature 98 F (36.7 C), height '5\' 3"'$  (1.6 m), weight 205 lb (93  kg), last menstrual period 09/14/2018, SpO2 97 %. Body mass index is 36.31 kg/m.  General: Cooperative, alert, well developed, in no acute distress. HEENT: Conjunctivae and lids unremarkable. Cardiovascular: Regular rhythm.  Lungs: Normal work of breathing. Neurologic: No focal deficits.   Lab Results  Component Value Date   CREATININE 0.75 06/14/2022   BUN 13 06/14/2022   NA 139 06/14/2022   K 4.7 06/14/2022   CL 102 06/14/2022   CO2 21 06/14/2022   Lab Results  Component Value Date   ALT 17 06/14/2022   AST 15 06/14/2022   ALKPHOS 53 06/14/2022   BILITOT 0.3 06/14/2022   Lab Results  Component Value Date   HGBA1C 6.1 (H) 06/14/2022   HGBA1C 6.3 01/05/2022   HGBA1C 6.4 07/05/2021   HGBA1C 6.0 12/01/2020   HGBA1C 5.6 05/13/2020   Lab Results  Component Value Date   INSULIN 17.6 06/14/2022   Lab Results  Component Value Date   TSH 1.390 06/14/2022   Lab Results  Component Value Date   CHOL 204 (H) 06/14/2022   HDL 52 06/14/2022   LDLCALC 130 (H) 06/14/2022   TRIG 122 06/14/2022   CHOLHDL 4 01/05/2022   Lab Results  Component Value Date   VD25OH 32.2 06/14/2022   VD25OH 11.98 (L) 01/05/2022   Lab Results  Component Value Date   WBC 7.0 01/05/2022   HGB 11.8 (L) 01/05/2022   HCT 37.7 01/05/2022   MCV 76.7 (L) 01/05/2022   PLT 302.0 01/05/2022   Lab Results  Component Value Date   IRON 36 (L) 10/13/2014   TIBC 430 10/13/2014   FERRITIN 10 10/13/2014   Attestation Statements:   Reviewed by clinician on day of visit: allergies, medications, problem list, medical history, surgical history, family history, social history, and previous encounter notes.  Time spent on visit including pre-visit chart review and post-visit care and charting was 40 minutes.   I, Trixie Dredge, am acting as transcriptionist for Dennard Nip, MD.  I have reviewed the above documentation for accuracy and completeness, and I agree with the above. -  Dennard Nip, MD

## 2022-08-20 ENCOUNTER — Encounter: Payer: Self-pay | Admitting: *Deleted

## 2022-08-21 ENCOUNTER — Encounter (INDEPENDENT_AMBULATORY_CARE_PROVIDER_SITE_OTHER): Payer: Self-pay | Admitting: Family Medicine

## 2022-08-21 ENCOUNTER — Ambulatory Visit (INDEPENDENT_AMBULATORY_CARE_PROVIDER_SITE_OTHER): Payer: BC Managed Care – PPO | Admitting: Family Medicine

## 2022-08-21 VITALS — BP 117/71 | HR 70 | Temp 97.9°F | Ht 63.0 in | Wt 199.0 lb

## 2022-08-21 DIAGNOSIS — E669 Obesity, unspecified: Secondary | ICD-10-CM

## 2022-08-21 DIAGNOSIS — Z6835 Body mass index (BMI) 35.0-35.9, adult: Secondary | ICD-10-CM | POA: Diagnosis not present

## 2022-08-21 DIAGNOSIS — F3289 Other specified depressive episodes: Secondary | ICD-10-CM

## 2022-08-21 DIAGNOSIS — F32A Depression, unspecified: Secondary | ICD-10-CM | POA: Insufficient documentation

## 2022-08-23 NOTE — Progress Notes (Signed)
Chief Complaint:   OBESITY Margaret [name] is here to discuss her progress with her obesity treatment plan along with follow-up of her obesity related diagnoses. Margaret [name] is on the Category 2 Plan and states she is following her eating plan approximately 60-70% of the time. Margaret [name] states she is on the elliptical for 30 minutes 2 times per week.  Today's visit was #: 4 Starting weight: 217 lbs Starting date: 06/14/2022 Today's weight: 199 lbs Today's date: 08/23/2022 Total lbs lost to date: 18 Total lbs lost since last in-office visit: 6  Interim History: Margaret [name] is doing very well with weight loss. 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 She notes some increased stress with work and home life activities. She makes or drinks protein shakes for breakfast. She is doing well with increasing protein.   Subjective:   1. Other depression, emotional eating behaviors Margaret [name] is tolerating Wellbutrin with no side effects. She denies cravings overall.   Assessment/Plan:   1. Other depression, emotional eating behaviors Margaret [name] will continue Wellbutrin, diet, and exercise.   2. Obesity, current BMI 35.4 Margaret [name] is currently in the action stage of change. As such, her goal is to continue with weight loss efforts. She has agreed to the Category 2 Plan.   Margaret [name] will continue with her diet and exercise. She was encouraged to keep working on increasing protein.   Exercise goals: As is.   Behavioral modification strategies: increasing lean protein intake, decreasing simple carbohydrates, and meal planning and cooking strategies.  Margaret [name] has agreed to follow-up with our clinic in 3 to 4 weeks. She was informed of the importance of frequent follow-up visits to maximize her success with intensive lifestyle modifications for her multiple health conditions.   Objective:   Blood pressure 117/71, pulse 70, temperature 97.9 F (36.6 C), height '5\' 3"'$  (1.6 m), weight  199 lb (90.3 kg), last menstrual period 09/14/2018, SpO2 98 %. Body mass index is 35.25 kg/m.  General: Cooperative, alert, well developed, in no acute distress. HEENT: Conjunctivae and lids unremarkable. Cardiovascular: Regular rhythm.  Lungs: Normal work of breathing. Neurologic: No focal deficits.   Lab Results  Component Value Date   CREATININE 0.75 06/14/2022   BUN 13 06/14/2022   NA 139 06/14/2022   K 4.7 06/14/2022   CL 102 06/14/2022   CO2 21 06/14/2022   Lab Results  Component Value Date   ALT 17 06/14/2022   AST 15 06/14/2022   ALKPHOS 53 06/14/2022   BILITOT 0.3 06/14/2022   Lab Results  Component Value Date   HGBA1C 6.1 (H) 06/14/2022   HGBA1C 6.3 01/05/2022   HGBA1C 6.4 07/05/2021   HGBA1C 6.0 12/01/2020   HGBA1C 5.6 05/13/2020   Lab Results  Component Value Date   INSULIN 17.6 06/14/2022   Lab Results  Component Value Date   TSH 1.390 06/14/2022   Lab Results  Component Value Date   CHOL 204 (H) 06/14/2022   HDL 52 06/14/2022   LDLCALC 130 (H) 06/14/2022   TRIG 122 06/14/2022   CHOLHDL 4 01/05/2022   Lab Results  Component Value Date   VD25OH 32.2 06/14/2022   VD25OH 11.98 (L) 01/05/2022   Lab Results  Component Value Date   WBC 7.0 01/05/2022   HGB 11.8 (L) 01/05/2022   HCT 37.7 01/05/2022   MCV 76.7 (L) 01/05/2022   PLT 302.0 01/05/2022   Lab Results  Component Value Date   IRON 36 (L) 10/13/2014  TIBC 430 10/13/2014   FERRITIN 10 10/13/2014   Attestation Statements:   Reviewed by clinician on day of visit: allergies, medications, problem list, medical history, surgical history, family history, social history, and previous encounter notes.  Time spent on visit including pre-visit chart review and post-visit care and charting was 30 minutes.   I, Trixie Dredge, am acting as transcriptionist for Dennard Nip, MD.  I have reviewed the above documentation for accuracy and completeness, and I agree with the above. -  Dennard Nip, MD

## 2022-08-24 ENCOUNTER — Ambulatory Visit: Payer: BC Managed Care – PPO | Admitting: Physician Assistant

## 2022-08-28 ENCOUNTER — Ambulatory Visit: Payer: BC Managed Care – PPO | Admitting: Physician Assistant

## 2022-08-31 ENCOUNTER — Ambulatory Visit: Payer: BC Managed Care – PPO | Admitting: Physician Assistant

## 2022-08-31 NOTE — Progress Notes (Addendum)
   Referring Provider Allwardt, Randa Evens, PA-C [address] [place],  [place] [address]   CC:  Chief Complaint  Patient presents with   Follow-up      Margaret [name] is an 46 y.o. female.  HPI: Patient is a 46 y.o. year old female here for follow up after [facility] with regard to her large pannus and consideration for panniculectomy.  She was seen for initial consult by Dr. Marla Roe.  At that time, she reported a hemoglobin A1c of 6.3 and was complaining of back pain and recurrent infra pannus rashes.  Past surgical history notable for C-section and hysterectomy.  Complained of large overhanging pannus.  Discussed possible panniculectomy.  Referral placed to physical therapy and to [facility].  Plan was for her to return in 6 months for follow-up.  Today, patient is doing well.  She states that she has been going [facility] for the past couple of months and has lost approximately 20 pounds.  She endorses 1 to 2 pound weight loss per week.  She does endorse continued low back discomfort which she attributes to her overhanging pannus.  She also endorses intermittent rashes for which she will treat with nystatin powder.  Patient reports a remote history of physical therapy for her chronic low back pain which failed to alleviate her discomfort.  She also had an MRI at one point that reportedly did not reveal any obvious explanation for her symptoms.    Review of Systems General: Denies fevers MSK: Endorses ongoing lower back discomfort Skin: Endorses current infra pannus rash  Physical Exam    09/03/2022    8:26 AM 08/21/2022    3:00 PM 07/18/2022    4:00 PM  Vitals with BMI  Height  '5\' 3"'$  '5\' 3"'$   Weight 201 lbs 199 lbs 205 lbs  BMI 35.61 80.99 83.38  Systolic  250 539  Diastolic  71 76  Pulse  70 78    General:  No acute distress,  Alert and oriented, Non-Toxic, Normal speech and  affect Psych: Normal behavior and mood Respiratory: No increased WOB MSK: Ambulatory Skin: Erythematous intertriginous infra pannus rash (see picture)  Assessment/Plan  Patient is interested in pursuing surgical intervention for panniculectomy. Patient has been routinely going [facility] the [facility] as well as treating her recurrent infra pannus rashes with nystatin powder.  We will submit to insurance for authorization for panniculectomy.  She is aware that this process can take up to 6 weeks.  Krista Blue 09/03/2022, 2:57 PM

## 2022-09-03 ENCOUNTER — Ambulatory Visit (INDEPENDENT_AMBULATORY_CARE_PROVIDER_SITE_OTHER): Payer: BC Managed Care – PPO | Admitting: Physician Assistant

## 2022-09-03 ENCOUNTER — Encounter: Payer: Self-pay | Admitting: Physician Assistant

## 2022-09-03 VITALS — Wt 201.0 lb

## 2022-09-03 DIAGNOSIS — M545 Low back pain, unspecified: Secondary | ICD-10-CM

## 2022-09-03 DIAGNOSIS — M793 Panniculitis, unspecified: Secondary | ICD-10-CM

## 2022-09-03 DIAGNOSIS — R21 Rash and other nonspecific skin eruption: Secondary | ICD-10-CM | POA: Diagnosis not present

## 2022-09-17 ENCOUNTER — Other Ambulatory Visit: Payer: Self-pay | Admitting: Physician Assistant

## 2022-09-18 ENCOUNTER — Ambulatory Visit (INDEPENDENT_AMBULATORY_CARE_PROVIDER_SITE_OTHER): Payer: BC Managed Care – PPO | Admitting: Family Medicine

## 2022-09-18 ENCOUNTER — Other Ambulatory Visit (HOSPITAL_COMMUNITY): Payer: Self-pay

## 2022-09-18 ENCOUNTER — Encounter (INDEPENDENT_AMBULATORY_CARE_PROVIDER_SITE_OTHER): Payer: Self-pay | Admitting: Family Medicine

## 2022-09-18 VITALS — BP 131/78 | HR 80 | Temp 98.1°F | Ht 63.0 in | Wt 196.0 lb

## 2022-09-18 DIAGNOSIS — E669 Obesity, unspecified: Secondary | ICD-10-CM | POA: Diagnosis not present

## 2022-09-18 DIAGNOSIS — Z6834 Body mass index (BMI) 34.0-34.9, adult: Secondary | ICD-10-CM | POA: Diagnosis not present

## 2022-09-18 DIAGNOSIS — B372 Candidiasis of skin and nail: Secondary | ICD-10-CM | POA: Diagnosis not present

## 2022-09-18 DIAGNOSIS — F3289 Other specified depressive episodes: Secondary | ICD-10-CM

## 2022-09-18 MED ORDER — NYSTATIN 100000 UNIT/GM EX POWD
1.0000 | Freq: Four times a day (QID) | CUTANEOUS | 0 refills | Status: DC
  Filled 2022-09-18: qty 60, 30d supply, fill #0

## 2022-09-18 MED ORDER — TOPIRAMATE 50 MG PO TABS
25.0000 mg | ORAL_TABLET | Freq: Every day | ORAL | 0 refills | Status: DC
  Filled 2022-09-18: qty 30, 60d supply, fill #0

## 2022-09-24 NOTE — Progress Notes (Unsigned)
Chief Complaint:   OBESITY Margaret [name] is here to discuss her progress with her obesity treatment plan along with follow-up of her obesity related diagnoses. Margaret [name] is on the Category 2 Plan and states she is following her eating plan approximately 60% of the time. Margaret [name] states she is doing 0 minutes 0 times per week.  Today's visit was #: 5 Starting weight: 217 lbs Starting date: 06/14/2022 Today's weight: 196 lbs Today's date: 09/18/2022 Total lbs lost to date: 21 Total lbs lost since last in-office visit: 3  Interim History: Margaret [name] has done well with with weight loss, but she has done more sweet snacking and she is struggling with cravings. She is very busy and she has been skipping meals.   Subjective:   1. Candidiasis, cutaneous [place] has an erythematous rash under her pannus. No improvement with powder. She took pictures to show me. She wants to have a panniculectomy to treat this chronic recurrent rash.   2. Other depression, emotional eating behaviors Margaret [name] is on Wellbutrin, but she is doing more sweets snacking.   Assessment/Plan:   1. Candidiasis, cutaneous Margaret [name] agreed to start Nystatin powder 4 times daily with no refills.   - nystatin (MYCOSTATIN/NYSTOP) powder; Apply topically 4 times daily.  Dispense: 60 g; Refill: 0  2. Other depression, emotional eating behaviors Margaret [name] agreed to start Topamax 50 mg qhs with no refills.   - topiramate (TOPAMAX) 50 MG tablet; Take 0.5 tablets (25 mg total) by mouth daily.  Dispense: 30 tablet; Refill: 0  3. Obesity, Current BMI 34.9 Margaret [name] is currently in the action stage of change. As such, her goal is to continue with weight loss efforts. She has agreed to the Category 2 Plan.   Behavioral modification strategies: meal planning and cooking strategies.  Margaret [name] has agreed to follow-up with our clinic in 3 to 4 weeks. She was informed of the importance of frequent follow-up visits to maximize her success with intensive  lifestyle modifications for her multiple health conditions.   Objective:   Blood pressure 131/78, pulse 80, temperature 98.1 F (36.7 C), height '5\' 3"'$  (1.6 m), weight 196 lb (88.9 kg), last menstrual period 09/14/2018, SpO2 95 %. Body mass index is 34.72 kg/m.  General: Cooperative, alert, well developed, in no acute distress. HEENT: Conjunctivae and lids unremarkable. Cardiovascular: Regular rhythm.  Lungs: Normal work of breathing. Neurologic: No focal deficits.   Lab Results  Component Value Date   CREATININE 0.75 06/14/2022   BUN 13 06/14/2022   NA 139 06/14/2022   K 4.7 06/14/2022   CL 102 06/14/2022   CO2 21 06/14/2022   Lab Results  Component Value Date   ALT 17 06/14/2022   AST 15 06/14/2022   ALKPHOS 53 06/14/2022   BILITOT 0.3 06/14/2022   Lab Results  Component Value Date   HGBA1C 6.1 (H) 06/14/2022   HGBA1C 6.3 01/05/2022   HGBA1C 6.4 07/05/2021   HGBA1C 6.0 12/01/2020   HGBA1C 5.6 05/13/2020   Lab Results  Component Value Date   INSULIN 17.6 06/14/2022   Lab Results  Component Value Date   TSH 1.390 06/14/2022   Lab Results  Component Value Date   CHOL 204 (H) 06/14/2022   HDL 52 06/14/2022   LDLCALC 130 (H) 06/14/2022   TRIG 122 06/14/2022   CHOLHDL 4 01/05/2022   Lab Results  Component Value Date   VD25OH 32.2 06/14/2022   VD25OH 11.98 (L) 01/05/2022   Lab Results  Component Value Date  WBC 7.0 01/05/2022   HGB 11.8 (L) 01/05/2022   HCT 37.7 01/05/2022   MCV 76.7 (L) 01/05/2022   PLT 302.0 01/05/2022   Lab Results  Component Value Date   IRON 36 (L) 10/13/2014   TIBC 430 10/13/2014   FERRITIN 10 10/13/2014   Attestation Statements:   Reviewed by clinician on day of visit: allergies, medications, problem list, medical history, surgical history, family history, social history, and previous encounter notes.   I, Trixie Dredge, am acting as transcriptionist for Dennard Nip, MD.  I have reviewed the above documentation for  accuracy and completeness, and I agree with the above. -  Dennard Nip, MD

## 2022-10-01 ENCOUNTER — Telehealth: Payer: Self-pay

## 2022-10-01 ENCOUNTER — Encounter: Payer: Self-pay | Admitting: Plastic Surgery

## 2022-10-01 NOTE — Telephone Encounter (Signed)
Faxed OV, HWW notes, and photos to [facility] CPT code [phone] Case [phone]

## 2022-10-02 ENCOUNTER — Encounter: Payer: Self-pay | Admitting: Physician Assistant

## 2022-10-02 ENCOUNTER — Ambulatory Visit (INDEPENDENT_AMBULATORY_CARE_PROVIDER_SITE_OTHER): Payer: BC Managed Care – PPO | Admitting: Physician Assistant

## 2022-10-02 DIAGNOSIS — F319 Bipolar disorder, unspecified: Secondary | ICD-10-CM

## 2022-10-02 DIAGNOSIS — F411 Generalized anxiety disorder: Secondary | ICD-10-CM | POA: Diagnosis not present

## 2022-10-02 NOTE — Progress Notes (Signed)
[facility]  Patient ID: Margaret [name],  MRN: [id number]  PCP: Fredirick Lathe, PA-C  Date of Evaluation: 10/02/2022 Time spent:20 minutes  Chief Complaint:  Chief Complaint   Depression; Follow-up    HISTORY/CURRENT STATUS: For routine med check.  Gets overwhelmed sometimes, stressed w/ having too much on her plate. Feels fight or flight sometimes, although no panic attacks.  Does not think she needs anything specific for anxiety necessarily.  Patient is able to enjoy things.  Energy and motivation are good.  Work is going well.   No extreme sadness, tearfulness, or feelings of hopelessness.  Sleeps well most of the time.  ADLs and personal hygiene are normal.   Denies any changes in concentration, making decisions, or remembering things.  Appetite has not changed.  Weight is stable. Not isolating.  Denies suicidal or homicidal thoughts.  She would like to get off of the Wellbutrin at some point if possible.  Has a lot going on this winter and spring though.  Patient denies increased energy with decreased need for sleep, increased talkativeness, racing thoughts, impulsivity or risky behaviors, increased spending, increased libido, grandiosity, increased irritability or anger, paranoia, or hallucinations.  Denies dizziness, syncope, seizures, numbness, tingling, tremor, tics, unsteady gait, slurred speech, confusion. Denies muscle or joint pain, stiffness, or dystonia.  Individual Medical History/ Review of Systems: Changes? :Yes    having surgery in Feb, for excess skin, low abd d/t severe rash, had twins which stretched her abd and also lost a lot of weight.   Past medications for mental health diagnoses include: Lexapro caused sexual side effects, Depakote, Effexor, Zoloft, Prozac, Trileptal, Lamictal, Wellbutrin  Allergies: Dilaudid [hydromorphone], Amoxicillin, Ancef [cefazolin], Penicillins, Adhesive [tape], and Keflex [cephalexin]  Current Medications:   Current Outpatient Medications:    buPROPion (WELLBUTRIN XL) 300 MG 24 hr tablet, TAKE 1 TABLET DAILY, Disp: 90 tablet, Rfl: 0   ibuprofen (ADVIL,MOTRIN) 200 MG tablet, Take 600 mg by mouth every 6 (six) hours as needed for headache or mild pain., Disp: , Rfl:    nystatin (MYCOSTATIN/NYSTOP) powder, Apply topically 4 times daily., Disp: 60 g, Rfl: 0   oxybutynin (DITROPAN-XL) 10 MG 24 hr tablet, TAKE 1 TABLET AT BEDTIME, Disp: 90 tablet, Rfl: 3   valACYclovir (VALTREX) 500 MG tablet, TAKE 1 TABLET (500 MG TOTAL) BY MOUTH 2 (TWO) TIMES DAILY. FOR 3 DAYS, Disp: 30 tablet, Rfl: 1   albuterol (PROVENTIL HFA;VENTOLIN HFA) 108 (90 BASE) MCG/ACT inhaler, Inhale 2 puffs into the lungs every 6 (six) hours as needed for wheezing or shortness of breath. (Patient not taking: Reported on 10/02/2022), Disp: , Rfl:    augmented betamethasone dipropionate (DIPROLENE-AF) 0.05 % ointment, Apply to affected area on the skin 2 times a day as needed for inflammation (Patient not taking: Reported on 10/02/2022), Disp: 50 g, Rfl: 0   B Complex Vitamins (B-COMPLEX/B-12 PO), Take by mouth. (Patient not taking: Reported on 10/02/2022), Disp: , Rfl:    cholecalciferol (VITAMIN D3) 25 MCG (1000 UNIT) tablet, Take 1,000 Units by mouth daily. (Patient not taking: Reported on 10/02/2022), Disp: , Rfl:    docusate sodium (COLACE) 100 MG capsule, Take 100 mg by mouth 2 (two) times daily. (Patient not taking: Reported on 10/02/2022), Disp: , Rfl:    ferrous sulfate 324 (65 Fe) MG TBEC, Take by mouth. (Patient not taking: Reported on 10/02/2022), Disp: , Rfl:    sennosides-docusate sodium (SENOKOT-S) 8.6-50 MG tablet, Take 1 tablet by mouth 2 (two) times daily. (Patient  not taking: Reported on 10/02/2022), Disp: , Rfl:    topiramate (TOPAMAX) 50 MG tablet, Take 0.5 tablets (25 mg total) by mouth daily. (Patient not taking: Reported on 10/02/2022), Disp: 30 tablet, Rfl: 0 Medication Side Effects: none  Family Medical/ Social History:  Changes? No  MENTAL HEALTH EXAM:  Last menstrual period 09/14/2018.There is no height or weight on file to calculate BMI.  General Appearance: Casual, Neat, Well Groomed and Obese  Eye Contact:  Good  Speech:  Clear and Coherent and Normal Rate  Volume:  Normal  Mood:  Euthymic  Affect:  Appropriate  Thought Process:  Goal Directed and Descriptions of Associations: Circumstantial  Orientation:  Full (Time, Place, and Person)  Thought Content: Logical   Suicidal Thoughts:  No  Homicidal Thoughts:  No  Memory:  WNL  Judgement:  Good  Insight:  Good  Psychomotor Activity:  Normal  Concentration:  Concentration: Good and Attention Span: Good  Recall:  Good  Fund of Knowledge: Good  Language: Good  Assets:  Desire for Improvement  ADL's:  Intact  Cognition: WNL  Prognosis:  Good   DIAGNOSES:    ICD-10-CM   1. Bipolar I disorder ([place])  F31.9     2. Generalized anxiety disorder  F41.1      Receiving Psychotherapy: No   RECOMMENDATIONS:  PDMP reviewed.  No results available. I provided 20 minutes of face to face time during this encounter, including time spent before and after the visit in records review, medical decision making, counseling pertinent to today's visit, and charting.   Discussed anxiety. It's more circumstantial, try to avoid those triggers if possible.  Recommend making no changes right now but if she wants to decrease the Wellbutrin before her next visit she can call and I will send in the Wellbutrin XL 150 mg.    Continue Wellbutrin XL 300 mg daily. Return in 5 months.  After the holidays, her upcoming surgery probably in February, and a family wedding in March)  Donnal Moat, [place]

## 2022-10-15 ENCOUNTER — Telehealth: Payer: Self-pay

## 2022-10-15 NOTE — Telephone Encounter (Signed)
Patient is approved from 12/10/2022-02/08/2023 case#[id number].

## 2022-10-16 ENCOUNTER — Ambulatory Visit (INDEPENDENT_AMBULATORY_CARE_PROVIDER_SITE_OTHER): Payer: BC Managed Care – PPO | Admitting: Family Medicine

## 2022-10-26 ENCOUNTER — Telehealth: Payer: Self-pay | Admitting: *Deleted

## 2022-10-26 NOTE — Telephone Encounter (Signed)
Spoke with patient to schedule surgery and all req appointments

## 2022-10-30 ENCOUNTER — Encounter (INDEPENDENT_AMBULATORY_CARE_PROVIDER_SITE_OTHER): Payer: Self-pay | Admitting: Family Medicine

## 2022-10-30 ENCOUNTER — Other Ambulatory Visit (HOSPITAL_COMMUNITY): Payer: Self-pay

## 2022-10-30 ENCOUNTER — Ambulatory Visit (INDEPENDENT_AMBULATORY_CARE_PROVIDER_SITE_OTHER): Payer: BC Managed Care – PPO | Admitting: Family Medicine

## 2022-10-30 VITALS — BP 105/70 | HR 78 | Temp 98.4°F | Ht 63.0 in | Wt 194.0 lb

## 2022-10-30 DIAGNOSIS — Z6834 Body mass index (BMI) 34.0-34.9, adult: Secondary | ICD-10-CM | POA: Diagnosis not present

## 2022-10-30 DIAGNOSIS — E669 Obesity, unspecified: Secondary | ICD-10-CM | POA: Diagnosis not present

## 2022-10-30 DIAGNOSIS — R7303 Prediabetes: Secondary | ICD-10-CM

## 2022-10-30 DIAGNOSIS — F3289 Other specified depressive episodes: Secondary | ICD-10-CM | POA: Diagnosis not present

## 2022-10-30 MED ORDER — METFORMIN HCL 500 MG PO TABS
500.0000 mg | ORAL_TABLET | Freq: Every day | ORAL | 0 refills | Status: DC
  Filled 2022-10-30: qty 30, 30d supply, fill #0

## 2022-10-31 ENCOUNTER — Other Ambulatory Visit (HOSPITAL_COMMUNITY): Payer: Self-pay

## 2022-11-08 ENCOUNTER — Encounter: Payer: Self-pay | Admitting: *Deleted

## 2022-11-12 NOTE — Progress Notes (Signed)
Chief Complaint:   OBESITY Margaret [name] is here to discuss her progress with her obesity treatment plan along with follow-up of her obesity related diagnoses. Margaret [name] is on the Category 2 Plan and states she is following her eating plan approximately 80% of the time. Margaret [name] states she is doing aerobics for 30 minutes 3-4 times per week.  Today's visit was #: 6 Starting weight: 217 lbs Starting date: 06/14/2022 Today's weight: 194 lbs Today's date: 10/30/2022 Total lbs lost to date: 23 Total lbs lost since last in-office visit: 2  Interim History: Margaret [name] did some celebration eating over Thanksgiving, but she has already gotten back on track. She will be doing a lot of social eating this month and she is willing to discuss social eating.   Subjective:   1. Prediabetes Margaret [name] is working on her diet and weight loss.   2. Other depression, emotional eating behaviors Margaret [name] started topiramate and she noted significant fatigue, but no improvement in her emotional eating behaviors.   Assessment/Plan:   1. Prediabetes Margaret [name] agreed to start metformin 500 mg q AM with food, with no refills. Margaret [name] will continue to work on weight loss, exercise, and decreasing simple carbohydrates to help decrease the risk of diabetes.   - metFORMIN (GLUCOPHAGE) 500 MG tablet; Take 1 tablet (500 mg total) by mouth daily.  Dispense: 30 tablet; Refill: 0  2. Other depression, emotional eating behaviors Margaret [name] agreed to discontinue topiramate, and she will continue to work on recognizing emotional eating behaviors.   3. Obesity, Current BMI 34.4 Margaret [name] is currently in the action stage of change. As such, her goal is to continue with weight loss efforts. She has agreed to the Category 2 Plan.   Exercise goals: As is.   Behavioral modification strategies: increasing lean protein intake, emotional eating strategies, holiday eating strategies , and celebration eating strategies.  Margaret [name] has agreed to follow-up  with our clinic in 4 weeks. She was informed of the importance of frequent follow-up visits to maximize her success with intensive lifestyle modifications for her multiple health conditions.   Objective:   Blood pressure 105/70, pulse 78, temperature 98.4 F (36.9 C), height '5\' 3"'$  (1.6 m), weight 194 lb (88 kg), last menstrual period 09/14/2018, SpO2 98 %. Body mass index is 34.37 kg/m.  General: Cooperative, alert, well developed, in no acute distress. HEENT: Conjunctivae and lids unremarkable. Cardiovascular: Regular rhythm.  Lungs: Normal work of breathing. Neurologic: No focal deficits.   Lab Results  Component Value Date   CREATININE 0.75 06/14/2022   BUN 13 06/14/2022   NA 139 06/14/2022   K 4.7 06/14/2022   CL 102 06/14/2022   CO2 21 06/14/2022   Lab Results  Component Value Date   ALT 17 06/14/2022   AST 15 06/14/2022   ALKPHOS 53 06/14/2022   BILITOT 0.3 06/14/2022   Lab Results  Component Value Date   HGBA1C 6.1 (H) 06/14/2022   HGBA1C 6.3 01/05/2022   HGBA1C 6.4 07/05/2021   HGBA1C 6.0 12/01/2020   HGBA1C 5.6 05/13/2020   Lab Results  Component Value Date   INSULIN 17.6 06/14/2022   Lab Results  Component Value Date   TSH 1.390 06/14/2022   Lab Results  Component Value Date   CHOL 204 (H) 06/14/2022   HDL 52 06/14/2022   LDLCALC 130 (H) 06/14/2022   TRIG 122 06/14/2022   CHOLHDL 4 01/05/2022   Lab Results  Component Value Date   VD25OH 32.2 06/14/2022   VD25OH 11.98 (  L) 01/05/2022   Lab Results  Component Value Date   WBC 7.0 01/05/2022   HGB 11.8 (L) 01/05/2022   HCT 37.7 01/05/2022   MCV 76.7 (L) 01/05/2022   PLT 302.0 01/05/2022   Lab Results  Component Value Date   IRON 36 (L) 10/13/2014   TIBC 430 10/13/2014   FERRITIN 10 10/13/2014   Attestation Statements:   Reviewed by clinician on day of visit: allergies, medications, problem list, medical history, surgical history, family history, social history, and previous encounter  notes.   I, Trixie Dredge, am acting as transcriptionist for Dennard Nip, MD.  I have reviewed the above documentation for accuracy and completeness, and I agree with the above. -  Dennard Nip, MD

## 2022-11-13 ENCOUNTER — Encounter (INDEPENDENT_AMBULATORY_CARE_PROVIDER_SITE_OTHER): Payer: Self-pay | Admitting: Family Medicine

## 2022-11-27 NOTE — Telephone Encounter (Signed)
Ok x 90 days, no rf

## 2022-12-06 ENCOUNTER — Ambulatory Visit (INDEPENDENT_AMBULATORY_CARE_PROVIDER_SITE_OTHER): Payer: BC Managed Care – PPO | Admitting: Family Medicine

## 2022-12-17 ENCOUNTER — Other Ambulatory Visit: Payer: Self-pay | Admitting: Obstetrics and Gynecology

## 2022-12-17 DIAGNOSIS — Z1231 Encounter for screening mammogram for malignant neoplasm of breast: Secondary | ICD-10-CM

## 2022-12-18 ENCOUNTER — Encounter (INDEPENDENT_AMBULATORY_CARE_PROVIDER_SITE_OTHER): Payer: Self-pay | Admitting: Family Medicine

## 2022-12-18 ENCOUNTER — Other Ambulatory Visit (HOSPITAL_COMMUNITY): Payer: Self-pay

## 2022-12-18 ENCOUNTER — Ambulatory Visit (INDEPENDENT_AMBULATORY_CARE_PROVIDER_SITE_OTHER): Payer: BC Managed Care – PPO | Admitting: Family Medicine

## 2022-12-18 VITALS — BP 107/68 | HR 70 | Temp 97.9°F | Ht 63.0 in | Wt 186.0 lb

## 2022-12-18 DIAGNOSIS — R7303 Prediabetes: Secondary | ICD-10-CM | POA: Diagnosis not present

## 2022-12-18 DIAGNOSIS — K5909 Other constipation: Secondary | ICD-10-CM | POA: Diagnosis not present

## 2022-12-18 DIAGNOSIS — B372 Candidiasis of skin and nail: Secondary | ICD-10-CM | POA: Diagnosis not present

## 2022-12-18 DIAGNOSIS — E669 Obesity, unspecified: Secondary | ICD-10-CM | POA: Diagnosis not present

## 2022-12-18 DIAGNOSIS — Z6834 Body mass index (BMI) 34.0-34.9, adult: Secondary | ICD-10-CM | POA: Insufficient documentation

## 2022-12-18 DIAGNOSIS — Z6833 Body mass index (BMI) 33.0-33.9, adult: Secondary | ICD-10-CM

## 2022-12-18 MED ORDER — METFORMIN HCL 500 MG PO TABS
500.0000 mg | ORAL_TABLET | Freq: Two times a day (BID) | ORAL | 0 refills | Status: DC
  Filled 2022-12-18 – 2022-12-28 (×2): qty 180, 90d supply, fill #0

## 2022-12-25 ENCOUNTER — Encounter: Payer: Self-pay | Admitting: Plastic Surgery

## 2022-12-25 ENCOUNTER — Encounter: Payer: Self-pay | Admitting: Surgical

## 2022-12-25 ENCOUNTER — Ambulatory Visit (INDEPENDENT_AMBULATORY_CARE_PROVIDER_SITE_OTHER): Payer: BC Managed Care – PPO | Admitting: Surgical

## 2022-12-25 ENCOUNTER — Other Ambulatory Visit (HOSPITAL_COMMUNITY): Payer: Self-pay

## 2022-12-25 VITALS — BP 142/83 | HR 80

## 2022-12-25 DIAGNOSIS — G8929 Other chronic pain: Secondary | ICD-10-CM

## 2022-12-25 DIAGNOSIS — R7303 Prediabetes: Secondary | ICD-10-CM

## 2022-12-25 DIAGNOSIS — M545 Low back pain, unspecified: Secondary | ICD-10-CM

## 2022-12-25 DIAGNOSIS — M793 Panniculitis, unspecified: Secondary | ICD-10-CM

## 2022-12-25 MED ORDER — OXYCODONE HCL 5 MG PO TABS
5.0000 mg | ORAL_TABLET | Freq: Four times a day (QID) | ORAL | 0 refills | Status: AC | PRN
  Filled 2022-12-25: qty 12, 3d supply, fill #0

## 2022-12-25 MED ORDER — ONDANSETRON HCL 4 MG PO TABS
4.0000 mg | ORAL_TABLET | Freq: Three times a day (TID) | ORAL | 0 refills | Status: DC | PRN
  Filled 2022-12-25 – 2022-12-26 (×2): qty 20, 7d supply, fill #0

## 2022-12-25 MED ORDER — SULFAMETHOXAZOLE-TRIMETHOPRIM 800-160 MG PO TABS
1.0000 | ORAL_TABLET | Freq: Two times a day (BID) | ORAL | 0 refills | Status: AC
  Filled 2022-12-25: qty 6, 3d supply, fill #0

## 2022-12-25 NOTE — H&P (View-Only) (Signed)
Patient ID: AQUETZALLI DUB, female    DOB: 1976-06-29, 47 y.o.   MRN: [id number]  Chief Complaint  Patient presents with   Pre-op Exam      ICD-10-CM   1. Panniculitis  M79.3     2. Chronic bilateral low back pain, unspecified whether sciatica present  M54.50    G89.29     3. Pre-diabetes  R73.03       History of Present Illness: Margaret [name] is a 47 y.o.  female  with a history of abdominal pannus.  She presents for preoperative evaluation for upcoming procedure, panniculectomy, scheduled for 01/09/2023 with Dr. Marla Roe.  The patient has not had problems with anesthesia, but does report some postoperative nausea. No history of DVT/PE.  No family history of DVT/PE.  No family or personal history of bleeding or clotting disorders.  Patient is not currently taking any blood thinners.  No history of CVA/MI.  She does have a history of approximately 6 miscarriages -thought to be related to gluteal phase abnormalities.  Summary of Previous Visit: Patient with history of abdominal pannus -back pain and recurrent infra pannus rashes.  History of C-section and hysterectomy.  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  Will plan for 6 weeks out of work.  PMH Significant for: Prediabetes -most recent A1c 6.26 May 2022, hyperlipidemia, history of heart murmur (she reports that it was a slight murmur noted while she was pregnant, undetectable outside of pregnancy)  She has a history of seasonal/exercise-induced asthma, has not used her inhaler for quite some time.  Reports it is related to the summer and pollen season.  She has a history of PVCs, reports they are benign, she has had a Holter monitor for this and reports that she has no other cardiac abnormalities at this time.  Past Medical History: Allergies: Allergies  Allergen Reactions   Dilaudid [Hydromorphone] Itching   Amoxicillin Hives and Swelling    Has patient had a PCN reaction causing immediate rash,  facial/tongue/throat swelling, SOB or lightheadedness with hypotension: Yes Has patient had a PCN reaction causing severe rash involving mucus membranes or skin necrosis: No Has patient had a PCN reaction that required hospitalization: No Has patient had a PCN reaction occurring within the last 10 years: Yes If all of the above answers are "NO", then may proceed with Cephalosporin use.   Ancef [Cefazolin] Hives   Penicillins Hives and Swelling    Has patient had a PCN reaction causing immediate rash, facial/tongue/throat swelling, SOB or lightheadedness with hypotension: Yes Has patient had a PCN reaction causing severe rash involving mucus membranes or skin necrosis: No Has patient had a PCN reaction that required hospitalization: No Has patient had a PCN reaction occurring within the last 10 years: Yes If all of the above answers are "NO", then may proceed with Cephalosporin use.   Adhesive [Tape] Other (See Comments)    SKIN IRRITATION   Keflex [Cephalexin] Rash    Current Medications:  Current Outpatient Medications:    buPROPion (WELLBUTRIN XL) 300 MG 24 hr tablet, TAKE 1 TABLET DAILY, Disp: 90 tablet, Rfl: 0   cholecalciferol (VITAMIN D3) 25 MCG (1000 UNIT) tablet, Take 1,000 Units by mouth daily., Disp: , Rfl:    docusate sodium (COLACE) 100 MG capsule, Take 100 mg by mouth 2 (two) times daily., Disp: , Rfl:    ferrous sulfate 324 (65 Fe) MG TBEC, Take by mouth., Disp: , Rfl:  ibuprofen (ADVIL,MOTRIN) 200 MG tablet, Take 600 mg by mouth every 6 (six) hours as needed for headache or mild pain., Disp: , Rfl:    metFORMIN (GLUCOPHAGE) 500 MG tablet, Take 1 tablet (500 mg total) by mouth 2 (two) times daily with a meal., Disp: 180 tablet, Rfl: 0   nystatin (MYCOSTATIN/NYSTOP) powder, Apply topically 4 times daily., Disp: 60 g, Rfl: 0   ondansetron (ZOFRAN) 4 MG tablet, Take 1 tablet (4 mg total) by mouth every 8 (eight) hours as needed for nausea or vomiting., Disp: 20 tablet, Rfl:  0   oxybutynin (DITROPAN-XL) 10 MG 24 hr tablet, TAKE 1 TABLET AT BEDTIME, Disp: 90 tablet, Rfl: 3   oxyCODONE (OXY IR/ROXICODONE) 5 MG immediate release tablet, Take 1 tablet (5 mg total) by mouth every 6 (six) hours as needed for up to 3 days for severe pain., Disp: 12 tablet, Rfl: 0   sulfamethoxazole-trimethoprim (BACTRIM DS) 800-160 MG tablet, Take 1 tablet by mouth 2 (two) times daily for 3 days., Disp: 6 tablet, Rfl: 0   valACYclovir (VALTREX) 500 MG tablet, TAKE 1 TABLET (500 MG TOTAL) BY MOUTH 2 (TWO) TIMES DAILY. FOR 3 DAYS, Disp: 30 tablet, Rfl: 1  Past Medical Problems: Past Medical History:  Diagnosis Date   Abrasion of right thumb 04/19/2015   Anxiety    Asthma    Back pain    Carpal tunnel syndrome on both sides [id number]   Complication of anesthesia    Dental crown present    Diabetes mellitus without complication (HCC)    Exercise-induced asthma    prn inhaler  seasonal   Fatigue    Headache associated with hormonal factors    Heart burn    Heart murmur    slight mummur   History of endometrial ablation    History of partial hysterectomy 2019   Hyperlipidemia    Infertility, female    Low serum iron    no current med.   Obesity    Palpitations    PONV (postoperative nausea and vomiting)    nausea only   Pre-diabetes    PVCs (premature ventricular contractions)    states are benign   Seasonal allergies    SOB (shortness of breath)    Trigger finger     Past Surgical History: Past Surgical History:  Procedure Laterality Date   ABDOMINAL HYSTERECTOMY     Laparoscopic assisted Dr. Arvella Nigh 09-22-18   BREAST REDUCTION SURGERY Bilateral 10/27/2013   Procedure: MAMMARY REDUCTION  (BREAST) BILATERAL;  Surgeon: Erline Hau, MD;  Location: [place];  Service: Plastics;  Laterality: Bilateral;   CARPAL TUNNEL RELEASE Bilateral 04/22/2015   Procedure: BILATERAL CARPAL TUNNEL RELEASE;  Surgeon: Roseanne Kaufman, MD;  Location: [place];  Service: Orthopedics;  Laterality: Bilateral;   CESAREAN SECTION  04/23/2002   twins   CESAREAN SECTION  03/28/2005   DILATION AND CURETTAGE OF UTERUS     LAPAROSCOPIC VAGINAL HYSTERECTOMY WITH SALPINGECTOMY Bilateral 09/22/2018   Procedure: ABDOMINAL HYSTERECTOMY WITH BILATERAL SALPINGECTOMY;  Surgeon: Arvella Nigh, MD;  Location: [place];  Service: Gynecology;  Laterality: Bilateral;  need bedL;  CONVERTED TO OPEN ABDOMINAL HYSTERECTOMY AT 0840   LASIK     REDUCTION MAMMAPLASTY Bilateral 2014   TRIGGER FINGER RELEASE     2019   TRIGGER FINGER RELEASE Right 2020   R middle finger   uterine ablation     02-03-2018    Social History: Social History  Socioeconomic History   Marital status: Married    Spouse name: Not on file   Number of children: Not on file   Years of education: Not on file   Highest education level: Not on file  Occupational History   Not on file  Tobacco Use   Smoking status: Never   Smokeless tobacco: Never  Vaping Use   Vaping Use: Never used  Substance and Sexual Activity   Alcohol use: Yes    Comment: rarely  maybe 3 times a year   Drug use: Never   Sexual activity: Yes  Other Topics Concern   Not on file  Social History Narrative   Not on file   Social Determinants of Health   Financial Resource Strain: Not on file  Food Insecurity: Not on file  Transportation Needs: Not on file  Physical Activity: Not on file  Stress: Not on file  Social Connections: Not on file  Intimate Partner Violence: Not on file    Family History: Family History  Problem Relation Age of Onset   COPD Mother    Hypertension Mother    Depression Mother    Diabetes Mother    Early death Mother    Hyperlipidemia Mother    Anxiety disorder Mother    Sleep apnea Mother    Heart attack Father    Alcohol abuse Father    Early death Father    Mental illness Father    Sudden death Father    Bipolar disorder Father    Drug abuse  Father    Alcohol abuse Brother    Mental illness Brother    Alcohol abuse Maternal Grandmother    COPD Maternal Grandmother    Alcohol abuse Maternal Grandfather    Cancer Maternal Grandfather    COPD Maternal Grandfather    Diabetes Paternal Grandmother    Alcohol abuse Paternal Grandfather    Cancer Other    COPD Other    Hypertension Other    Hyperlipidemia Other    Diabetes Other    Breast cancer Neg Hx     Review of Systems: Review of Systems  Constitutional: Negative.   Cardiovascular: Negative.   Gastrointestinal: Negative.   Musculoskeletal:  Positive for back pain.  Skin:  Positive for rash.  Neurological: Negative.     Physical Exam: Vital Signs BP (!) 142/83 (BP Location: Right Arm, Patient Position: Sitting, Cuff Size: Large)   Pulse 80   LMP 09/14/2018 (Exact Date)   SpO2 99%   Physical Exam Constitutional:      General: Not in acute distress.    Appearance: Normal appearance. Not ill-appearing.  HENT:     Head: Normocephalic and atraumatic.  Eyes:     Pupils: Pupils are equal, round Neck:     Musculoskeletal: Normal range of motion.  Cardiovascular:     Rate and Rhythm: Normal rate    Pulses: Normal pulses.  Pulmonary:     Effort: Pulmonary effort is normal. No respiratory distress.  Abdominal:     General: Abdomen is flat. There is no distension.  Musculoskeletal: Normal range of motion.  Skin:    General: Skin is warm and dry.     Findings: No erythema or rash.  Neurological:     General: No focal deficit present.     Mental Status: Alert and oriented to person, place, and time. Mental status is at baseline.     Motor: No weakness.  Psychiatric:        Mood  and Affect: Mood normal.        Behavior: Behavior normal.    Assessment/Plan: The patient is scheduled for panniculectomy with Dr. Marla Roe.  Risks, benefits, and alternatives of procedure discussed, questions answered and consent obtained.    Smoking Status: Non-smoker;  Counseling Given?  N/A  Caprini Score: 6, high; Risk Factors include: Age, varicose veins, history of greater than 3 miscarriages BMI > 25, and length of planned surgery. Recommendation for mechanical prophylaxis. Encourage early ambulation.   Pictures obtained: @consult$   Post-op Rx sent to pharmacy: Oxycodone, Zofran, Bactrim  Patient was provided with the General Surgical Risk consent document and Pain Medication Agreement prior to their appointment.  They had adequate time to read through the risk consent documents and Pain Medication Agreement. We also discussed them in person together during this preop appointment. All of their questions were answered to their satisfaction.  Recommended calling if they have any further questions.  Risk consent form and Pain Medication Agreement to be scanned into patient's chart.  The risk that can be encountered for this procedure were discussed and include the following but not limited to these: asymmetry, fluid accumulation, firmness of the tissue, skin loss, decrease or no sensation, fat necrosis, bleeding, infection, healing delay.  Deep vein thrombosis, cardiac and pulmonary complications are risks to any procedure.  There are risks of anesthesia, changes to skin sensation and injury to nerves or blood vessels.  The muscle can be temporarily or permanently injured.  You may have an allergic reaction to tape, suture, glue, blood products which can result in skin discoloration, swelling, pain, skin lesions, poor healing.  Any of these can lead to the need for revisonal surgery or stage procedures.  Weight gain and weigh loss can also effect the long term appearance. The results are not guaranteed to last a lifetime.  Future surgery may be required.      Electronically signed by: Carola Rhine Palma Buster, PA-C 12/25/2022 3:53 PM

## 2022-12-25 NOTE — Progress Notes (Signed)
   Patient ID: Margaret [name], female    DOB: 01/10/1976, 47 y.o.   MRN: [id number]  Chief Complaint  Patient presents with   Pre-op Exam      ICD-10-CM   1. Panniculitis  M79.3     2. Chronic bilateral low back pain, unspecified whether sciatica present  M54.50    G89.29     3. Pre-diabetes  R73.03       History of Present Illness: Margaret [name] is a 47 y.o.  female  with a history of abdominal pannus.  She presents for preoperative evaluation for upcoming procedure, panniculectomy, scheduled for 01/09/2023 with Dr. Dillingham.  The patient has not had problems with anesthesia, but does report some postoperative nausea. No history of DVT/PE.  No family history of DVT/PE.  No family or personal history of bleeding or clotting disorders.  Patient is not currently taking any blood thinners.  No history of CVA/MI.  She does have a history of approximately 6 miscarriages -thought to be related to gluteal phase abnormalities.  Summary of Previous Visit: Patient with history of abdominal pannus -back pain and recurrent infra pannus rashes.  History of C-section and hysterectomy.  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  Will plan for 6 weeks out of work.  PMH Significant for: Prediabetes -most recent A1c 6.26 May 2022, hyperlipidemia, history of heart murmur (she reports that it was a slight murmur noted while she was pregnant, undetectable outside of pregnancy)  She has a history of seasonal/exercise-induced asthma, has not used her inhaler for quite some time.  Reports it is related to the summer and pollen season.  She has a history of PVCs, reports they are benign, she has had a Holter monitor for this and reports that she has no other cardiac abnormalities at this time.  Past Medical History: Allergies: Allergies  Allergen Reactions   Dilaudid [Hydromorphone] Itching   Amoxicillin Hives and Swelling    Has patient had a PCN reaction causing immediate rash,  facial/tongue/throat swelling, SOB or lightheadedness with hypotension: Yes Has patient had a PCN reaction causing severe rash involving mucus membranes or skin necrosis: No Has patient had a PCN reaction that required hospitalization: No Has patient had a PCN reaction occurring within the last 10 years: Yes If all of the above answers are "NO", then may proceed with Cephalosporin use.   Ancef [Cefazolin] Hives   Penicillins Hives and Swelling    Has patient had a PCN reaction causing immediate rash, facial/tongue/throat swelling, SOB or lightheadedness with hypotension: Yes Has patient had a PCN reaction causing severe rash involving mucus membranes or skin necrosis: No Has patient had a PCN reaction that required hospitalization: No Has patient had a PCN reaction occurring within the last 10 years: Yes If all of the above answers are "NO", then may proceed with Cephalosporin use.   Adhesive [Tape] Other (See Comments)    SKIN IRRITATION   Keflex [Cephalexin] Rash    Current Medications:  Current Outpatient Medications:    buPROPion (WELLBUTRIN XL) 300 MG 24 hr tablet, TAKE 1 TABLET DAILY, Disp: 90 tablet, Rfl: 0   cholecalciferol (VITAMIN D3) 25 MCG (1000 UNIT) tablet, Take 1,000 Units by mouth daily., Disp: , Rfl:    docusate sodium (COLACE) 100 MG capsule, Take 100 mg by mouth 2 (two) times daily., Disp: , Rfl:    ferrous sulfate 324 (65 Fe) MG TBEC, Take by mouth., Disp: , Rfl:      ibuprofen (ADVIL,MOTRIN) 200 MG tablet, Take 600 mg by mouth every 6 (six) hours as needed for headache or mild pain., Disp: , Rfl:    metFORMIN (GLUCOPHAGE) 500 MG tablet, Take 1 tablet (500 mg total) by mouth 2 (two) times daily with a meal., Disp: 180 tablet, Rfl: 0   nystatin (MYCOSTATIN/NYSTOP) powder, Apply topically 4 times daily., Disp: 60 g, Rfl: 0   ondansetron (ZOFRAN) 4 MG tablet, Take 1 tablet (4 mg total) by mouth every 8 (eight) hours as needed for nausea or vomiting., Disp: 20 tablet, Rfl:  0   oxybutynin (DITROPAN-XL) 10 MG 24 hr tablet, TAKE 1 TABLET AT BEDTIME, Disp: 90 tablet, Rfl: 3   oxyCODONE (OXY IR/ROXICODONE) 5 MG immediate release tablet, Take 1 tablet (5 mg total) by mouth every 6 (six) hours as needed for up to 3 days for severe pain., Disp: 12 tablet, Rfl: 0   sulfamethoxazole-trimethoprim (BACTRIM DS) 800-160 MG tablet, Take 1 tablet by mouth 2 (two) times daily for 3 days., Disp: 6 tablet, Rfl: 0   valACYclovir (VALTREX) 500 MG tablet, TAKE 1 TABLET (500 MG TOTAL) BY MOUTH 2 (TWO) TIMES DAILY. FOR 3 DAYS, Disp: 30 tablet, Rfl: 1  Past Medical Problems: Past Medical History:  Diagnosis Date   Abrasion of right thumb 04/19/2015   Anxiety    Asthma    Back pain    Carpal tunnel syndrome on both sides 03/2015   Complication of anesthesia    Dental crown present    Diabetes mellitus without complication (HCC)    Exercise-induced asthma    prn inhaler  seasonal   Fatigue    Headache associated with hormonal factors    Heart burn    Heart murmur    slight mummur   History of endometrial ablation    History of partial hysterectomy 2019   Hyperlipidemia    Infertility, female    Low serum iron    no current med.   Obesity    Palpitations    PONV (postoperative nausea and vomiting)    nausea only   Pre-diabetes    PVCs (premature ventricular contractions)    states are benign   Seasonal allergies    SOB (shortness of breath)    Trigger finger     Past Surgical History: Past Surgical History:  Procedure Laterality Date   ABDOMINAL HYSTERECTOMY     Laparoscopic assisted Dr. John Mccomb 09-22-18   BREAST REDUCTION SURGERY Bilateral 10/27/2013   Procedure: MAMMARY REDUCTION  (BREAST) BILATERAL;  Surgeon: Howard Holderness, MD;  Location: [facility];  Service: Plastics;  Laterality: Bilateral;   CARPAL TUNNEL RELEASE Bilateral 04/22/2015   Procedure: BILATERAL CARPAL TUNNEL RELEASE;  Surgeon: William Gramig, MD;  Location: [facility];  Service: Orthopedics;  Laterality: Bilateral;   CESAREAN SECTION  04/23/2002   twins   CESAREAN SECTION  03/28/2005   DILATION AND CURETTAGE OF UTERUS     LAPAROSCOPIC VAGINAL HYSTERECTOMY WITH SALPINGECTOMY Bilateral 09/22/2018   Procedure: ABDOMINAL HYSTERECTOMY WITH BILATERAL SALPINGECTOMY;  Surgeon: McComb, John, MD;  Location: [facility];  Service: Gynecology;  Laterality: Bilateral;  need bedL;  CONVERTED TO OPEN ABDOMINAL HYSTERECTOMY AT 0840   LASIK     REDUCTION MAMMAPLASTY Bilateral 2014   TRIGGER FINGER RELEASE     2019   TRIGGER FINGER RELEASE Right 2020   R middle finger   uterine ablation     02-03-2018    Social History: Social History     Socioeconomic History   Marital status: Married    Spouse name: Not on file   Number of children: Not on file   Years of education: Not on file   Highest education level: Not on file  Occupational History   Not on file  Tobacco Use   Smoking status: Never   Smokeless tobacco: Never  Vaping Use   Vaping Use: Never used  Substance and Sexual Activity   Alcohol use: Yes    Comment: rarely  maybe 3 times a year   Drug use: Never   Sexual activity: Yes  Other Topics Concern   Not on file  Social History Narrative   Not on file   Social Determinants of Health   Financial Resource Strain: Not on file  Food Insecurity: Not on file  Transportation Needs: Not on file  Physical Activity: Not on file  Stress: Not on file  Social Connections: Not on file  Intimate Partner Violence: Not on file    Family History: Family History  Problem Relation Age of Onset   COPD Mother    Hypertension Mother    Depression Mother    Diabetes Mother    Early death Mother    Hyperlipidemia Mother    Anxiety disorder Mother    Sleep apnea Mother    Heart attack Father    Alcohol abuse Father    Early death Father    Mental illness Father    Sudden death Father    Bipolar disorder Father    Drug abuse  Father    Alcohol abuse Brother    Mental illness Brother    Alcohol abuse Maternal Grandmother    COPD Maternal Grandmother    Alcohol abuse Maternal Grandfather    Cancer Maternal Grandfather    COPD Maternal Grandfather    Diabetes Paternal Grandmother    Alcohol abuse Paternal Grandfather    Cancer Other    COPD Other    Hypertension Other    Hyperlipidemia Other    Diabetes Other    Breast cancer Neg Hx     Review of Systems: Review of Systems  Constitutional: Negative.   Cardiovascular: Negative.   Gastrointestinal: Negative.   Musculoskeletal:  Positive for back pain.  Skin:  Positive for rash.  Neurological: Negative.     Physical Exam: Vital Signs BP (!) 142/83 (BP Location: Right Arm, Patient Position: Sitting, Cuff Size: Large)   Pulse 80   LMP 09/14/2018 (Exact Date)   SpO2 99%   Physical Exam Constitutional:      General: Not in acute distress.    Appearance: Normal appearance. Not ill-appearing.  HENT:     Head: Normocephalic and atraumatic.  Eyes:     Pupils: Pupils are equal, round Neck:     Musculoskeletal: Normal range of motion.  Cardiovascular:     Rate and Rhythm: Normal rate    Pulses: Normal pulses.  Pulmonary:     Effort: Pulmonary effort is normal. No respiratory distress.  Abdominal:     General: Abdomen is flat. There is no distension.  Musculoskeletal: Normal range of motion.  Skin:    General: Skin is warm and dry.     Findings: No erythema or rash.  Neurological:     General: No focal deficit present.     Mental Status: Alert and oriented to person, place, and time. Mental status is at baseline.     Motor: No weakness.  Psychiatric:        Mood   and Affect: Mood normal.        Behavior: Behavior normal.    Assessment/Plan: The patient is scheduled for panniculectomy with Dr. Dillingham.  Risks, benefits, and alternatives of procedure discussed, questions answered and consent obtained.    Smoking Status: Non-smoker;  Counseling Given?  N/A  Caprini Score: 6, high; Risk Factors include: Age, varicose veins, history of greater than 3 miscarriages BMI > 25, and length of planned surgery. Recommendation for mechanical prophylaxis. Encourage early ambulation.   Pictures obtained: @consult  Post-op Rx sent to pharmacy: Oxycodone, Zofran, Bactrim  Patient was provided with the General Surgical Risk consent document and Pain Medication Agreement prior to their appointment.  They had adequate time to read through the risk consent documents and Pain Medication Agreement. We also discussed them in person together during this preop appointment. All of their questions were answered to their satisfaction.  Recommended calling if they have any further questions.  Risk consent form and Pain Medication Agreement to be scanned into patient's chart.  The risk that can be encountered for this procedure were discussed and include the following but not limited to these: asymmetry, fluid accumulation, firmness of the tissue, skin loss, decrease or no sensation, fat necrosis, bleeding, infection, healing delay.  Deep vein thrombosis, cardiac and pulmonary complications are risks to any procedure.  There are risks of anesthesia, changes to skin sensation and injury to nerves or blood vessels.  The muscle can be temporarily or permanently injured.  You may have an allergic reaction to tape, suture, glue, blood products which can result in skin discoloration, swelling, pain, skin lesions, poor healing.  Any of these can lead to the need for revisonal surgery or stage procedures.  Weight gain and weigh loss can also effect the long term appearance. The results are not guaranteed to last a lifetime.  Future surgery may be required.      Electronically signed by: Preeti Winegardner J Pegge Cumberledge, PA-C 12/25/2022 3:53 PM 

## 2022-12-26 ENCOUNTER — Other Ambulatory Visit (HOSPITAL_COMMUNITY): Payer: Self-pay

## 2022-12-27 ENCOUNTER — Telehealth: Payer: Self-pay | Admitting: Plastic Surgery

## 2022-12-27 DIAGNOSIS — Z719 Counseling, unspecified: Secondary | ICD-10-CM

## 2022-12-27 NOTE — Telephone Encounter (Signed)
Called pt and let her know we did receive FMLA papers from [employer] and she paid $25.00 fee. Will fax to [employer] once complete.

## 2022-12-28 ENCOUNTER — Encounter: Payer: Self-pay | Admitting: Pediatric Intensive Care

## 2022-12-28 ENCOUNTER — Ambulatory Visit (INDEPENDENT_AMBULATORY_CARE_PROVIDER_SITE_OTHER): Payer: BC Managed Care – PPO | Admitting: Physician Assistant

## 2022-12-28 ENCOUNTER — Other Ambulatory Visit (HOSPITAL_COMMUNITY): Payer: Self-pay

## 2022-12-28 ENCOUNTER — Encounter: Payer: Self-pay | Admitting: Physician Assistant

## 2022-12-28 VITALS — BP 116/72 | HR 82 | Temp 98.0°F | Ht 63.0 in | Wt 188.0 lb

## 2022-12-28 DIAGNOSIS — K644 Residual hemorrhoidal skin tags: Secondary | ICD-10-CM | POA: Diagnosis not present

## 2022-12-28 DIAGNOSIS — K5909 Other constipation: Secondary | ICD-10-CM | POA: Diagnosis not present

## 2022-12-28 DIAGNOSIS — D508 Other iron deficiency anemias: Secondary | ICD-10-CM | POA: Diagnosis not present

## 2022-12-28 DIAGNOSIS — N3281 Overactive bladder: Secondary | ICD-10-CM | POA: Insufficient documentation

## 2022-12-28 DIAGNOSIS — E782 Mixed hyperlipidemia: Secondary | ICD-10-CM | POA: Insufficient documentation

## 2022-12-28 DIAGNOSIS — E538 Deficiency of other specified B group vitamins: Secondary | ICD-10-CM | POA: Diagnosis not present

## 2022-12-28 DIAGNOSIS — D649 Anemia, unspecified: Secondary | ICD-10-CM | POA: Insufficient documentation

## 2022-12-28 DIAGNOSIS — R7303 Prediabetes: Secondary | ICD-10-CM | POA: Diagnosis not present

## 2022-12-28 DIAGNOSIS — E559 Vitamin D deficiency, unspecified: Secondary | ICD-10-CM

## 2022-12-28 LAB — TSH: TSH: 1.59 u[IU]/mL (ref 0.35–5.50)

## 2022-12-28 LAB — CBC WITH DIFFERENTIAL/PLATELET
Basophils Absolute: 0 10*3/uL (ref 0.0–0.1)
Basophils Relative: 0.3 % (ref 0.0–3.0)
Eosinophils Absolute: 0.1 10*3/uL (ref 0.0–0.7)
Eosinophils Relative: 0.7 % (ref 0.0–5.0)
HCT: 39.3 % (ref 36.0–46.0)
Hemoglobin: 13.1 g/dL (ref 12.0–15.0)
Lymphocytes Relative: 42.8 % (ref 12.0–46.0)
Lymphs Abs: 3.5 10*3/uL (ref 0.7–4.0)
MCHC: 33.2 g/dL (ref 30.0–36.0)
MCV: 84.7 fl (ref 78.0–100.0)
Monocytes Absolute: 0.4 10*3/uL (ref 0.1–1.0)
Monocytes Relative: 5.3 % (ref 3.0–12.0)
Neutro Abs: 4.1 10*3/uL (ref 1.4–7.7)
Neutrophils Relative %: 50.9 % (ref 43.0–77.0)
Platelets: 297 10*3/uL (ref 150.0–400.0)
RBC: 4.64 Mil/uL (ref 3.87–5.11)
RDW: 14.8 % (ref 11.5–15.5)
WBC: 8.1 10*3/uL (ref 4.0–10.5)

## 2022-12-28 LAB — COMPREHENSIVE METABOLIC PANEL
ALT: 12 U/L (ref 0–35)
AST: 9 U/L (ref 0–37)
Albumin: 4.6 g/dL (ref 3.5–5.2)
Alkaline Phosphatase: 39 U/L (ref 39–117)
BUN: 11 mg/dL (ref 6–23)
CO2: 25 mEq/L (ref 19–32)
Calcium: 9.2 mg/dL (ref 8.4–10.5)
Chloride: 105 mEq/L (ref 96–112)
Creatinine, Ser: 0.71 mg/dL (ref 0.40–1.20)
GFR: 100.21 mL/min (ref >60.00)
Glucose, Bld: 92 mg/dL (ref 70–99)
Potassium: 4.4 mEq/L (ref 3.5–5.1)
Sodium: 140 mEq/L (ref 135–145)
Total Bilirubin: 0.5 mg/dL (ref 0.2–1.2)
Total Protein: 7 g/dL (ref 6.0–8.3)

## 2022-12-28 LAB — VITAMIN B12: Vitamin B-12: 265 pg/mL (ref 211–911)

## 2022-12-28 LAB — VITAMIN D 25 HYDROXY (VIT D DEFICIENCY, FRACTURES): VITD: 30.09 ng/mL (ref 30.00–100.00)

## 2022-12-28 LAB — LIPID PANEL
Cholesterol: 188 mg/dL (ref 0–200)
HDL: 48 mg/dL (ref >39.00)
LDL Cholesterol: 120 mg/dL — ABNORMAL HIGH (ref 0–99)
NonHDL: 140.28
Total CHOL/HDL Ratio: 4
Triglycerides: 102 mg/dL (ref 0.0–149.0)
VLDL: 20.4 mg/dL (ref 0.0–40.0)

## 2022-12-28 LAB — HEMOGLOBIN A1C: Hgb A1c MFr Bld: 6 % (ref 4.6–6.5)

## 2022-12-28 NOTE — Progress Notes (Unsigned)
Subjective:    Patient ID: Margaret [name], female    DOB: 08/30/1976, 47 y.o.   MRN: [id number]  Chief Complaint  Patient presents with   Follow-up    Pt in office for fasting labs and follow up; pt is fasting, pt thinks she may need GI consult for Thrombosis hemorrhoid.    HPI Patient is in today for regular recheck. Needing fasting labs checked today. No hospitalizations or major medical changes since last visit.   Feb 14 - scheduled for panniculectomy   Chronic constipation - occasionally has hemorrhoids that flare up. Fiber supplement, senokot, colace daily, prebiotic/probiotic, sitz baths; no pain currently. Using hemorrhoid creams and suppositories.   Past Medical History:  Diagnosis Date   Abrasion of right thumb 04/19/2015   Anxiety    Asthma    Back pain    Carpal tunnel syndrome on both sides 93/5701   Complication of anesthesia    Dental crown present    Diabetes mellitus without complication (HCC)    Exercise-induced asthma    prn inhaler  seasonal   Fatigue    Headache associated with hormonal factors    Heart burn    Heart murmur    slight mummur   History of endometrial ablation    History of partial hysterectomy 2019   Hyperlipidemia    Infertility, female    Low serum iron    no current med.   Obesity    Palpitations    PONV (postoperative nausea and vomiting)    nausea only   Pre-diabetes    PVCs (premature ventricular contractions)    states are benign   Seasonal allergies    SOB (shortness of breath)    Trigger finger     Past Surgical History:  Procedure Laterality Date   ABDOMINAL HYSTERECTOMY     Laparoscopic assisted Dr. Arvella Nigh 09-22-18   BREAST REDUCTION SURGERY Bilateral 10/27/2013   Procedure: MAMMARY REDUCTION  (BREAST) BILATERAL;  Surgeon: Erline Hau, MD;  Location: [place];  Service: Plastics;  Laterality: Bilateral;   CARPAL TUNNEL RELEASE Bilateral 04/22/2015   Procedure: BILATERAL CARPAL TUNNEL  RELEASE;  Surgeon: Roseanne Kaufman, MD;  Location: [place];  Service: Orthopedics;  Laterality: Bilateral;   CESAREAN SECTION  04/23/2002   twins   CESAREAN SECTION  03/28/2005   DILATION AND CURETTAGE OF UTERUS     LAPAROSCOPIC VAGINAL HYSTERECTOMY WITH SALPINGECTOMY Bilateral 09/22/2018   Procedure: ABDOMINAL HYSTERECTOMY WITH BILATERAL SALPINGECTOMY;  Surgeon: Arvella Nigh, MD;  Location: [place];  Service: Gynecology;  Laterality: Bilateral;  need bedL;  CONVERTED TO OPEN ABDOMINAL HYSTERECTOMY AT 0840   LASIK     REDUCTION MAMMAPLASTY Bilateral 2014   TRIGGER FINGER RELEASE     2019   TRIGGER FINGER RELEASE Right 2020   R middle finger   uterine ablation     02-03-2018    Family History  Problem Relation Age of Onset   COPD Mother    Hypertension Mother    Depression Mother    Diabetes Mother    Early death Mother    Hyperlipidemia Mother    Anxiety disorder Mother    Sleep apnea Mother    Heart attack Father    Alcohol abuse Father    Early death Father    Mental illness Father    Sudden death Father    Bipolar disorder Father    Drug abuse Father    Alcohol abuse Brother  Mental illness Brother    Alcohol abuse Maternal Grandmother    COPD Maternal Grandmother    Alcohol abuse Maternal Grandfather    Cancer Maternal Grandfather    COPD Maternal Grandfather    Diabetes Paternal Grandmother    Alcohol abuse Paternal Grandfather    Cancer Other    COPD Other    Hypertension Other    Hyperlipidemia Other    Diabetes Other    Breast cancer Neg Hx     Social History   Tobacco Use   Smoking status: Never   Smokeless tobacco: Never  Vaping Use   Vaping Use: Never used  Substance Use Topics   Alcohol use: Yes    Comment: rarely  maybe 3 times a year   Drug use: Never     Allergies  Allergen Reactions   Dilaudid [Hydromorphone] Itching   Amoxicillin Hives and Swelling    Has patient had a PCN reaction causing immediate  rash, facial/tongue/throat swelling, SOB or lightheadedness with hypotension: Yes Has patient had a PCN reaction causing severe rash involving mucus membranes or skin necrosis: No Has patient had a PCN reaction that required hospitalization: No Has patient had a PCN reaction occurring within the last 10 years: Yes If all of the above answers are "NO", then may proceed with Cephalosporin use.   Ancef [Cefazolin] Hives   Penicillins Hives and Swelling    Has patient had a PCN reaction causing immediate rash, facial/tongue/throat swelling, SOB or lightheadedness with hypotension: Yes Has patient had a PCN reaction causing severe rash involving mucus membranes or skin necrosis: No Has patient had a PCN reaction that required hospitalization: No Has patient had a PCN reaction occurring within the last 10 years: Yes If all of the above answers are "NO", then may proceed with Cephalosporin use.   Adhesive [Tape] Other (See Comments)    SKIN IRRITATION   Keflex [Cephalexin] Rash    Review of Systems NEGATIVE UNLESS OTHERWISE INDICATED IN HPI      Objective:     BP 116/72 (BP Location: Left Arm)   Pulse 82   Temp 98 F (36.7 C) (Temporal)   Ht '5\' 3"'$  (1.6 m)   Wt 188 lb (85.3 kg)   LMP 09/14/2018 (Exact Date)   SpO2 98%   BMI 33.30 kg/m   Wt Readings from Last 3 Encounters:  12/28/22 188 lb (85.3 kg)  12/18/22 186 lb (84.4 kg)  10/30/22 194 lb (88 kg)    BP Readings from Last 3 Encounters:  12/28/22 116/72  12/28/22 (!) 140/83  12/25/22 (!) 142/83     Physical Exam Vitals and nursing note reviewed.  Constitutional:      Appearance: Normal appearance. She is obese.  Cardiovascular:     Rate and Rhythm: Normal rate and regular rhythm.     Pulses: Normal pulses.     Heart sounds: No murmur heard. Pulmonary:     Effort: Pulmonary effort is normal.     Breath sounds: Normal breath sounds.  Neurological:     General: No focal deficit present.     Mental Status: She is  alert and oriented to person, place, and time.  Psychiatric:        Mood and Affect: Mood normal.        Behavior: Behavior normal.        Assessment & Plan:  Chronic constipation -     Ambulatory referral to Gastroenterology -     Vitamin B12 -  TSH -     Comprehensive metabolic panel -     CBC with Differential/Platelet  External hemorrhoid -     Ambulatory referral to Gastroenterology  Prediabetes Assessment & Plan: Lab Results  Component Value Date   HGBA1C 6.0 12/28/2022   Continue Metformin 500 mg BID Continue work on lifestyle   Orders: -     Comprehensive metabolic panel -     Hemoglobin A1c  Other iron deficiency anemia -     CBC with Differential/Platelet  Vitamin B 12 deficiency -     Vitamin B12  Vitamin D deficiency -     VITAMIN D 25 Hydroxy (Vit-D Deficiency, Fractures)  Mixed hyperlipidemia Assessment & Plan: Labs today Cont working on lifestyle The 10-year ASCVD risk score (Arnett DK, et al., 2019) is: 0.9%   Values used to calculate the score:     Age: 62 years     Sex: Female     Is Non-Hispanic African American: No     Diabetic: No     Tobacco smoker: No     Systolic Blood Pressure: 170 mmHg     Is BP treated: No     HDL Cholesterol: 48 mg/dL     Total Cholesterol: 188 mg/dL   Orders: -     Lipid panel  Overactive bladder Assessment & Plan: Oxybutynin 10 mg daily Pt aware of risks vs benefits and possible adverse reactions          Return in about 1 year (around 12/29/2023) for CPE, fasting labs .    Kristian Hazzard M Shaunna Rosetti, PA-C

## 2023-01-01 ENCOUNTER — Encounter (HOSPITAL_BASED_OUTPATIENT_CLINIC_OR_DEPARTMENT_OTHER): Payer: Self-pay | Admitting: Plastic Surgery

## 2023-01-01 ENCOUNTER — Other Ambulatory Visit: Payer: Self-pay

## 2023-01-02 NOTE — Progress Notes (Unsigned)
Chief Complaint:   OBESITY Margaret [name] is here to discuss her progress with her obesity treatment plan along with follow-up of her obesity related diagnoses. Margaret [name] is on the Category 2 Plan and states she is following her eating plan approximately 70% of the time. Margaret [name] states she is doing 0 minutes 0 times per week.  Today's visit was #: 7 Starting weight: 217 lbs Starting date: 06/14/2022 Today's weight: 186 lbs Today's date: 12/18/2022 Total lbs lost to date: 31 Total lbs lost since last in-office visit: 8  Interim History: Kimbery has done well with weight loss even over the holidays.  She is working on portion control and increasing protein and vegetables.  She is deviating from her plan more, but she is still mindful.  Subjective:   1. Candidiasis, cutaneous Margaret [name] has further questions about her upcoming panniculectomy, which she is having done due to recent infections.  2. Prediabetes Margaret [name] continues to work on her diet and weight loss, and she has done well.  She increase her metformin to twice daily.  3. Other constipation Margaret [name] has been taking pre and probiotics daily with minimal benefit.  She is still having bowel movements every 3 to 4 days.  She takes Colace as needed.  Assessment/Plan:   1. Candidiasis, cutaneous All questions answered today.  Margaret [name] appears to be a good candidate and I think she will do well.  2. Prediabetes Margaret [name] agreed to increase metformin to 500 mg twice daily, and we will refill for 90 days.  - metFORMIN (GLUCOPHAGE) 500 MG tablet; Take 1 tablet (500 mg total) by mouth 2 (two) times daily with a meal.  Dispense: 180 tablet; Refill: 0  3. Other constipation Margaret [name] agreed to increase Colace to daily and increase probiotic to twice daily.  4. BMI 33.0-33.9,adult  5. Obesity, Beginning BMI 38.44 Margaret [name] is currently in the action stage of change. As such, her goal is to continue with weight loss efforts. She has agreed to the Category 2  Plan.   Behavioral modification strategies: increasing lean protein intake.  Margaret [name] has agreed to follow-up with our clinic in 6 to 8 weeks. She was informed of the importance of frequent follow-up visits to maximize her success with intensive lifestyle modifications for her multiple health conditions.   Objective:   Blood pressure 107/68, pulse 70, temperature 97.9 F (36.6 C), height '5\' 3"'$  (1.6 m), weight 186 lb (84.4 kg), last menstrual period 09/14/2018, SpO2 98 %. Body mass index is 32.95 kg/m.  General: Cooperative, alert, well developed, in no acute distress. HEENT: Conjunctivae and lids unremarkable. Cardiovascular: Regular rhythm.  Lungs: Normal work of breathing. Neurologic: No focal deficits.   Lab Results  Component Value Date   CREATININE 0.71 12/28/2022   BUN 11 12/28/2022   NA 140 12/28/2022   K 4.4 12/28/2022   CL 105 12/28/2022   CO2 25 12/28/2022   Lab Results  Component Value Date   ALT 12 12/28/2022   AST 9 12/28/2022   ALKPHOS 39 12/28/2022   BILITOT 0.5 12/28/2022   Lab Results  Component Value Date   HGBA1C 6.0 12/28/2022   HGBA1C 6.1 (H) 06/14/2022   HGBA1C 6.3 01/05/2022   HGBA1C 6.4 07/05/2021   HGBA1C 6.0 12/01/2020   Lab Results  Component Value Date   INSULIN 17.6 06/14/2022   Lab Results  Component Value Date   TSH 1.59 12/28/2022   Lab Results  Component Value Date   CHOL 188 12/28/2022   HDL 48.00  12/28/2022   LDLCALC 120 (H) 12/28/2022   TRIG 102.0 12/28/2022   CHOLHDL 4 12/28/2022   Lab Results  Component Value Date   VD25OH 30.09 12/28/2022   VD25OH 32.2 06/14/2022   VD25OH 11.98 (L) 01/05/2022   Lab Results  Component Value Date   WBC 8.1 12/28/2022   HGB 13.1 12/28/2022   HCT 39.3 12/28/2022   MCV 84.7 12/28/2022   PLT 297.0 12/28/2022   Lab Results  Component Value Date   IRON 36 (L) 10/13/2014   TIBC 430 10/13/2014   FERRITIN 10 10/13/2014   Attestation Statements:   Reviewed by clinician on day of  visit: allergies, medications, problem list, medical history, surgical history, family history, social history, and previous encounter notes.  Time spent on visit including pre-visit chart review and post-visit care and charting was 42 minutes.   I, Trixie Dredge, am acting as transcriptionist for Dennard Nip, MD.  I have reviewed the above documentation for accuracy and completeness, and I agree with the above. -  Dennard Nip, MD

## 2023-01-03 NOTE — Assessment & Plan Note (Signed)
Lab Results  Component Value Date   HGBA1C 6.0 12/28/2022   Continue Metformin 500 mg BID Continue work on lifestyle

## 2023-01-03 NOTE — Assessment & Plan Note (Signed)
Labs today Cont working on lifestyle The 10-year ASCVD risk score (Arnett DK, et al., 2019) is: 0.9%   Values used to calculate the score:     Age: 47 years     Sex: Female     Is Non-Hispanic African American: No     Diabetic: No     Tobacco smoker: No     Systolic Blood Pressure: 072 mmHg     Is BP treated: No     HDL Cholesterol: 48 mg/dL     Total Cholesterol: 188 mg/dL

## 2023-01-03 NOTE — Assessment & Plan Note (Signed)
Oxybutynin 10 mg daily Pt aware of risks vs benefits and possible adverse reactions

## 2023-01-07 ENCOUNTER — Encounter: Payer: Self-pay | Admitting: *Deleted

## 2023-01-07 NOTE — Progress Notes (Signed)
Pt seen by PCP shortly after 12/28/22 [facility] event, same day and b/p was 116/72. Pt followed by PCP, [facility], and other specialists as needed on regular basis. No SDOH barriers noted by pt. No additional health equity team support indicated at this time.

## 2023-01-08 NOTE — Anesthesia Preprocedure Evaluation (Signed)
Anesthesia Evaluation  Patient identified by MRN, date of birth, ID band Patient awake    Reviewed: Allergy & Precautions, NPO status , Patient's Chart, lab work & pertinent test results  History of Anesthesia Complications (+) PONV and history of anesthetic complications  Airway Mallampati: I  TM Distance: >3 FB Neck ROM: Full   Comment: Previous grade I view with MAC 3, easy mask Dental  (+) Teeth Intact, Dental Advisory Given,    Pulmonary neg shortness of breath, asthma (exercise-induced) , neg sleep apnea, neg COPD, neg recent URI, neg PE   Pulmonary exam normal breath sounds clear to auscultation       Cardiovascular (-) hypertension(-) angina (-) Past MI and (-) Cardiac Stents + dysrhythmias (PVCs) + Valvular Problems/Murmurs (slight murmur noted while pregnant)  Rhythm:Regular Rate:Normal  HLD   Neuro/Psych  Headaches, neg Seizures PSYCHIATRIC DISORDERS Anxiety Depression Bipolar Disorder      GI/Hepatic Neg liver ROS,GERD  ,,  Endo/Other  Diabetes: Hgb A1c 6.0.  Pre-diabetes, takes metformin for weight loss  Renal/GU negative Renal ROS     Musculoskeletal   Abdominal  (+) + obese  Peds  Hematology  (+) Blood dyscrasia, anemia   Anesthesia Other Findings   Reproductive/Obstetrics                              Anesthesia Physical Anesthesia Plan  ASA: 2  Anesthesia Plan: General   Post-op Pain Management:    Induction: Intravenous  PONV Risk Score and Plan: 4 or greater and Ondansetron, Dexamethasone, Midazolam, Scopolamine patch - Pre-op and Treatment may vary due to age or medical condition  Airway Management Planned: Oral ETT  Additional Equipment:   Intra-op Plan:   Post-operative Plan: Extubation in OR  Informed Consent: I have reviewed the patients History and Physical, chart, labs and discussed the procedure including the risks, benefits and alternatives for the  proposed anesthesia with the patient or authorized representative who has indicated his/her understanding and acceptance.     Dental advisory given  Plan Discussed with: CRNA and Anesthesiologist  Anesthesia Plan Comments: (Risks of general anesthesia discussed including, but not limited to, sore throat, hoarse voice, chipped/damaged teeth, injury to vocal cords, nausea and vomiting, allergic reactions, lung infection, heart attack, stroke, and death. All questions answered. )         Anesthesia Quick Evaluation

## 2023-01-09 ENCOUNTER — Other Ambulatory Visit: Payer: Self-pay

## 2023-01-09 ENCOUNTER — Ambulatory Visit (HOSPITAL_BASED_OUTPATIENT_CLINIC_OR_DEPARTMENT_OTHER): Payer: BC Managed Care – PPO | Admitting: Anesthesiology

## 2023-01-09 ENCOUNTER — Encounter (HOSPITAL_BASED_OUTPATIENT_CLINIC_OR_DEPARTMENT_OTHER): Payer: Self-pay | Admitting: Plastic Surgery

## 2023-01-09 ENCOUNTER — Encounter (HOSPITAL_BASED_OUTPATIENT_CLINIC_OR_DEPARTMENT_OTHER)
Admission: RE | Disposition: A | Discharge status: Home / Self Care | Payer: Self-pay | Source: Home / Self Care | Attending: Plastic Surgery

## 2023-01-09 ENCOUNTER — Ambulatory Visit (HOSPITAL_BASED_OUTPATIENT_CLINIC_OR_DEPARTMENT_OTHER)
Admission: RE | Admit: 2023-01-09 | Discharge: 2023-01-09 | Disposition: A | Discharge status: Home / Self Care | Payer: BC Managed Care – PPO | Attending: Plastic Surgery | Admitting: Plastic Surgery

## 2023-01-09 DIAGNOSIS — R7303 Prediabetes: Secondary | ICD-10-CM | POA: Diagnosis not present

## 2023-01-09 DIAGNOSIS — M545 Low back pain, unspecified: Secondary | ICD-10-CM | POA: Insufficient documentation

## 2023-01-09 DIAGNOSIS — D649 Anemia, unspecified: Secondary | ICD-10-CM | POA: Diagnosis not present

## 2023-01-09 DIAGNOSIS — F319 Bipolar disorder, unspecified: Secondary | ICD-10-CM | POA: Insufficient documentation

## 2023-01-09 DIAGNOSIS — J45990 Exercise induced bronchospasm: Secondary | ICD-10-CM | POA: Diagnosis not present

## 2023-01-09 DIAGNOSIS — M793 Panniculitis, unspecified: Secondary | ICD-10-CM | POA: Insufficient documentation

## 2023-01-09 DIAGNOSIS — F419 Anxiety disorder, unspecified: Secondary | ICD-10-CM | POA: Insufficient documentation

## 2023-01-09 DIAGNOSIS — G8929 Other chronic pain: Secondary | ICD-10-CM | POA: Diagnosis not present

## 2023-01-09 DIAGNOSIS — Z7984 Long term (current) use of oral hypoglycemic drugs: Secondary | ICD-10-CM | POA: Diagnosis not present

## 2023-01-09 HISTORY — PX: PANNICULECTOMY: SHX5360

## 2023-01-09 SURGERY — PANNICULECTOMY
Anesthesia: General | Site: Abdomen

## 2023-01-09 MED ORDER — LIDOCAINE 2% (20 MG/ML) 5 ML SYRINGE
INTRAMUSCULAR | Status: AC
  Filled 2023-01-09: qty 5

## 2023-01-09 MED ORDER — PROPOFOL 10 MG/ML IV BOLUS
INTRAVENOUS | Status: AC
  Filled 2023-01-09: qty 20

## 2023-01-09 MED ORDER — SODIUM CHLORIDE 0.9% FLUSH: 3.0000 mL | Freq: Two times a day (BID) | INTRAVENOUS | Status: DC

## 2023-01-09 MED ORDER — BUPIVACAINE LIPOSOME 1.3 % IJ SUSP
INTRAMUSCULAR | Status: AC
  Filled 2023-01-09: qty 20

## 2023-01-09 MED ORDER — LIDOCAINE-EPINEPHRINE 1 %-1:100000 IJ SOLN
INTRAMUSCULAR | Status: AC
  Filled 2023-01-09: qty 2

## 2023-01-09 MED ORDER — ACETAMINOPHEN 500 MG PO TABS
1000.0000 mg | ORAL_TABLET | Freq: Once | ORAL | Status: AC
  Administered 2023-01-09: 1000 mg via ORAL

## 2023-01-09 MED ORDER — ACETAMINOPHEN 500 MG PO TABS
ORAL_TABLET | ORAL | Status: AC
  Filled 2023-01-09: qty 2

## 2023-01-09 MED ORDER — LIDOCAINE HCL (CARDIAC) PF 100 MG/5ML IV SOSY
PREFILLED_SYRINGE | INTRAVENOUS | Status: DC | PRN
  Administered 2023-01-09: 100 mg via INTRAVENOUS

## 2023-01-09 MED ORDER — FENTANYL CITRATE (PF) 100 MCG/2ML IJ SOLN: 25.0000 ug | INTRAMUSCULAR | Status: DC | PRN

## 2023-01-09 MED ORDER — LIDOCAINE-EPINEPHRINE 1 %-1:100000 IJ SOLN
INTRAMUSCULAR | Status: DC | PRN
  Administered 2023-01-09: 40 mL via INTRAMUSCULAR

## 2023-01-09 MED ORDER — DEXAMETHASONE SODIUM PHOSPHATE 10 MG/ML IJ SOLN
INTRAMUSCULAR | Status: AC
  Filled 2023-01-09: qty 1

## 2023-01-09 MED ORDER — OXYCODONE HCL 5 MG/5ML PO SOLN: 5.0000 mg | Freq: Once | ORAL | Status: DC | PRN

## 2023-01-09 MED ORDER — PROPOFOL 10 MG/ML IV BOLUS
INTRAVENOUS | Status: DC | PRN
  Administered 2023-01-09: 200 mg via INTRAVENOUS

## 2023-01-09 MED ORDER — SUGAMMADEX SODIUM 500 MG/5ML IV SOLN
INTRAVENOUS | Status: AC
  Filled 2023-01-09: qty 5

## 2023-01-09 MED ORDER — OXYCODONE HCL 5 MG PO TABS: 5.0000 mg | ORAL_TABLET | Freq: Once | ORAL | Status: DC | PRN

## 2023-01-09 MED ORDER — FENTANYL CITRATE (PF) 100 MCG/2ML IJ SOLN
INTRAMUSCULAR | Status: AC
  Filled 2023-01-09: qty 2

## 2023-01-09 MED ORDER — OXYCODONE HCL 5 MG PO TABS: 5.0000 mg | ORAL_TABLET | ORAL | Status: DC | PRN

## 2023-01-09 MED ORDER — SCOPOLAMINE 1 MG/3DAYS TD PT72
MEDICATED_PATCH | TRANSDERMAL | Status: AC
  Filled 2023-01-09: qty 1

## 2023-01-09 MED ORDER — LACTATED RINGERS IV SOLN: INTRAVENOUS | Status: DC

## 2023-01-09 MED ORDER — SODIUM CHLORIDE 0.9 % IV SOLN
INTRAVENOUS | Status: DC | PRN
  Administered 2023-01-09: 40 mL

## 2023-01-09 MED ORDER — CHLORHEXIDINE GLUCONATE CLOTH 2 % EX PADS: 6.0000 | MEDICATED_PAD | Freq: Once | CUTANEOUS | Status: DC

## 2023-01-09 MED ORDER — PROMETHAZINE HCL 25 MG/ML IJ SOLN
INTRAMUSCULAR | Status: AC
  Filled 2023-01-09: qty 1

## 2023-01-09 MED ORDER — SUGAMMADEX SODIUM 200 MG/2ML IV SOLN
INTRAVENOUS | Status: DC | PRN
  Administered 2023-01-09: 200 mg via INTRAVENOUS

## 2023-01-09 MED ORDER — PHENYLEPHRINE 80 MCG/ML (10ML) SYRINGE FOR IV PUSH (FOR BLOOD PRESSURE SUPPORT)
PREFILLED_SYRINGE | INTRAVENOUS | Status: AC
  Filled 2023-01-09: qty 10

## 2023-01-09 MED ORDER — LIDOCAINE HCL 1 % IJ SOLN
INTRAVENOUS | Status: DC | PRN
  Administered 2023-01-09: 1000 mL

## 2023-01-09 MED ORDER — KETOROLAC TROMETHAMINE 30 MG/ML IJ SOLN
INTRAMUSCULAR | Status: AC
  Filled 2023-01-09: qty 1

## 2023-01-09 MED ORDER — ACETAMINOPHEN 325 MG RE SUPP: 650.0000 mg | RECTAL | Status: DC | PRN

## 2023-01-09 MED ORDER — SODIUM CHLORIDE 0.9 % IV SOLN: 250.0000 mL | INTRAVENOUS | Status: DC | PRN

## 2023-01-09 MED ORDER — ONDANSETRON HCL 4 MG/2ML IJ SOLN
INTRAMUSCULAR | Status: AC
  Filled 2023-01-09: qty 2

## 2023-01-09 MED ORDER — MIDAZOLAM HCL 2 MG/2ML IJ SOLN
INTRAMUSCULAR | Status: AC
  Filled 2023-01-09: qty 2

## 2023-01-09 MED ORDER — 0.9 % SODIUM CHLORIDE (POUR BTL) OPTIME
TOPICAL | Status: DC | PRN
  Administered 2023-01-09: 500 mL

## 2023-01-09 MED ORDER — EPINEPHRINE PF 1 MG/ML IJ SOLN
INTRAMUSCULAR | Status: AC
  Filled 2023-01-09: qty 1

## 2023-01-09 MED ORDER — CIPROFLOXACIN IN D5W 400 MG/200ML IV SOLN
400.0000 mg | INTRAVENOUS | Status: AC
  Administered 2023-01-09: 400 mg via INTRAVENOUS

## 2023-01-09 MED ORDER — MIDAZOLAM HCL 5 MG/5ML IJ SOLN
INTRAMUSCULAR | Status: DC | PRN
  Administered 2023-01-09: 2 mg via INTRAVENOUS

## 2023-01-09 MED ORDER — KETAMINE HCL 10 MG/ML IJ SOLN
INTRAMUSCULAR | Status: DC | PRN
  Administered 2023-01-09 (×4): 10 mg via INTRAVENOUS

## 2023-01-09 MED ORDER — CIPROFLOXACIN IN D5W 400 MG/200ML IV SOLN
INTRAVENOUS | Status: AC
  Filled 2023-01-09: qty 200

## 2023-01-09 MED ORDER — DEXAMETHASONE SODIUM PHOSPHATE 4 MG/ML IJ SOLN
INTRAMUSCULAR | Status: DC | PRN
  Administered 2023-01-09: 10 mg via INTRAVENOUS

## 2023-01-09 MED ORDER — EPHEDRINE SULFATE (PRESSORS) 50 MG/ML IJ SOLN
INTRAMUSCULAR | Status: DC | PRN
  Administered 2023-01-09 (×2): 5 mg via INTRAVENOUS

## 2023-01-09 MED ORDER — BUPIVACAINE HCL (PF) 0.25 % IJ SOLN
INTRAMUSCULAR | Status: AC
  Filled 2023-01-09: qty 60

## 2023-01-09 MED ORDER — CIPROFLOXACIN IN D5W 400 MG/200ML IV SOLN
INTRAVENOUS | Status: DC | PRN
  Administered 2023-01-09: 400 mg via INTRAVENOUS

## 2023-01-09 MED ORDER — FENTANYL CITRATE (PF) 100 MCG/2ML IJ SOLN
INTRAMUSCULAR | Status: DC | PRN
  Administered 2023-01-09 (×2): 50 ug via INTRAVENOUS
  Administered 2023-01-09: 25 ug via INTRAVENOUS

## 2023-01-09 MED ORDER — LIDOCAINE HCL (PF) 1 % IJ SOLN
INTRAMUSCULAR | Status: AC
  Filled 2023-01-09: qty 60

## 2023-01-09 MED ORDER — ACETAMINOPHEN 325 MG PO TABS: 650.0000 mg | ORAL_TABLET | ORAL | Status: DC | PRN

## 2023-01-09 MED ORDER — ROCURONIUM BROMIDE 10 MG/ML (PF) SYRINGE
PREFILLED_SYRINGE | INTRAVENOUS | Status: AC
  Filled 2023-01-09: qty 10

## 2023-01-09 MED ORDER — SODIUM CHLORIDE (PF) 0.9 % IJ SOLN
INTRAMUSCULAR | Status: AC
  Filled 2023-01-09: qty 10

## 2023-01-09 MED ORDER — PROMETHAZINE HCL 25 MG/ML IJ SOLN
6.2500 mg | INTRAMUSCULAR | Status: DC | PRN
  Administered 2023-01-09: 6.25 mg via INTRAVENOUS

## 2023-01-09 MED ORDER — KETOROLAC TROMETHAMINE 30 MG/ML IJ SOLN: 30.0000 mg | Freq: Once | INTRAMUSCULAR | Status: DC | PRN

## 2023-01-09 MED ORDER — SODIUM CHLORIDE 0.9% FLUSH: 3.0000 mL | INTRAVENOUS | Status: DC | PRN

## 2023-01-09 MED ORDER — ROCURONIUM BROMIDE 100 MG/10ML IV SOLN
INTRAVENOUS | Status: DC | PRN
  Administered 2023-01-09: 50 mg via INTRAVENOUS

## 2023-01-09 MED ORDER — SCOPOLAMINE 1 MG/3DAYS TD PT72
1.0000 | MEDICATED_PATCH | TRANSDERMAL | Status: DC
  Administered 2023-01-09: 1.5 mg via TRANSDERMAL

## 2023-01-09 MED ORDER — EPHEDRINE 5 MG/ML INJ
INTRAVENOUS | Status: AC
  Filled 2023-01-09: qty 5

## 2023-01-09 MED ORDER — KETAMINE HCL 50 MG/5ML IJ SOSY
PREFILLED_SYRINGE | INTRAMUSCULAR | Status: AC
  Filled 2023-01-09: qty 5

## 2023-01-09 SURGICAL SUPPLY — 64 items
ADH SKN CLS APL DERMABOND .7 (GAUZE/BANDAGES/DRESSINGS) ×2
AGENT HMST PWDR BTL CLGN 5GM (Miscellaneous) ×1 IMPLANT
APPLIER CLIP 9.375 MED OPEN (MISCELLANEOUS)
APR CLP MED 9.3 20 MLT OPN (MISCELLANEOUS)
BAG DECANTER FOR FLEXI CONT (MISCELLANEOUS) IMPLANT
BINDER ABDOMINAL 10 UNV 27-48 (MISCELLANEOUS) IMPLANT
BINDER ABDOMINAL 12 SM 30-45 (SOFTGOODS) IMPLANT
BIOPATCH RED 1 DISK 7.0 (GAUZE/BANDAGES/DRESSINGS) IMPLANT
BLADE CLIPPER SURG (BLADE) ×1 IMPLANT
BLADE HEX COATED 2.75 (ELECTRODE) ×1 IMPLANT
BLADE SURG 10 STRL SS (BLADE) ×2 IMPLANT
BLADE SURG 15 STRL LF DISP TIS (BLADE) ×1 IMPLANT
BLADE SURG 15 STRL SS (BLADE) ×1
CANISTER SUCT 1200ML W/VALVE (MISCELLANEOUS) ×1 IMPLANT
CLIP APPLIE 9.375 MED OPEN (MISCELLANEOUS) IMPLANT
COLLAGEN CELLERATERX 5 GRAM (Miscellaneous) IMPLANT
COVER BACK TABLE 60X90IN (DRAPES) ×1 IMPLANT
COVER MAYO STAND STRL (DRAPES) ×1 IMPLANT
DERMABOND ADVANCED .7 DNX12 (GAUZE/BANDAGES/DRESSINGS) ×2 IMPLANT
DRAIN CHANNEL 19F RND (DRAIN) IMPLANT
DRAPE LAPAROSCOPIC ABDOMINAL (DRAPES) ×1 IMPLANT
DRSG OPSITE POSTOP 4X12 (GAUZE/BANDAGES/DRESSINGS) ×1 IMPLANT
ELECT BLADE 4.0 EZ CLEAN MEGAD (MISCELLANEOUS) ×1
ELECT REM PT RETURN 9FT ADLT (ELECTROSURGICAL) ×1
ELECTRODE BLDE 4.0 EZ CLN MEGD (MISCELLANEOUS) ×1 IMPLANT
ELECTRODE REM PT RTRN 9FT ADLT (ELECTROSURGICAL) ×1 IMPLANT
EVACUATOR SILICONE 100CC (DRAIN) IMPLANT
GAUZE PAD ABD 8X10 STRL (GAUZE/BANDAGES/DRESSINGS) ×2 IMPLANT
GAUZE SPONGE 4X4 12PLY STRL (GAUZE/BANDAGES/DRESSINGS) IMPLANT
GLOVE BIO SURGEON STRL SZ 6.5 (GLOVE) ×2 IMPLANT
GLOVE BIOGEL PI IND STRL 7.0 (GLOVE) ×1 IMPLANT
GOWN STRL REUS W/ TWL LRG LVL3 (GOWN DISPOSABLE) ×2 IMPLANT
GOWN STRL REUS W/TWL LRG LVL3 (GOWN DISPOSABLE) ×2
LINER CANISTER 1000CC FLEX (MISCELLANEOUS) IMPLANT
NDL HYPO 25X1 1.5 SAFETY (NEEDLE) ×1 IMPLANT
NDL SAFETY ECLIP 18X1.5 (MISCELLANEOUS) IMPLANT
NEEDLE HYPO 25X1 1.5 SAFETY (NEEDLE) ×1 IMPLANT
NS IRRIG 1000ML POUR BTL (IV SOLUTION) ×1 IMPLANT
PACK BASIN DAY SURGERY FS (CUSTOM PROCEDURE TRAY) ×1 IMPLANT
PENCIL SMOKE EVACUATOR (MISCELLANEOUS) ×1 IMPLANT
PIN SAFETY STERILE (MISCELLANEOUS) IMPLANT
SLEEVE SCD COMPRESS KNEE MED (STOCKING) ×1 IMPLANT
SPONGE T-LAP 18X18 ~~LOC~~+RFID (SPONGE) ×2 IMPLANT
STRIP SUTURE WOUND CLOSURE 1/2 (MISCELLANEOUS) ×2 IMPLANT
SUT MNCRL AB 4-0 PS2 18 (SUTURE) ×2 IMPLANT
SUT MON AB 3-0 SH 27 (SUTURE) ×4
SUT MON AB 3-0 SH27 (SUTURE) ×2 IMPLANT
SUT MON AB 5-0 PS2 18 (SUTURE) IMPLANT
SUT PDS 3-0 CT2 (SUTURE) ×4
SUT PDS AB 2-0 CT2 27 (SUTURE) IMPLANT
SUT PDS II 3-0 CT2 27 ABS (SUTURE) ×5 IMPLANT
SUT SILK 2 0 SH (SUTURE) IMPLANT
SUT VICRYL 4-0 PS2 18IN ABS (SUTURE) ×2 IMPLANT
SYR 50ML LL SCALE MARK (SYRINGE) IMPLANT
SYR BULB IRRIG 60ML STRL (SYRINGE) ×1 IMPLANT
SYR CONTROL 10ML LL (SYRINGE) ×1 IMPLANT
SYR TB 1ML LL NO SAFETY (SYRINGE) IMPLANT
TOWEL GREEN STERILE FF (TOWEL DISPOSABLE) ×2 IMPLANT
TRAY DSU PREP LF (CUSTOM PROCEDURE TRAY) ×1 IMPLANT
TUBE CONNECTING 20X1/4 (TUBING) ×1 IMPLANT
TUBING INFILTRATION IT-10001 (TUBING) IMPLANT
TUBING SET GRADUATE ASPIR 12FT (MISCELLANEOUS) IMPLANT
UNDERPAD 30X36 HEAVY ABSORB (UNDERPADS AND DIAPERS) ×2 IMPLANT
YANKAUER SUCT BULB TIP NO VENT (SUCTIONS) ×1 IMPLANT

## 2023-01-09 NOTE — Interval H&P Note (Signed)
History and Physical Interval Note:  01/09/2023 7:56 AM  Margaret [name]  has presented today for surgery, with the diagnosis of Panniculitis.  The various methods of treatment have been discussed with the patient and family. After consideration of risks, benefits and other options for treatment, the patient has consented to  Procedure(s): PANNICULECTOMY (N/A) as a surgical intervention.  The patient's history has been reviewed, patient examined, no change in status, stable for surgery.  I have reviewed the patient's chart and labs.  Questions were answered to the patient's satisfaction.     [name]

## 2023-01-09 NOTE — Anesthesia Procedure Notes (Signed)
Procedure Name: Intubation Date/Time: 01/09/2023 8:40 AM  Performed by: Glory Buff, CRNAPre-anesthesia Checklist: Patient identified, Emergency Drugs available, Suction available and Patient being monitored Patient Re-evaluated:Patient Re-evaluated prior to induction Oxygen Delivery Method: Circle system utilized Preoxygenation: Pre-oxygenation with 100% oxygen Induction Type: IV induction Ventilation: Mask ventilation without difficulty Laryngoscope Size: Miller and 3 Grade View: Grade I Tube type: Oral Tube size: 7.0 mm Number of attempts: 1 Airway Equipment and Method: Stylet and Oral airway Placement Confirmation: ETT inserted through vocal cords under direct vision, positive ETCO2 and breath sounds checked- equal and bilateral Secured at: 20 cm Tube secured with: Tape Dental Injury: Teeth and Oropharynx as per pre-operative assessment

## 2023-01-09 NOTE — Discharge Instructions (Addendum)
NO TYLENOL TILL 130pm  INSTRUCTIONS FOR AFTER ABDOMINAL SURGERY  You will likely have some questions about what to expect following your operation.  The following information will help you and your family understand what to expect when you get home.  Following these guidelines will help ensure a smooth recovery and reduce risks of complications.  Postoperative instructions include information on: diet, wound care, medications and physical activity.  AFTER SURGERY Expect to go home after the procedure.  In some cases, you may need to spend one night in the hospital for observation.  DIET This surgery does not require a specific diet.  However, the healthier you eat the better your body can heal. It is important to increasing your protein intake.  Limit foods with high sugar and  carbohydrate content.  Focus on vegetables, meat and other protein sources if you are vegan or vegetarian.  If you undergo liposuction during your procedure it is very important to drink 8 oz of water every hour while awake for 2 days.  If your urine is bright yellow, then it is concentrated, and you need to drink more water.  If you find you are persistently nauseated or unable to take in liquids let us know.  NO TOBACCO USE or EXPOSURE.  This will slow your healing process and increase the risk of a wound.  WOUND CARE Leave the abdominal binder in place for 3 days.  Then you can remove it and shower.  Replace the binder or spanx after your shower.   You may have Topifoam or Lipofoam on.  It is soft and spongy and helps keep you from getting creases if you have liposuction.  This can be removed before the shower and then replaced.  If you need more it is available on [place] as lipofoam.  If you have steri-strips / tape directly attached to your skin leave them in place. It is OK to get these wet.  No baths, pools or hot tubs for four weeks. We close your incision to leave the smallest and best-looking scar. No ointment or  creams on your incisions until cleared by your surgeon.  No Neosporin (Too many skin reactions with this one).  After the steri-strips are off can use Mederma or Skinuva and start massaging the scar. Continue to wear the binder/spanx or Ace wrap around the clock, including while sleeping, for 6 weeks. This provides added comfort and helps reduce the fluid accumulation at the surgery site.  ACTIVITY No heavy lifting until cleared by the doctor.  For example, no more than a half-gallon of milk.  It is OK to walk and you are encouraged to move your legs to help decrease your risk of getting a blood clot.  It will also help keep you from getting deconditioned.  Every 1 to 2 hours get up and walk for 5 minutes. This will help with a quicker recovery back to normal.  Let pain be your guide so you don't do too much.     SLEEPING / RESTING Sleeping and resting should be in the jack-knife or bent forward position with your head elevated.  This will help reduce pulling on your abdominal incision.  You can elevate your head and upper back with a few pillows and place a pillow under your knees.  Avoid stomach sleeping for 3 months.   WORK Everyone returns to work at different times. As a rough guide, most people take 1 - 2 weeks off prior to returning to work. If  you need documentation for your job give them to the front staff for processing.  DRIVING Arrange for someone to bring you home from the hospital.  You may be able to drive a few days after surgery but not while taking any narcotics or valium.  This is for your safety as well as others sharing the road with you.  BOWEL MOVEMENTS Constipation can occur after anesthesia and while taking pain medication.  It is important to stay ahead for your comfort.  We recommend taking Milk of Magnesia (2 tablespoons; twice a day) while taking the pain pills.  MEDICATIONS (you may receive and should be started after surgery) At your preoperative visit for you  history and physical you were given the following medications: Antibiotic: Start this medication when you get home and take according to the instructions on the bottle. Zofran 4 mg:  This is to treat nausea and vomiting.  You can take this every 6 hours as needed and only if needed. Norco (hydrocodone/acetaminophen) 5/325 mg:  This is only to be used after you have taken the Motrin or the Tylenol. Every 8 hours as needed.  Over the counter Medication to take: Ibuprofen (Motrin) 600 mg:  Take this every 6 hours.  If you have additional pain then take 500 mg of the Tylenol.  Only take the Norco after you have tried these two. MiraLAX or stool softener of choice: Take this according to the bottle if you take the [place] Call your surgeon's office if any of the following occur:  Fever 101 degrees F or greater  Excessive bleeding or fluid from the incision site.  Pain that increases over time without aid from the medications  Redness, warmth, or pus draining from incision sites  Persistent nausea or inability to take in liquids  Severe misshapen area that underwent the operation.  Information for Discharge Teaching: EXPAREL (bupivacaine liposome injectable suspension)   Your surgeon or anesthesiologist gave you EXPAREL(bupivacaine) to help control your pain after surgery.  EXPAREL is a local anesthetic that provides pain relief by numbing the tissue around the surgical site. EXPAREL is designed to release pain medication over time and can control pain for up to 72 hours. Depending on how you respond to EXPAREL, you may require less pain medication during your recovery.  Possible side effects: Temporary loss of sensation or ability to move in the area where bupivacaine was injected. Nausea, vomiting, constipation Rarely, numbness and tingling in your mouth or lips, lightheadedness, or anxiety may occur. Call your doctor right away if you think you may be experiencing any of these  sensations, or if you have other questions regarding possible side effects.  Follow all other discharge instructions given to you by your surgeon or nurse. Eat a healthy diet and drink plenty of water or other fluids.  If you return to the hospital for any reason within 96 hours following the administration of EXPAREL, it is important for health care providers to know that you have received this anesthetic. A teal colored band has been placed on your arm with the date, time and amount of EXPAREL you have received in order to alert and inform your health care providers. Please leave this armband in place for the full 96 hours following administration, and then you may remove the band.  About my Jackson-Pratt Bulb Drain  What is a Jackson-Pratt bulb? A Jackson-Pratt is a soft, round device used to collect drainage. It is connected to a long,  thin drainage catheter, which is held in place by one or two small stiches near your surgical incision site. When the bulb is squeezed, it forms a vacuum, forcing the drainage to empty into the bulb.  Emptying the Jackson-Pratt bulb- To empty the bulb: 1. Release the plug on the top of the bulb. 2. Pour the bulb's contents into a measuring container which your nurse will provide. 3. Record the time emptied and amount of drainage. Empty the drain(s) as often as your     doctor or nurse recommends.  Date                  Time                    Amount (Drain 1)                 Amount (Drain  2)  _____________________________________________________________________  _____________________________________________________________________  _____________________________________________________________________  _____________________________________________________________________  _____________________________________________________________________  _____________________________________________________________________  _____________________________________________________________________  _____________________________________________________________________  Squeezing the Jackson-Pratt Bulb- To squeeze the bulb: 1. Make sure the plug at the top of the bulb is open. 2. Squeeze the bulb tightly in your fist. You will hear air squeezing from the bulb. 3. Replace the plug while the bulb is squeezed. 4. Use a safety pin to attach the bulb to your clothing. This will keep the catheter from     pulling at the bulb insertion site.  When to call your doctor- Call your doctor if: Drain site becomes red, swollen or hot. You have a fever greater than 101 degrees F. There is oozing at the drain site. Drain falls out (apply a guaze bandage over the drain hole and secure it with tape). Drainage increases daily not related to activity patterns. (You will usually have more drainage when you are active than when you are resting.) Drainage has a bad odor.

## 2023-01-09 NOTE — Op Note (Signed)
Operative Report  Date of operation: 01/09/2023  Patient: Margaret [name], MRN: [id number], 47 y.o. female.   Date of birth: 1976/06/04  Location: [facility]  Preoperative Diagnosis: Panniculitis  Postoperative Diagnosis:  Same  Procedure: Panniculectomy  Surgeon:  Audelia Hives  Assistant:  Roetta Sessions, PA  Anesthesia:  General  EBL:  100cc  Drains:  2 number 11 blake round drains  Condition:  Stable  Complications: None  Disposition: Recovery Room  Procedure in Detail: Patient was seen the morning of her surgery and marked out for the procedure. She was then given an IV and IV antibiotics. The patient was taken to the operating room and underwent general anesthesia.  A time out was called and all information was confirmed to be correct. SCD's and a pillow under the knees was in place. The patient was then prepped and draped in the standard sterile fashion. Local was placed into the incision and 2 stab incisions made through which 400 cc of tumescent was placed in each flank area. The flanks were liposuctioned and 800 cc removed from the sides. The planned lower incision was then incised and the incision taken down through the Scarpa's fascia to the rectus abdominus fascia. The skin and subcutaneous tissue was then lifted off the fascia up to the level of the umbilicus. The umbilicus is then circumscribed and freed up from the surrounding skin and freed down to the abdominal wall. The abdominal wall was then freed above the umbilicus but not widely up to the level of the xiphoid.  The patient was then flexed on the table and the amount that could be excised was confirmed. The pannus was excised and it weighed 1510 grams. The OR table was again put in the flat position and the plication of the rectus done with figure of 8 zero ethibond suture. The mons was also suspended with 3-0 Monocryl.  The wound was irrigated with normal saline solution. Cellerate  was placed in the site.  The Experel was injected into the fascia. Two #19 blake round drains were placed and secured with 3-0 Silk. The patient was put back in the flexed position and the closure begun. The new position of the umbilicus was determined and a small v shaped incision placed in the abdominal wall. The umbilicus was secured to the fascia with 4.0 vicryl at 12, 3,6, and 9 o'clock. It was inset from the dermis to the abdominal wall and then run closed with 4-0 Monocryl in a subcuticular fashion. The abdominal wall was closed with buried 20- and 3-0 PDS.  The final layer was closed with the 3-0 Monocryl and subcuticular 4-0 Monocryl.    The wound was then dressed with dermabond. ABD's and an abdominal binder were placed. Patient was allowed to wake up, extubated and taken on a stretcher in the flexed position to the recovery room. Family was notified at the end of the case.   The advanced practice practitioner (APP) assisted throughout the case.  The APP was essential in retraction and counter traction when needed to make the case progress smoothly.  This retraction and assistance made it possible to see the tissue plans for the procedure.  The assistance was needed for blood control, tissue re-approximation and assisted with closure of the incision site.

## 2023-01-09 NOTE — Anesthesia Postprocedure Evaluation (Signed)
Anesthesia Post Note  Patient: Margaret [name]  Procedure(s) Performed: PANNICULECTOMY WITH LIPOSUCTION (Abdomen)     Patient location during evaluation: PACU Anesthesia Type: General Level of consciousness: awake Pain management: pain level controlled Vital Signs Assessment: post-procedure vital signs reviewed and stable Respiratory status: spontaneous breathing, nonlabored ventilation and respiratory function stable Cardiovascular status: blood pressure returned to baseline and stable Postop Assessment: no apparent nausea or vomiting Anesthetic complications: no   No notable events documented.  Last Vitals:  Vitals:   01/09/23 1130 01/09/23 1145  BP: (!) 141/87 (!) 136/90  Pulse: 93 93  Resp: 18 16  Temp:    SpO2: 95% 97%    Last Pain:  Vitals:   01/09/23 0728  TempSrc: Oral  PainSc: 0-No pain                 Nilda Simmer

## 2023-01-09 NOTE — Transfer of Care (Signed)
Immediate Anesthesia Transfer of Care Note  Patient: NIHLA HOOS  Procedure(s) Performed: PANNICULECTOMY WITH LIPOSUCTION (Abdomen)  Patient Location: PACU  Anesthesia Type:General  Level of Consciousness: drowsy, patient cooperative, and responds to stimulation  Airway & Oxygen Therapy: Patient Spontanous Breathing and Patient connected to face mask oxygen  Post-op Assessment: Report given to RN and Post -op Vital signs reviewed and stable  Post vital signs: Reviewed and stable  Last Vitals:  Vitals Value Taken Time  BP 147/93 01/09/23 1040  Temp    Pulse 95 01/09/23 1041  Resp 19 01/09/23 1041  SpO2 99 % 01/09/23 1041  Vitals shown include unvalidated device data.  Last Pain:  Vitals:   01/09/23 0728  TempSrc: Oral  PainSc: 0-No pain         Complications: No notable events documented.

## 2023-01-10 ENCOUNTER — Encounter (HOSPITAL_BASED_OUTPATIENT_CLINIC_OR_DEPARTMENT_OTHER): Payer: Self-pay | Admitting: Plastic Surgery

## 2023-01-14 ENCOUNTER — Telehealth: Payer: Self-pay | Admitting: *Deleted

## 2023-01-14 NOTE — Telephone Encounter (Signed)
Call received from Ms. Bastone. Discussed post-op wound care. Advised she can remove outer dressings over incision but to leave steri-strips in place until post-op appt on Friday. Message given to front desk to call pt to reschedule appt on Friday to a later time per her request

## 2023-01-17 ENCOUNTER — Telehealth: Payer: Self-pay

## 2023-01-17 ENCOUNTER — Encounter: Payer: Self-pay | Admitting: Plastic Surgery

## 2023-01-17 NOTE — Telephone Encounter (Signed)
Pt left voicemail stating that she noticed redness at incision site and itching. Wanted to speak to a provider. Pt had panniculectomy on 2/14 with Dr. Keturah Barre. Call back # [phone].

## 2023-01-18 ENCOUNTER — Ambulatory Visit (INDEPENDENT_AMBULATORY_CARE_PROVIDER_SITE_OTHER): Payer: BC Managed Care – PPO | Admitting: Physician Assistant

## 2023-01-18 ENCOUNTER — Encounter: Payer: Self-pay | Admitting: Physician Assistant

## 2023-01-18 ENCOUNTER — Encounter: Payer: BC Managed Care – PPO | Admitting: Physician Assistant

## 2023-01-18 VITALS — BP 147/88 | HR 81

## 2023-01-18 DIAGNOSIS — M793 Panniculitis, unspecified: Secondary | ICD-10-CM

## 2023-01-18 NOTE — Telephone Encounter (Signed)
I spoke with her last night, She was doing well. She had some redness along the incision no fevers. She wanted to wait to make any changes in her care plan until seen in person today.

## 2023-01-18 NOTE — Progress Notes (Signed)
This is a 47 year old female seen in our office for follow-up evaluation status post panniculectomy by Dr. Marla Roe on 01/09/2023.  The patient notes postoperatively she had significant redness along the incisions including the umbilicus.  She denies any fever, she notes it is very itchy.  She notes that she is very sensitive to adhesives and feels like this is an allergic reaction.  She notes no significant changes over the last several days.  On exam chaperone present, lower abdominal incision is clean dry and intact with routine healing there is surrounding redness along the entire incision as well as circumferentially around the umbilicus, no significant warmth to touch no palpable fluid collections.  Bilateral drains are in place with no surrounding redness at the drain sites with minimal serosanguineous output.  Photos added to the chart yesterday no significant changes to these  Overall the patient is doing well she does have significant redness surrounding her incisions, I have high suspicion for skin reaction versus cellulitis.  She does have a history of sensitivity to adhesives, is along the incisions and is not along the drain site.  She has had no significant worsening.  We discussed antibiotics but given the high suspicion for the allergic type reaction we will continue to watch closely.  If she develops any new or worsening signs or symptoms she will reach out to [place] immediately.  Will have her follow-up in our office next week for repeat evaluation.  I did pull 1 drain is the total for the drains were approximately 30 cc over the last 24 hours.  The right drain was pulled, we will plan to pull the left drain next week as long as her output continues to stay minimal.  No photos were taken today as the photos were entered in the chart last night with no significant changes.

## 2023-01-22 ENCOUNTER — Other Ambulatory Visit (HOSPITAL_COMMUNITY): Payer: Self-pay

## 2023-01-22 ENCOUNTER — Other Ambulatory Visit: Payer: Self-pay

## 2023-01-22 ENCOUNTER — Ambulatory Visit (INDEPENDENT_AMBULATORY_CARE_PROVIDER_SITE_OTHER): Payer: BC Managed Care – PPO | Admitting: Surgical

## 2023-01-22 DIAGNOSIS — M793 Panniculitis, unspecified: Secondary | ICD-10-CM

## 2023-01-22 DIAGNOSIS — G8929 Other chronic pain: Secondary | ICD-10-CM

## 2023-01-22 DIAGNOSIS — M545 Low back pain, unspecified: Secondary | ICD-10-CM

## 2023-01-22 MED ORDER — TRIAMCINOLONE ACETONIDE 0.025 % EX OINT
1.0000 | TOPICAL_OINTMENT | Freq: Two times a day (BID) | CUTANEOUS | 0 refills | Status: DC
  Filled 2023-01-22: qty 30, 15d supply, fill #0

## 2023-01-22 NOTE — Progress Notes (Signed)
47 year old female here for follow-up after panniculectomy Dr. Marla Roe on 01/09/2023  She was last seen in the office Friday, 01/18/2023, she had some irritation/developing rash of the abdominal incision and umbilicus.  She reports that the Steri-Strips and Dermabond was removed.  She reports that the JP drain output from the left JP drain has been approximately 25 cc per 24 hours.  She is not having any infectious symptoms.  She reports she has been using hydrocortisone 1% ointment around the peri-incisional area of her abdomen and umbilicus, reports that the rash has not worsened but is minimally improving.  She has also been using Benadryl at night.  Chaperone present on exam On exam umbilicus is viable, abdominal incision is intact and healing well.  Left JP drain with 18 cc of serosanguineous fluid in the bulb.  She does have a maculopapular rash and redness along the peri-incisional area of the lower abdominal incision.  She has a maculopapular rash extending upward towards her abdomen along the midline in the periumbilical area.  I do not appreciate any cellulitic changes, no increased tenderness with palpation.  No subcutaneous fluid collections with palpation.  A/P:  Left JP drain was removed, patient tolerated this well.  Recommend Vaseline and gauze to left JP drain insertion site wound until it has completely healed.  In regard to her abdominal redness/rash, most likely related to skin reaction related to adhesives used intra and postoperatively.  I do not appreciate any overt signs of infection at this time.  No antibiotics necessary, however will prescribe prescription strength steroids for her to use in the peri-incisional areas 1-2 times per day.  I would also like her to continue with Zyrtec, Allegra or another daytime antihistamine throughout the day and Benadryl at night.  Recommend following up in 2 weeks for reevaluation.  Pictures were obtained of the patient and placed in the  chart with the patient's or guardian's permission.

## 2023-01-29 ENCOUNTER — Ambulatory Visit (INDEPENDENT_AMBULATORY_CARE_PROVIDER_SITE_OTHER): Payer: BC Managed Care – PPO | Admitting: Family Medicine

## 2023-02-01 ENCOUNTER — Ambulatory Visit (INDEPENDENT_AMBULATORY_CARE_PROVIDER_SITE_OTHER): Payer: BC Managed Care – PPO | Admitting: Surgical

## 2023-02-01 DIAGNOSIS — M545 Low back pain, unspecified: Secondary | ICD-10-CM

## 2023-02-01 DIAGNOSIS — M793 Panniculitis, unspecified: Secondary | ICD-10-CM

## 2023-02-01 DIAGNOSIS — G8929 Other chronic pain: Secondary | ICD-10-CM

## 2023-02-01 NOTE — Progress Notes (Signed)
Patient is a 47 year old female here for follow-up after panniculectomy w Dr. Marla Roe on 01/09/2023.   She is approximately 3 weeks postop.  Patient reports she is doing well, she has been using the steroid cream in the areas of irritation and feels that this has been helpful.  She reports she has noticed some dry skin along the peri-incisional area, she is not sure if this is dry skin or Dermabond.  Chaperone present on exam On exam umbilicus viable, abdominal incision is intact and well-healed.  She does have a few suture knots noted.  There is no erythema or cellulitic changes.  There is some residual maculopapular rash on the upper abdomen.  No subcutaneous fluid collection noted.  A/P:  Continue with compressive garments, which needs activities or heavy lifting. Avoid submerging incision in water Can begin using scar cream, recommend Silagen and silicone scar sheets   Follow-up in 3 weeks

## 2023-02-05 ENCOUNTER — Ambulatory Visit
Admission: RE | Admit: 2023-02-05 | Discharge: 2023-02-05 | Disposition: A | Discharge status: Home / Self Care | Payer: BC Managed Care – PPO | Source: Ambulatory Visit | Attending: Obstetrics and Gynecology | Admitting: Obstetrics and Gynecology

## 2023-02-05 DIAGNOSIS — Z1231 Encounter for screening mammogram for malignant neoplasm of breast: Secondary | ICD-10-CM

## 2023-02-13 ENCOUNTER — Ambulatory Visit (INDEPENDENT_AMBULATORY_CARE_PROVIDER_SITE_OTHER): Payer: BC Managed Care – PPO | Admitting: Family Medicine

## 2023-02-15 ENCOUNTER — Encounter: Payer: BC Managed Care – PPO | Admitting: Surgical

## 2023-02-19 ENCOUNTER — Encounter: Payer: Self-pay | Admitting: Surgical

## 2023-02-19 ENCOUNTER — Ambulatory Visit (INDEPENDENT_AMBULATORY_CARE_PROVIDER_SITE_OTHER): Payer: BC Managed Care – PPO | Admitting: Surgical

## 2023-02-19 VITALS — BP 122/85 | HR 75

## 2023-02-19 DIAGNOSIS — M793 Panniculitis, unspecified: Secondary | ICD-10-CM

## 2023-02-19 NOTE — Progress Notes (Signed)
47 year old female here for follow-up after panniculectomy with Dr. Marla Roe on 01/09/2023.  She is 6 weeks postop.  She is doing really well.  She does report some tenderness along the right lower abdomen with palpation, reports it feels "bruised".  She is not having any infectious symptoms.  She has been massaging the right abdominal incision where she has some firmness.  Chaperone present on exam On exam umbilicus is viable, abdominal incisions are intact and healing well.  There is no erythema or cellulitic changes noted of the abdomen.  Maculopapular rash has resolved.  No subcutaneous fluid collection noted palpation.  She does have an area of firmness along the right mid to lateral abdominal incision, suspect some postoperative scarring along the incision.  There is no underlying fluctuance or significant tenderness noted.  No overlying skin changes.  A/P:  No restrictions at this time, recommend continued use scar cream and scar sheets for optimal scar healing Recommend massaging to the right mid to lateral abdominal incision to soften the scarring. In regards to compression, recommend continue with compressive garments when active, but no longer necessary 24/7 or overnight.  Pictures were obtained of the patient and placed in the chart with the patient's or guardian's permission.  Follow-up as needed

## 2023-02-22 ENCOUNTER — Encounter: Payer: BC Managed Care – PPO | Admitting: Surgical

## 2023-03-04 ENCOUNTER — Other Ambulatory Visit: Payer: Self-pay | Admitting: Family Medicine

## 2023-03-04 ENCOUNTER — Other Ambulatory Visit (HOSPITAL_COMMUNITY): Payer: Self-pay

## 2023-03-05 ENCOUNTER — Other Ambulatory Visit (HOSPITAL_COMMUNITY): Payer: Self-pay

## 2023-03-05 MED ORDER — VALACYCLOVIR HCL 500 MG PO TABS
500.0000 mg | ORAL_TABLET | Freq: Two times a day (BID) | ORAL | 1 refills | Status: DC
  Filled 2023-03-05 – 2023-03-07 (×2): qty 30, 15d supply, fill #0

## 2023-03-05 NOTE — Telephone Encounter (Signed)
Pt requesting refill for Valtrex, prescribed by former PCP. Okay to fill?

## 2023-03-07 ENCOUNTER — Other Ambulatory Visit (HOSPITAL_COMMUNITY): Payer: Self-pay

## 2023-03-08 ENCOUNTER — Other Ambulatory Visit: Payer: Self-pay

## 2023-03-12 ENCOUNTER — Ambulatory Visit: Payer: BC Managed Care – PPO | Admitting: Physician Assistant

## 2023-03-27 ENCOUNTER — Other Ambulatory Visit: Payer: Self-pay

## 2023-03-27 MED ORDER — BUPROPION HCL ER (XL) 300 MG PO TB24: 300.0000 mg | ORAL_TABLET | Freq: Every day | ORAL | 1 refills | Status: DC

## 2023-03-29 ENCOUNTER — Ambulatory Visit: Payer: BC Managed Care – PPO | Admitting: Physician Assistant

## 2023-04-02 ENCOUNTER — Ambulatory Visit (INDEPENDENT_AMBULATORY_CARE_PROVIDER_SITE_OTHER): Payer: BC Managed Care – PPO | Admitting: Family Medicine

## 2023-04-05 ENCOUNTER — Encounter: Payer: Self-pay | Admitting: Physician Assistant

## 2023-04-05 ENCOUNTER — Ambulatory Visit (INDEPENDENT_AMBULATORY_CARE_PROVIDER_SITE_OTHER): Payer: BC Managed Care – PPO | Admitting: Physician Assistant

## 2023-04-05 DIAGNOSIS — F319 Bipolar disorder, unspecified: Secondary | ICD-10-CM

## 2023-04-05 DIAGNOSIS — F411 Generalized anxiety disorder: Secondary | ICD-10-CM

## 2023-04-05 NOTE — Progress Notes (Unsigned)
[facility]  Patient ID: Margaret [name],  MRN: [id number]  PCP: Margaret Leriche, PA-C  Date of Evaluation: 04/05/2023 Time spent:20 minutes  Chief Complaint:  Chief Complaint   Depression; Anxiety; Follow-up    HISTORY/CURRENT STATUS: For routine med check.  Has had a lot of stress these past 6 months:  Found out that her dad is not her dad on 44 and me, and also on [email or web address].  [name] graduated college yesterday, twins are 21 soon.  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   Patient is able to enjoy things.  Energy and motivation are good.   No extreme sadness, tearfulness, or feelings of hopelessness.  Sleeps well most of the time. ADLs and personal hygiene are normal.   Denies any changes in concentration, making decisions, or remembering things.  Appetite has not changed.  Not anxious.  Denies suicidal or homicidal thoughts.  Patient denies increased energy with decreased need for sleep, increased talkativeness, racing thoughts, impulsivity or risky behaviors, increased spending, increased libido, grandiosity, increased irritability or anger, paranoia, or hallucinations.  Denies dizziness, syncope, seizures, numbness, tingling, tremor, tics, unsteady gait, slurred speech, confusion. Denies muscle or joint pain, stiffness, or dystonia.  Individual Medical History/ Review of Systems: Changes? :Yes    had paniculectomy since our LOV.  Past medications for mental health diagnoses include: Lexapro caused sexual side effects, Depakote, Effexor, Zoloft, Prozac, Trileptal, Lamictal, Wellbutrin  Allergies: Dilaudid [hydromorphone], Amoxicillin, Ancef [cefazolin], Penicillins, Adhesive [tape], and Keflex [cephalexin]  Current Medications:  Current Outpatient Medications:    Acetaminophen (TYLENOL 8 HOUR PO), Take by mouth., Disp: , Rfl:    buPROPion (WELLBUTRIN XL) 300  MG 24 hr tablet, Take 1 tablet (300 mg total) by mouth daily., Disp: 90 tablet, Rfl: 1   cholecalciferol (VITAMIN D3) 25 MCG (1000 UNIT) tablet, Take 1,000 Units by mouth daily., Disp: , Rfl:    cyanocobalamin (VITAMIN B12) 500 MCG tablet, Take 500 mcg by mouth daily., Disp: , Rfl:    docusate sodium (COLACE) 100 MG capsule, Take 100 mg by mouth 2 (two) times daily., Disp: , Rfl:    ferrous sulfate 324 (65 Fe) MG TBEC, Take by mouth., Disp: , Rfl:    ibuprofen (ADVIL,MOTRIN) 200 MG tablet, Take 600 mg by mouth every 6 (six) hours as needed for headache or mild pain., Disp: , Rfl:    metFORMIN (GLUCOPHAGE) 500 MG tablet, Take 1 tablet (500 mg total) by mouth 2 (two) times daily with a meal., Disp: 180 tablet, Rfl: 0   nystatin (MYCOSTATIN/NYSTOP) powder, Apply topically 4 times daily., Disp: 60 g, Rfl: 0   ondansetron (ZOFRAN) 4 MG tablet, Take 1 tablet (4 mg total) by mouth every 8 (eight) hours as needed for nausea or vomiting., Disp: 20 tablet, Rfl: 0   oxybutynin (DITROPAN-XL) 10 MG 24 hr tablet, TAKE 1 TABLET AT BEDTIME (Patient not taking: Reported on 04/05/2023), Disp: 90 tablet, Rfl: 3   polycarbophil (FIBERCON) 625 MG tablet, Take 625 mg by mouth daily. (Patient not taking: Reported on 04/05/2023), Disp: , Rfl:    senna (SENOKOT) 8.6 MG tablet, Take 1 tablet by mouth daily. (Patient not taking: Reported on 04/05/2023), Disp: , Rfl:    triamcinolone (KENALOG) 0.025 % ointment, Apply to affected area 2 (two) times daily. (Patient not taking: Reported on 04/05/2023), Disp: 30 g, Rfl: 0   valACYclovir (VALTREX) 500 MG tablet,  Take 1 tablet (500 mg total) by mouth 2 (two) times daily for 3 days. (Patient not taking: Reported on 04/05/2023), Disp: 30 tablet, Rfl: 1   vitamin C (ASCORBIC ACID) 250 MG tablet, Take 250 mg by mouth daily. (Patient not taking: Reported on 04/05/2023), Disp: , Rfl:  Medication Side Effects: none  Family Medical/ Social History: Changes? No  MENTAL HEALTH EXAM:  Last  menstrual period 09/14/2018.There is no height or weight on file to calculate BMI.  General Appearance: Casual and Well Groomed  Eye Contact:  Good  Speech:  Clear and Coherent and Normal Rate  Volume:  Normal  Mood:  Euthymic  Affect:  Appropriate  Thought Process:  Goal Directed and Descriptions of Associations: Circumstantial  Orientation:  Full (Time, Place, and Person)  Thought Content: Logical   Suicidal Thoughts:  No  Homicidal Thoughts:  No  Memory:  WNL  Judgement:  Good  Insight:  Good  Psychomotor Activity:  Normal  Concentration:  Concentration: Good and Attention Span: Good  Recall:  Good  Fund of Knowledge: Good  Language: Good  Assets:  Desire for Improvement [name] Resources/Insurance Housing Transportation Vocational/Educational  ADL's:  Intact  Cognition: WNL  Prognosis:  Good   DIAGNOSES:    ICD-10-CM   1. Bipolar I disorder (HCC)  F31.9     2. Generalized anxiety disorder  F41.1      Receiving Psychotherapy: No   RECOMMENDATIONS:  PDMP reviewed.  A few oxycodone given 12/25/2022. I provided 20 minutes of face to face time during this encounter, including time spent before and after the visit in records review, medical decision making, counseling pertinent to today's visit, and charting.   She's doing well under the circumstances, the Wellbutrin is effective. No changes recommended.   Continue Wellbutrin XL 300 mg daily. Return in 9 months.   Margaret Overly, PA-C

## 2023-04-16 ENCOUNTER — Encounter (INDEPENDENT_AMBULATORY_CARE_PROVIDER_SITE_OTHER): Payer: Self-pay | Admitting: Family Medicine

## 2023-04-16 ENCOUNTER — Ambulatory Visit (INDEPENDENT_AMBULATORY_CARE_PROVIDER_SITE_OTHER): Payer: BC Managed Care – PPO | Admitting: Family Medicine

## 2023-04-16 VITALS — BP 127/65 | HR 83 | Temp 98.0°F | Ht 63.0 in | Wt 192.0 lb

## 2023-04-16 DIAGNOSIS — Z6834 Body mass index (BMI) 34.0-34.9, adult: Secondary | ICD-10-CM | POA: Diagnosis not present

## 2023-04-16 DIAGNOSIS — F439 Reaction to severe stress, unspecified: Secondary | ICD-10-CM

## 2023-04-16 DIAGNOSIS — R7303 Prediabetes: Secondary | ICD-10-CM

## 2023-04-16 DIAGNOSIS — E669 Obesity, unspecified: Secondary | ICD-10-CM | POA: Diagnosis not present

## 2023-04-16 MED ORDER — METFORMIN HCL 500 MG PO TABS: 500.0000 mg | ORAL_TABLET | Freq: Two times a day (BID) | ORAL | 0 refills | Status: DC

## 2023-04-17 NOTE — Progress Notes (Signed)
Chief Complaint:   OBESITY Margaret [name] is here to discuss her progress with her obesity treatment plan along with follow-up of her obesity related diagnoses. Margaret [name] is on the Category 2 Plan and states she is following her eating plan approximately 0% of the time. Margaret [name] states she is doing 0 minutes 0 times per week.  Today's visit was #: 8 Starting weight: 217 lbs Starting date: 06/14/2022 Today's weight: 192 lbs Today's date: 04/16/2023 Total lbs lost to date: 25 Total lbs lost since last in-office visit: 0  Interim History: Tuesday's last visit was approximately 4 months ago. She has not been concentrating on her weight loss but she states she is ready to get back to her weight loss. She is very tired of her Category 2 plan. She will be changing jobs soon and will be working from home.   Subjective:   1. Prediabetes Margaret [name] is on metformin but she is struggling with her eating plan. She notes not feeling much benefit from metformin when not following her eating plan.   2. Stress [name] has had increased stress with changing jobs and family drama. She notes some increase sugar cravings, which are likely stress related.   Assessment/Plan:   1. Prediabetes Margaret [name] will continue metformin 500 mg BID and we will refill for 90 days via [facility]. She will get back to her structured eating plan.   - metFORMIN (GLUCOPHAGE) 500 MG tablet; Take 1 tablet (500 mg total) by mouth 2 (two) times daily with a meal.  Dispense: 180 tablet; Refill: 0  2. Stress Emotional eating behavior strategies were discussed. Margaret [name] will continue Wellbutrin as is and we will continue to monitor.   3. BMI 34.0-34.9,adult  4. Obesity, Beginning BMI 38.44 Margaret [name] is currently in the action stage of change. As such, her goal is to continue with weight loss efforts. She has agreed to change to the [employer].   Exercise goals: All adults should avoid inactivity. Some physical activity is better than  none, and adults who participate in any amount of physical activity gain some health benefits.  Behavioral modification strategies: increasing lean protein intake, meal planning and cooking strategies, and emotional eating strategies.  Margaret [name] has agreed to follow-up with our clinic in 4 weeks. She was informed of the importance of frequent follow-up visits to maximize her success with intensive lifestyle modifications for her multiple health conditions.   Objective:   Blood pressure 127/65, pulse 83, temperature 98 F (36.7 C), height 5\' 3"  (1.6 m), weight 192 lb (87.1 kg), last menstrual period 09/14/2018, SpO2 97 %. Body mass index is 34.01 kg/m.  Lab Results  Component Value Date   CREATININE 0.71 12/28/2022   BUN 11 12/28/2022   NA 140 12/28/2022   K 4.4 12/28/2022   CL 105 12/28/2022   CO2 25 12/28/2022   Lab Results  Component Value Date   ALT 12 12/28/2022   AST 9 12/28/2022   ALKPHOS 39 12/28/2022   BILITOT 0.5 12/28/2022   Lab Results  Component Value Date   HGBA1C 6.0 12/28/2022   HGBA1C 6.1 (H) 06/14/2022   HGBA1C 6.3 01/05/2022   HGBA1C 6.4 07/05/2021   HGBA1C 6.0 12/01/2020   Lab Results  Component Value Date   INSULIN 17.6 06/14/2022   Lab Results  Component Value Date   TSH 1.59 12/28/2022   Lab Results  Component Value Date   CHOL 188 12/28/2022   HDL 48.00 12/28/2022   LDLCALC 120 (H) 12/28/2022  TRIG 102.0 12/28/2022   CHOLHDL 4 12/28/2022   Lab Results  Component Value Date   VD25OH 30.09 12/28/2022   VD25OH 32.2 06/14/2022   VD25OH 11.98 (L) 01/05/2022   Lab Results  Component Value Date   WBC 8.1 12/28/2022   HGB 13.1 12/28/2022   HCT 39.3 12/28/2022   MCV 84.7 12/28/2022   PLT 297.0 12/28/2022   Lab Results  Component Value Date   IRON 36 (L) 10/13/2014   TIBC 430 10/13/2014   FERRITIN 10 10/13/2014   Attestation Statements:   Reviewed by clinician on day of visit: allergies, medications, problem list, medical history,  surgical history, family history, social history, and previous encounter notes.   I, Burt Knack, am acting as transcriptionist for Quillian Quince, MD.  I have reviewed the above documentation for accuracy and completeness, and I agree with the above. -  Quillian Quince, MD

## 2023-05-15 ENCOUNTER — Ambulatory Visit (INDEPENDENT_AMBULATORY_CARE_PROVIDER_SITE_OTHER): Payer: BC Managed Care – PPO | Admitting: Family Medicine

## 2023-05-15 ENCOUNTER — Encounter (INDEPENDENT_AMBULATORY_CARE_PROVIDER_SITE_OTHER): Payer: Self-pay | Admitting: Family Medicine

## 2023-05-15 VITALS — BP 140/86 | HR 83 | Temp 98.1°F | Ht 63.0 in | Wt 197.0 lb

## 2023-05-15 DIAGNOSIS — Z6834 Body mass index (BMI) 34.0-34.9, adult: Secondary | ICD-10-CM | POA: Diagnosis not present

## 2023-05-15 DIAGNOSIS — I10 Essential (primary) hypertension: Secondary | ICD-10-CM

## 2023-05-15 DIAGNOSIS — R232 Flushing: Secondary | ICD-10-CM

## 2023-05-15 DIAGNOSIS — F439 Reaction to severe stress, unspecified: Secondary | ICD-10-CM | POA: Diagnosis not present

## 2023-05-15 DIAGNOSIS — E669 Obesity, unspecified: Secondary | ICD-10-CM | POA: Diagnosis not present

## 2023-05-16 ENCOUNTER — Ambulatory Visit: Payer: BC Managed Care – PPO | Admitting: Internal Medicine

## 2023-05-21 NOTE — Progress Notes (Signed)
Chief Complaint:   OBESITY Margaret [name] is here to discuss her progress with her obesity treatment plan along with follow-up of her obesity related diagnoses. Margaret [name] is on the [employer] and states she is following her eating plan approximately 0% of the time. Margaret [name] states she is doing 0 minutes 0 times per week.  Today's visit was #: 9 Starting weight: 217 lbs Starting date: 06/14/2022 Today's weight: 197 lbs Today's date: 05/15/2023 Total lbs lost to date: 20 Total lbs lost since last in-office visit: 0  Interim History: Patient was on vacation and [place], and she did some celebration eating.  She has gained some weight but she is ready to get back on track with her weight loss efforts.  Subjective:   1. Stress Patient has had multiple stressors including changing jobs and increased traveling.  She notes increased stress and comfort eating recently.  She has not slept as well.  2. Hot flashes Patient notes increased night sweats.  She is perimenopausal and she is trying to avoid hormone replacement.   Assessment/Plan:   1. Stress Patient is to continue Wellbutrin and emotional eating behavior strategies were discussed.  2. Hot flashes Patient was advised to decrease simple carbohydrates to help decrease hot flashes.  4. BMI 34.0-34.9,adult  5. Obesity, Beginning BMI 38.44 Margaret [name] is currently in the action stage of change. As such, her goal is to get back to weightloss efforts . She has agreed to the [employer].   Behavioral modification strategies: increasing lean protein intake.  Margaret [name] has agreed to follow-up with our clinic in 4 weeks. She was informed of the importance of frequent follow-up visits to maximize her success with intensive lifestyle modifications for her multiple health conditions.   Objective:   Blood pressure (!) 140/86, pulse 83, temperature 98.1 F (36.7 C), height 5\' 3"  (1.6 m), weight 197 lb (89.4 kg), last menstrual period 09/14/2018,  SpO2 97 %. Body mass index is 34.9 kg/m.  Lab Results  Component Value Date   CREATININE 0.71 12/28/2022   BUN 11 12/28/2022   NA 140 12/28/2022   K 4.4 12/28/2022   CL 105 12/28/2022   CO2 25 12/28/2022   Lab Results  Component Value Date   ALT 12 12/28/2022   AST 9 12/28/2022   ALKPHOS 39 12/28/2022   BILITOT 0.5 12/28/2022   Lab Results  Component Value Date   HGBA1C 6.0 12/28/2022   HGBA1C 6.1 (H) 06/14/2022   HGBA1C 6.3 01/05/2022   HGBA1C 6.4 07/05/2021   HGBA1C 6.0 12/01/2020   Lab Results  Component Value Date   INSULIN 17.6 06/14/2022   Lab Results  Component Value Date   TSH 1.59 12/28/2022   Lab Results  Component Value Date   CHOL 188 12/28/2022   HDL 48.00 12/28/2022   LDLCALC 120 (H) 12/28/2022   TRIG 102.0 12/28/2022   CHOLHDL 4 12/28/2022   Lab Results  Component Value Date   VD25OH 30.09 12/28/2022   VD25OH 32.2 06/14/2022   VD25OH 11.98 (L) 01/05/2022   Lab Results  Component Value Date   WBC 8.1 12/28/2022   HGB 13.1 12/28/2022   HCT 39.3 12/28/2022   MCV 84.7 12/28/2022   PLT 297.0 12/28/2022   Lab Results  Component Value Date   IRON 36 (L) 10/13/2014   TIBC 430 10/13/2014   FERRITIN 10 10/13/2014   Attestation Statements:   Reviewed by clinician on day of visit: allergies, medications, problem list, medical history, surgical history, family  history, social history, and previous encounter notes.   I, Burt Knack, am acting as transcriptionist for Quillian Quince, MD.  I have reviewed the above documentation for accuracy and completeness, and I agree with the above. -  Quillian Quince, MD

## 2023-06-26 ENCOUNTER — Ambulatory Visit (INDEPENDENT_AMBULATORY_CARE_PROVIDER_SITE_OTHER): Payer: BC Managed Care – PPO | Admitting: Family Medicine

## 2023-07-04 ENCOUNTER — Ambulatory Visit (INDEPENDENT_AMBULATORY_CARE_PROVIDER_SITE_OTHER): Payer: BC Managed Care – PPO | Admitting: Family Medicine

## 2023-07-15 ENCOUNTER — Ambulatory Visit (INDEPENDENT_AMBULATORY_CARE_PROVIDER_SITE_OTHER): Payer: BC Managed Care – PPO | Admitting: Internal Medicine

## 2023-07-15 ENCOUNTER — Encounter: Payer: Self-pay | Admitting: Internal Medicine

## 2023-07-15 VITALS — BP 130/84 | HR 98 | Ht 63.0 in | Wt 198.0 lb

## 2023-07-15 DIAGNOSIS — K5909 Other constipation: Secondary | ICD-10-CM

## 2023-07-15 DIAGNOSIS — Z1211 Encounter for screening for malignant neoplasm of colon: Secondary | ICD-10-CM

## 2023-07-15 DIAGNOSIS — K648 Other hemorrhoids: Secondary | ICD-10-CM | POA: Diagnosis not present

## 2023-07-15 NOTE — Progress Notes (Signed)
Margaret [name] 47 y.o. Jan 03, 1976 [id number]  Assessment & Plan:   Encounter Diagnoses  Name Primary?   Colon cancer screening Yes   Prolapsed hemorrhoids    Chronic constipation    Screening colonoscopy is appropriate.  Due to concerns about prep tolerability we will provide Clenpiq samples.  Chronic hemorrhoid and constipation symptoms noted.  Will plan on functional rectal exam at the time of colonoscopy.  Some of her symptoms suggest pelvic floor dysfunction and pelvic floor PT might be helpful.  Hemorrhoidal banding at a later date could be an option as well.  Not discussed today.  Given intolerance of fiber supplements and hesitancy towards medical therapy we will hold off on that until we see what the colonoscopy shows.    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  She does have some chronic constipation issues and may not move her bowels for a few days and then have multiple stools.  She says it feels like it is almost too big to pass and she cannot generate the pressure to defecate.  She also has a hemorrhoid that will prolapse at times and she manually reduces it.  Sometimes will be slight blood from that.  She has tried FiberCon pills and other liquids but does not tolerate those well.  Will use intermittent Colace and Senokot with some benefit when needed.  She has not tried MiraLAX or prescription laxatives.  Did not have vaginal deliveries, status post C-section delivery of triplets, does not have urinary symptoms.  She did have a hysterectomy and then more recently a panniculectomy to treat chronic recurrent abdominal wall rashes due to skin excess.  That has been successful.  GI review of systems otherwise negative she does have a lifelong history of difficulty swallowing  pills. Allergies  Allergen Reactions   Dilaudid [Hydromorphone] Itching   Amoxicillin Hives and Swelling    Has patient had a PCN reaction causing immediate rash, facial/tongue/throat swelling, SOB or lightheadedness with hypotension: Yes Has patient had a PCN reaction causing severe rash involving mucus membranes or skin necrosis: No Has patient had a PCN reaction that required hospitalization: No Has patient had a PCN reaction occurring within the last 10 years: Yes If all of the above answers are "NO", then may proceed with Cephalosporin use.   Ancef [Cefazolin] Hives   Penicillins Hives and Swelling    Has patient had a PCN reaction causing immediate rash, facial/tongue/throat swelling, SOB or lightheadedness with hypotension: Yes Has patient had a PCN reaction causing severe rash involving mucus membranes or skin necrosis: No Has patient had a PCN reaction that required hospitalization: No Has patient had a PCN reaction occurring within the last 10 years: Yes If all of the above answers are "NO", then may proceed with Cephalosporin use.   Adhesive [Tape] Other (See Comments)    SKIN IRRITATION   Keflex [Cephalexin] Rash   Current Meds  Medication Sig   buPROPion (WELLBUTRIN XL) 300 MG 24 hr tablet Take 1 tablet (300 mg total) by mouth daily.   cholecalciferol (VITAMIN D3) 25 MCG (1000 UNIT) tablet Take 1,000 Units by mouth daily.   cyanocobalamin (VITAMIN B12) 500 MCG tablet Take 500 mcg by mouth daily.   metFORMIN (GLUCOPHAGE) 500 MG tablet Take 1 tablet (500 mg total) by mouth 2 (two) times daily with a meal.  valACYclovir (VALTREX) 500 MG tablet Take 1 tablet (500 mg total) by mouth 2 (two) times daily for 3 days. (Patient taking differently: Take 500 mg by mouth as needed.)   vitamin C (ASCORBIC ACID) 250 MG tablet Take 250 mg by mouth daily.   Past Medical History:  Diagnosis Date   Abrasion of right thumb 04/19/2015   Anxiety    Asthma    Back pain    Carpal tunnel  syndrome on both sides 03/2015   Complication of anesthesia    Dental crown present    Diabetes mellitus without complication (HCC)    Exercise-induced asthma    prn inhaler  seasonal   Fatigue    Headache associated with hormonal factors    Heart burn    Heart murmur    slight mummur   History of endometrial ablation    History of partial hysterectomy 2019   Hyperlipidemia    Infertility, female    Low serum iron    no current med.   Obesity    Palpitations    PONV (postoperative nausea and vomiting)    nausea only   Pre-diabetes    PVCs (premature ventricular contractions)    states are benign   Seasonal allergies    SOB (shortness of breath)    Trigger finger    Past Surgical History:  Procedure Laterality Date   ABDOMINAL HYSTERECTOMY     Laparoscopic assisted Dr. Richardean Chimera 09-22-18   BREAST REDUCTION SURGERY Bilateral 10/27/2013   Procedure: MAMMARY REDUCTION  (BREAST) BILATERAL;  Surgeon: Pleas Patricia, MD;  Location: [facility];  Service: Plastics;  Laterality: Bilateral;   CARPAL TUNNEL RELEASE Bilateral 04/22/2015   Procedure: BILATERAL CARPAL TUNNEL RELEASE;  Surgeon: Dominica Severin, MD;  Location: [facility];  Service: Orthopedics;  Laterality: Bilateral;   CESAREAN SECTION  04/23/2002   twins   CESAREAN SECTION  03/28/2005   DILATION AND CURETTAGE OF UTERUS     LAPAROSCOPIC VAGINAL HYSTERECTOMY WITH SALPINGECTOMY Bilateral 09/22/2018   Procedure: ABDOMINAL HYSTERECTOMY WITH BILATERAL SALPINGECTOMY;  Surgeon: Richardean Chimera, MD;  Location: [facility];  Service: Gynecology;  Laterality: Bilateral;  need bedL;  CONVERTED TO OPEN ABDOMINAL HYSTERECTOMY AT 0840   LASIK     PANNICULECTOMY N/A 01/09/2023   Procedure: PANNICULECTOMY WITH LIPOSUCTION;  Surgeon: Peggye Form, DO;  Location: [facility];  Service: Plastics;  Laterality: N/A;   REDUCTION MAMMAPLASTY Bilateral 2014   TRIGGER FINGER RELEASE      2019   TRIGGER FINGER RELEASE Right 2020   R middle finger   uterine ablation     02-03-2018   Social History   Social History Narrative   Married, 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   Currently working [employer] agency   1-2 caffeinated beverages rare alcohol never smoker and never drug use   family history includes Alcohol abuse in her brother, father, maternal grandfather, maternal grandmother, and paternal grandfather; Anxiety disorder in her mother; Bipolar disorder in her father; COPD in her maternal grandfather, maternal grandmother, mother, and another family member; Cancer in her maternal grandfather and another family member; Depression in her mother; Diabetes in her mother, paternal grandmother, and another family member; Drug abuse in her father; Early death in her father and mother; Heart attack in her father; Hyperlipidemia in her mother and another family member; Hypertension in her mother and another family member; Mental illness in her brother and father; Sleep  apnea in her mother; Sudden death in her father.   Review of Systems See HPI occasional back pains occasional premature heartbeat otherwise negative  Objective:   Physical Exam @BP  130/84   Pulse 98   Ht 5\' 3"  (1.6 m)   Wt 198 lb (89.8 kg)   LMP 09/14/2018 (Exact Date)   BMI 35.07 kg/m @  General:  Well-developed, well-nourished and in no acute distress Eyes:  anicteric.  Lungs: Clear to auscultation bilaterally. Heart:   S1S2, no rubs, murmurs, gallops. Abdomen:  soft, non-tender, no hepatosplenomegaly, hernia, or mass and BS+.  There is a very low suprapubic scar Rectal: Is deferred until colonoscopy Extremities:   no edema, cyanosis or clubbing Neuro:  A&O x 3.  Psych:  appropriate mood and  Affect.   Data Reviewed: See HPI

## 2023-07-15 NOTE — Patient Instructions (Signed)
You have been scheduled for a colonoscopy. Please follow written instructions given to you at your visit today.   Please pick up your prep supplies at the pharmacy within the next 1-3 days.  If you use inhalers (even only as needed), please bring them with you on the day of your procedure.  DO NOT TAKE 7 DAYS PRIOR TO TEST- Trulicity (dulaglutide) Ozempic, Wegovy (semaglutide) Mounjaro (tirzepatide) Bydureon Bcise (exanatide extended release)  DO NOT TAKE 1 DAY PRIOR TO YOUR TEST Rybelsus (semaglutide) Adlyxin (lixisenatide) Victoza (liraglutide) Byetta (exanatide) ___________________________________________________________________________  The [facility] providers would like to encourage you to use [facility] to communicate with providers for non-urgent requests or questions.  Due to long hold times on the telephone, sending your provider a message by [facility] may be a faster and more efficient way to get a response.  Please allow 48 business hours for a response.  Please remember that this is for non-urgent requests.   Due to recent changes in healthcare laws, you may see the results of your imaging and laboratory studies on MyChart before your provider has had a chance to review them.  We understand that in some cases there may be results that are confusing or concerning to you. Not all laboratory results come back in the same time frame and the provider may be waiting for multiple results in order to interpret others.  Please give [place] 48 hours in order for your provider to thoroughly review all the results before contacting the office for clarification of your results.   Thank you for choosing me and  Gastroenterology.  Iva Boop, MD., Clement[facility] Graham

## 2023-07-21 ENCOUNTER — Encounter: Payer: Self-pay | Admitting: Certified Registered Nurse Anesthetist

## 2023-07-24 ENCOUNTER — Ambulatory Visit: Payer: BC Managed Care – PPO | Admitting: Internal Medicine

## 2023-07-24 ENCOUNTER — Encounter: Payer: Self-pay | Admitting: Internal Medicine

## 2023-07-24 VITALS — BP 151/90 | HR 82 | Temp 98.0°F | Resp 19 | Ht 63.0 in | Wt 198.0 lb

## 2023-07-24 DIAGNOSIS — D120 Benign neoplasm of cecum: Secondary | ICD-10-CM

## 2023-07-24 DIAGNOSIS — Z1211 Encounter for screening for malignant neoplasm of colon: Secondary | ICD-10-CM | POA: Diagnosis present

## 2023-07-24 DIAGNOSIS — D125 Benign neoplasm of sigmoid colon: Secondary | ICD-10-CM | POA: Diagnosis not present

## 2023-07-24 DIAGNOSIS — D175 Benign lipomatous neoplasm of intra-abdominal organs: Secondary | ICD-10-CM | POA: Diagnosis not present

## 2023-07-24 DIAGNOSIS — K635 Polyp of colon: Secondary | ICD-10-CM | POA: Diagnosis not present

## 2023-07-24 MED ORDER — SODIUM CHLORIDE 0.9 % IV SOLN: 500.0000 mL | Freq: Once | INTRAVENOUS | Status: DC

## 2023-07-24 NOTE — Progress Notes (Signed)
Report given to PACU, vss 

## 2023-07-24 NOTE — Patient Instructions (Addendum)
I found and removed 2 polyps - largest 5 mm.  I saw some small hemorrhoids but nothing that looks like it is prolapsing, though that does not mean it isn't.  I am going to make a referral to pelvic floor PT - I think that will help you.  I will let you know pathology results and when to have another routine colonoscopy by mail and/or My Chart.  I appreciate the opportunity to care for you. Iva Boop, MD, [facility]   Discharge instructions given. Handouts on polyps and Hemorrhoids. Resume previous medications. Take one tablespoon on Benefiber once daily. YOU HAD AN ENDOSCOPIC PROCEDURE TODAY AT THE [facility]:   Refer to the procedure report that was given to you for any specific questions about what was found during the examination.  If the procedure report does not answer your questions, please call your gastroenterologist to clarify.  If you requested that your care partner not be given the details of your procedure findings, then the procedure report has been included in a sealed envelope for you to review at your convenience later.  YOU SHOULD EXPECT: Some feelings of bloating in the abdomen. Passage of more gas than usual.  Walking can help get rid of the air that was put into your GI tract during the procedure and reduce the bloating. If you had a lower endoscopy (such as a colonoscopy or flexible sigmoidoscopy) you may notice spotting of blood in your stool or on the toilet paper. If you underwent a bowel prep for your procedure, you may not have a normal bowel movement for a few days.  Please Note:  You might notice some irritation and congestion in your nose or some drainage.  This is from the oxygen used during your procedure.  There is no need for concern and it should clear up in a day or so.  SYMPTOMS TO REPORT IMMEDIATELY:  Following lower endoscopy (colonoscopy or flexible sigmoidoscopy):  Excessive amounts of blood in the stool  Significant tenderness or  worsening of abdominal pains  Swelling of the abdomen that is new, acute  Fever of 100F or higher   For urgent or emergent issues, a gastroenterologist can be reached at any hour by calling [phone]. Do not use MyChart messaging for urgent concerns.    DIET:  We do recommend a small meal at first, but then you may proceed to your regular diet.  Drink plenty of fluids but you should avoid alcoholic beverages for 24 hours.  ACTIVITY:  You should plan to take it easy for the rest of today and you should NOT DRIVE or use heavy machinery until tomorrow (because of the sedation medicines used during the test).    FOLLOW UP: Our staff will call the number listed on your records the next business day following your procedure.  We will call around 7:15- 8:00 am to check on you and address any questions or concerns that you may have regarding the information given to you following your procedure. If we do not reach you, we will leave a message.     If any biopsies were taken you will be contacted by phone or by letter within the next 1-3 weeks.  Please call us at [phone] if you have not heard about the biopsies in 3 weeks.    SIGNATURES/CONFIDENTIALITY: You and/or your care partner have signed paperwork which will be entered into your electronic medical record.  These signatures attest to the fact that that  the information above on your After Visit Summary has been reviewed and is understood.  Full responsibility of the confidentiality of this discharge information lies with you and/or your care-partner.

## 2023-07-24 NOTE — Op Note (Addendum)
[facility] Patient Name: Margaret [name] Procedure Date: 07/24/2023 3:50 PM MRN: [id number] Endoscopist: Iva Boop , MD, [id number] Age: 47 Referring MD:  Date of Birth: [birth year] Gender: Female Account #: [id number] Procedure:                Colonoscopy Indications:              Screening for colorectal malignant neoplasm, This                            is the patient's first colonoscopy Medicines:                Monitored Anesthesia Care Procedure:                Pre-Anesthesia Assessment:                           - Prior to the procedure, a History and Physical                            was performed, and patient medications and                            allergies were reviewed. The patient's tolerance of                            previous anesthesia was also reviewed. The risks                            and benefits of the procedure and the sedation                            options and risks were discussed with the patient.                            All questions were answered, and informed consent                            was obtained. Prior Anticoagulants: The patient has                            taken no anticoagulant or antiplatelet agents. ASA                            Grade Assessment: II - A patient with mild systemic                            disease. After reviewing the risks and benefits,                            the patient was deemed in satisfactory condition to                            undergo the procedure.  After obtaining informed consent, the colonoscope                            was passed under direct vision. Throughout the                            procedure, the patient's blood pressure, pulse, and                            oxygen saturations were monitored continuously. The                            CF [id number] #[id number] was introduced through the anus                            and advanced to the  the cecum, identified by                            appendiceal orifice and ileocecal valve. The                            colonoscopy was performed without difficulty. The                            patient tolerated the procedure well. The quality                            of the bowel preparation was adequate. The bowel                            preparation used was Clenpiq via split dose                            instruction. The ileocecal valve, appendiceal                            orifice, and rectum were photographed. Scope In: 4:00:24 PM Scope Out: 4:20:47 PM Scope Withdrawal Time: 0 hours 15 minutes 36 seconds  Total Procedure Duration: 0 hours 20 minutes 23 seconds  Findings:                 The digital rectal exam findings include small                            fleshy tag, and normal functional rectal exam.                           Two sessile polyps were found in the sigmoid colon                            and cecum. The polyps were 2 to 5 mm in size. These                            polyps were  removed with a cold snare. Resection                            and retrieval were complete. Verification of                            patient identification for the specimen was done.                            Estimated blood loss was minimal.                           External and internal hemorrhoids were found. The                            hemorrhoids were small.                           The exam was otherwise without abnormality on                            direct and retroflexion views. Complications:            No immediate complications. Estimated Blood Loss:     Estimated blood loss was minimal. Impression:               - Small fleshy tag, and normal functional rectal                            exam. found on digital rectal exam.                           - Two 2 to 5 mm polyps in the sigmoid colon and in                            the cecum, removed with a  cold snare. Resected and                            retrieved.                           - External and internal hemorrhoids. Small - none                            that look like they are prolapsing though does not                            rule out                           - The examination was otherwise normal on direct                            and retroflexion views. Adequate prep with  significant lavage. Recommendation:           - Patient has a contact number available for                            emergencies. The signs and symptoms of potential                            delayed complications were discussed with the                            patient. Return to normal activities tomorrow.                            Written discharge instructions were provided to the                            patient.                           - Resume previous diet.                           - Continue present medications.                           - Await pathology results.                           - Repeat colonoscopy is recommended. The                            colonoscopy date will be determined after pathology                            results from today's exam become available for                            review. Would consider differnt vs extra prep next                            time.                           - beneifiber 1 tbsp qd, drink plenty of water/fluids                           - MY OFFICE WILL REFER FOR PELVIC FLOOR PT -                            CONSTIPATION AND DEFECATION DIFFICULTY Iva Boop, MD 07/24/2023 4:33:22 PM This report has been signed electronically.

## 2023-07-24 NOTE — Progress Notes (Signed)
Called to room to assist during endoscopic procedure.  Patient ID and intended procedure confirmed with present staff. Received instructions for my participation in the procedure from the performing physician.  

## 2023-07-24 NOTE — Progress Notes (Signed)
History and Physical Interval Note:  07/24/2023 3:53 PM  Margaret [name]  has presented today for endoscopic procedure(s), with the diagnosis of  Encounter Diagnosis  Name Primary?   Colon cancer screening Yes  .  The various methods of evaluation and treatment have been discussed with the patient and/or family. After consideration of risks, benefits and other options for treatment, the patient has consented to  the endoscopic procedure(s).   The patient's history has been reviewed, patient examined, no change in status, stable for endoscopic procedure(s).  I have reviewed the patient's chart and labs.  Questions were answered to the patient's satisfaction.     Iva Boop, MD, [facility]

## 2023-07-25 ENCOUNTER — Telehealth: Payer: Self-pay | Admitting: *Deleted

## 2023-07-25 ENCOUNTER — Telehealth: Payer: Self-pay

## 2023-07-25 DIAGNOSIS — K5909 Other constipation: Secondary | ICD-10-CM

## 2023-07-25 DIAGNOSIS — K5902 Outlet dysfunction constipation: Secondary | ICD-10-CM

## 2023-07-25 NOTE — Telephone Encounter (Signed)
Orders have been placed for pelvic floor PT # [phone]. Patient informed to wait for them to contact her. She has the # to call them if she misses the call.

## 2023-07-25 NOTE — Telephone Encounter (Signed)
  Follow up Call-     07/24/2023    3:10 PM  Call back number  Post procedure Call Back phone  # [phone]  Permission to leave phone message Yes     Patient questions:  Do you have a fever, pain , or abdominal swelling? No. Pain Score  0 *  Have you tolerated food without any problems? Yes.    Have you been able to return to your normal activities? Yes.    Do you have any questions about your discharge instructions: Diet   No. Medications  No. Follow up visit  No.  Do you have questions or concerns about your Care? No.  Actions: * If pain score is 4 or above: No action needed, pain <4.

## 2023-07-30 ENCOUNTER — Encounter: Payer: Self-pay | Admitting: Internal Medicine

## 2023-07-30 DIAGNOSIS — Z860100 Personal history of colon polyps, unspecified: Secondary | ICD-10-CM | POA: Insufficient documentation

## 2023-07-30 HISTORY — DX: Personal history of colon polyps, unspecified: Z86.0100

## 2023-09-17 ENCOUNTER — Other Ambulatory Visit: Payer: Self-pay | Admitting: Obstetrics and Gynecology

## 2023-09-17 DIAGNOSIS — Z1231 Encounter for screening mammogram for malignant neoplasm of breast: Secondary | ICD-10-CM

## 2023-10-16 ENCOUNTER — Other Ambulatory Visit: Payer: Self-pay

## 2023-10-16 ENCOUNTER — Ambulatory Visit: Payer: BC Managed Care – PPO | Attending: Internal Medicine

## 2023-10-16 DIAGNOSIS — K5902 Outlet dysfunction constipation: Secondary | ICD-10-CM | POA: Diagnosis not present

## 2023-10-16 DIAGNOSIS — R279 Unspecified lack of coordination: Secondary | ICD-10-CM | POA: Diagnosis not present

## 2023-10-16 DIAGNOSIS — R293 Abnormal posture: Secondary | ICD-10-CM | POA: Insufficient documentation

## 2023-10-16 DIAGNOSIS — M6281 Muscle weakness (generalized): Secondary | ICD-10-CM | POA: Diagnosis not present

## 2023-10-16 DIAGNOSIS — K5909 Other constipation: Secondary | ICD-10-CM | POA: Insufficient documentation

## 2023-10-16 DIAGNOSIS — M62838 Other muscle spasm: Secondary | ICD-10-CM | POA: Diagnosis present

## 2023-10-16 NOTE — Therapy (Signed)
OUTPATIENT PHYSICAL THERAPY FEMALE PELVIC EVALUATION   Patient Name: Margaret [name] MRN: [id number] DOB:12-13-1975, 47 y.o., female Today's Date: 10/16/2023  END OF SESSION:  PT End of Session - 10/16/23 0930     Visit Number 1    Date for PT Re-Evaluation 04/01/24    Authorization Type [employer]    PT Start Time 0930    PT Stop Time 1010    PT Time Calculation (min) 40 min    Activity Tolerance Patient tolerated treatment well    Behavior During Therapy [facility] for tasks assessed/performed             Past Medical History:  Diagnosis Date   Abrasion of right thumb 04/19/2015   Anxiety    Anxiety and depression    Asthma    Back pain    Carpal tunnel syndrome on both sides 03/2015   Complication of anesthesia    Dental crown present    Diabetes mellitus without complication (HCC)    Exercise-induced asthma    prn inhaler  seasonal   Fatigue    Headache associated with hormonal factors    Heart burn    Heart murmur    slight mummur   History of endometrial ablation    History of partial hysterectomy 2019   Hx of sessile serrated colonic polyp 07/30/2023   Hyperlipidemia    Infertility, female    Low serum iron    no current med.   Obesity    Palpitations    PONV (postoperative nausea and vomiting)    nausea only   Pre-diabetes    PVCs (premature ventricular contractions)    states are benign   Seasonal allergies    SOB (shortness of breath)    Trigger finger    Past Surgical History:  Procedure Laterality Date   ABDOMINAL HYSTERECTOMY     Laparoscopic assisted Dr. Richardean Chimera 09-22-18   BREAST REDUCTION SURGERY Bilateral 10/27/2013   Procedure: MAMMARY REDUCTION  (BREAST) BILATERAL;  Surgeon: Pleas Patricia, MD;  Location: [facility];  Service: Plastics;  Laterality: Bilateral;   CARPAL TUNNEL RELEASE Bilateral 04/22/2015   Procedure: BILATERAL CARPAL TUNNEL RELEASE;  Surgeon: Dominica Severin, MD;  Location: [facility];   Service: Orthopedics;  Laterality: Bilateral;   CESAREAN SECTION  04/23/2002   twins   CESAREAN SECTION  03/28/2005   DILATION AND CURETTAGE OF UTERUS     LAPAROSCOPIC VAGINAL HYSTERECTOMY WITH SALPINGECTOMY Bilateral 09/22/2018   Procedure: ABDOMINAL HYSTERECTOMY WITH BILATERAL SALPINGECTOMY;  Surgeon: Richardean Chimera, MD;  Location: [facility];  Service: Gynecology;  Laterality: Bilateral;  need bedL;  CONVERTED TO OPEN ABDOMINAL HYSTERECTOMY AT 0840   LASIK     PANNICULECTOMY N/A 01/09/2023   Procedure: PANNICULECTOMY WITH LIPOSUCTION;  Surgeon: Peggye Form, DO;  Location: [facility];  Service: Plastics;  Laterality: N/A;   REDUCTION MAMMAPLASTY Bilateral 2014   TRIGGER FINGER RELEASE     2019   TRIGGER FINGER RELEASE Right 2020   R middle finger   uterine ablation     02-03-2018   Patient Active Problem List   Diagnosis Date Noted   Hx of sessile serrated colonic polyp 07/30/2023   Stress 05/15/2023   Hot flashes 05/15/2023   Primary hypertension 05/15/2023   Absolute anemia 12/28/2022   Chronic constipation 12/28/2022   External hemorrhoid 12/28/2022   Mixed hyperlipidemia 12/28/2022   Overactive bladder 12/28/2022   Other constipation 12/18/2022   BMI 34.0-34.9,adult 12/18/2022  Obesity, Beginning BMI 38.44 12/18/2022   Candidiasis, cutaneous 09/18/2022   Depression 08/21/2022   Prediabetes 07/18/2022   Other fatigue 06/14/2022   SOB (shortness of breath) on exertion 06/14/2022   Vitamin D deficiency 06/14/2022   Vitamin B 12 deficiency 06/14/2022   Health care maintenance 06/14/2022   Abdominal pannus 06/14/2022   Class 2 severe obesity with serious comorbidity and body mass index (BMI) of 38.0 to 38.9 in adult [facility]) 06/14/2022   Panniculitis 02/23/2022   Chronic low back pain 02/11/2019   Pre-diabetes 02/03/2019   Hypercholesteremia 02/03/2019   Bipolar 1 disorder (HCC) 12/31/2018   Depression, recurrent (HCC) 12/31/2018    Intermittent palpitations 11/15/2014    PCP: Allwardt, Crist Infante, PA-C  REFERRING PROVIDER: Iva Boop, MD  REFERRING DIAG: K59.09 (ICD-10-CM) - Chronic constipation K59.02 (ICD-10-CM) - Dyssynergic defecation  THERAPY DIAG:  Other muscle spasm  Unspecified lack of coordination  Muscle weakness (generalized)  Abnormal posture  Rationale for Evaluation and Treatment: Rehabilitation  ONSET DATE: since child  SUBJECTIVE:                                                                                                                                                                                           SUBJECTIVE STATEMENT: Pt states that she has had constipation her entire life. She has never been regular. She feels like she has hemorrhoids, but unconfirmed by MD. She feels like she has to wait to long to have bowel movement and then they are so big they are difficult to get out. No current exercise program. Has difficulty with getting enough fiber.  Fluid intake: Yes: not enough - maybe 24 oz of fluid that is normally not water (soda or  tea)    PAIN:  Are you having pain? Yes NPRS scale: 0/10 currently, 5/10 worst Pain location:  low back pain  Pain type: aching Pain description: intermittent   Aggravating factors: prolonged standing Relieving factors: movement/rocking   PRECAUTIONS: None  RED FLAGS: None   WEIGHT BEARING RESTRICTIONS: No  FALLS:  Has patient fallen in last 6 months? No  LIVING ENVIRONMENT: Lives with: lives with their family Lives in: House/apartment  OCCUPATION: Nurse  PLOF: Independent  PATIENT GOALS: improve ease and frequency of bowel movements   PERTINENT HISTORY:  Hysterectomy, paniculectomy   BOWEL MOVEMENT: Pain with bowel movement: Yes - very large  Type of bowel movement:Frequency 2-3 days, then some days 2-3x a day and Strain Yes - has had to dig bowel movement out sometimes  Fully empty rectum: Yes: some  days Leakage: No Pads: No Fiber supplement: No  URINATION: Pain with  urination: No Fully empty bladder: Yes: - Stream: Strong Urgency: No Frequency: 4-5x/day; no nocturia Leakage:  no consistent issues  Pads: No  INTERCOURSE: Pain with intercourse: Initial Penetration - dryness  Ability to have vaginal penetration:  Yes: - Climax: WNL Marinoff Scale: 1/3  PREGNANCY: Vaginal deliveries 0 Tearing No C-section deliveries 2 Currently pregnant No  PROLAPSE: None   OBJECTIVE:  Note: Objective measures were completed at Evaluation unless otherwise noted.  10/16/23: COGNITION: Overall cognitive status: Within functional limits for tasks assessed     SENSATION: Light touch: Appears intact Proprioception: Appears intact   FUNCTIONAL TESTS:   Single leg stance:   Rt: Lt pelvic drop   Lt: Rt pelvic drop   Squat: Rt trunk rotation GAIT: Comments: WNL  POSTURE: rounded shoulders, forward head, decreased lumbar lordosis, decreased thoracic kyphosis, and Rt iliac crest elevation  PELVIC ALIGNMENT: Lt posterior rotation   LUMBARAROM/PROM:  A/PROM A/PROM  Eval (% available)  Flexion 75  Extension 25  Right lateral flexion 50  Left lateral flexion 75  Right rotation 75  Left rotation 75   (Blank rows = not tested)    PALPATION:   General  Abdominal scar tissue restriction; tenderness midline low back                External Perineal Exam skin tags where hemorrhoids were present                             Internal Pelvic Floor pain and tenderness with deep pelvic floor palpated rectally, Lt>Rt  Patient confirms identification and approves PT to assess internal pelvic floor and treatment Yes  PELVIC MMT:   MMT eval  Internal Anal Sphincter 3/5  External Anal Sphincter 3/5, 3 second hold, 5 repeat contractions  Puborectalis 3/5, 3 second hold, 5 repeat contractions  (Blank rows = not tested)        TONE: High, Lt>Rt  PROLAPSE: Not evaluated  today  TODAY'S TREATMENT:                                                                                                                              DATE:  10/16/23  EVAL  Therapeutic activities: Squatty potty Relaxed toilet mechanics Increased water intake  Self- bowel massage Strict bowel retraining program with eating more fiber Balloon breathing while trying to have bowel movement     PATIENT EDUCATION:  Education details: See above Person educated: Patient Education method: Programmer, multimedia, Demonstration, Tactile cues, Verbal cues, and Handouts Education comprehension: verbalized understanding  HOME EXERCISE PROGRAM: Written handout  ASSESSMENT:  CLINICAL IMPRESSION: Patient is a 48 y.o. female who was seen today for physical therapy evaluation and treatment for chronic constipation. Exam findings notable for abnormal posture, decreased lumbar A/ROM, pelvic rotation, decreased functional core strength with single leg stance and squat, abdominal scar tissue restriction, tenderness in low back, pelvic floor weakness/decreased endurance, poor  pelvic floor coordination with bearing down, and high tone in pelvic floor. Signs and symptoms are most consistent with poor lifestyle habits that are increasing constipation, high tone pelvic floor, and decreased pelvic floor muscle coordination. She did well with initial treatment of education on bowel retraining, use of squatty potty, relaxed toilet mechanics, balloon breathing, and self-bowel massage. She will continue to benefit from skilled PT intervention in order to improve ease of bowel movements, increase ease of bowel movements, improve tone of pelvic floor, and begin/progress functional strengthening program.   OBJECTIVE IMPAIRMENTS: decreased activity tolerance, decreased coordination, decreased endurance, decreased strength, increased fascial restrictions, increased muscle spasms, impaired tone, and pain.   ACTIVITY LIMITATIONS:   bowel movements   PARTICIPATION LIMITATIONS: community activity and occupation  PERSONAL FACTORS: 1 comorbidity: medical history  are also affecting patient's functional outcome.   REHAB POTENTIAL: Good  CLINICAL DECISION MAKING: Stable/uncomplicated  EVALUATION COMPLEXITY: Low   GOALS: Goals reviewed with patient? Yes  SHORT TERM GOALS: Target date: 11/13/23  Pt will be independent with HEP.   Baseline: Goal status: INITIAL  2.  Pt will be able to demonstrate appropriate pelvic floor muscle lengthening with exhale as she gently bears down like having a bowel movement.  Baseline:  Goal status: INITIAL  3.  Pt will demonstrate normal pelvic floor muscle tone/length.  Baseline:  Goal status: INITIAL  4.  Pt will be independent with use of squatty potty, relaxed toileting mechanics, and improved bowel movement techniques in order to increase ease of bowel movements and complete evacuation.   Baseline:  Goal status: INITIAL  5.  Pt will be independent with diaphragmatic breathing and down training activities in order to improve pelvic floor relaxation.  Baseline:  Goal status: INITIAL    LONG TERM GOALS: Target date: 04/01/24  Pt will be independent with advanced HEP.   Baseline:  Goal status: INITIAL  2.  Pt will increase bowel movement frequency to every other day.  Baseline:  Goal status: INITIAL  3.  Pt will be able to have bowel movement without straining with use of balloon breathing. Baseline:  Goal status: INITIAL  4.  Pt will report 0/10 pain with bowel movements.  Baseline:  Goal status: INITIAL  5.  Pt will consistently drink over 50oz of water a day and eat 8g of fiber with each meal to help improve ease of bowel movements.  Baseline:  Goal status: INITIAL  6.  Pt will get at least 60 minutes of exercise a week in order to improve ease and regularity of bowel movements.  Baseline:  Goal status: INITIAL  PLAN:  PT FREQUENCY: 1-2x/week  PT  DURATION: 6 months  PLANNED INTERVENTIONS: 97110-Therapeutic exercises, 97530- Therapeutic activity, 97112- Neuromuscular re-education, 97535- Self Care, 04540- Manual therapy, Dry Needling, and Biofeedback  PLAN FOR NEXT SESSION: Address abdominal scar tissue; mobility and down training exercises. Work on diaphragmatic breathing.    Julio Alm, PT, DPT11/20/2410:20 AM

## 2023-10-16 NOTE — Patient Instructions (Signed)
Bowel retraining:  8-12 grams of fiber with each meal After each meal, go attempt to have a bowel movement within 5 minutes and don't sit on the toilet for more than 5 minutes    Squatty potty: When your knees are level or below the level of your hips, pelvic floor muscles are pressed against rectum, preventing ease of bowel movement. By getting knees above the level of the hips, these pelvic floor muscles relax, allowing easier passage of bowel movement. Ways to get knees above hips: o Squatty Potty (7inch and 9inch versions) o Small stool o Roll of toilet paper under each foot o Hardback book or stack of magazines under each foot  Relaxed Toileting mechanics: Once in this position, make sure to lean forward with forearms on thighs, wide knees, relaxed stomach, and breathe.   Balloon Breathing: Exhale and forcefully exhale through the mouth instead of bearing down and pushing with bowel movements    Bowel massage: To assist with more regular and more comfortable bowel movements, try performing bowel massage nightly for 5-10 minutes. Place hands in the lower right side of your abdomen to start; in small circles, massage up, across, and down the left side of your abdomen. Pressure does not need to be hard, but just comfortable. You can use lotion or oil to make more comfortable.

## 2023-10-22 ENCOUNTER — Other Ambulatory Visit: Payer: Self-pay | Admitting: Physician Assistant

## 2023-10-22 ENCOUNTER — Ambulatory Visit: Payer: BC Managed Care – PPO

## 2023-10-23 ENCOUNTER — Other Ambulatory Visit: Payer: Self-pay

## 2023-10-23 MED ORDER — VALACYCLOVIR HCL 500 MG PO TABS: 500.0000 mg | ORAL_TABLET | Freq: Two times a day (BID) | ORAL | 1 refills | Status: DC

## 2023-10-30 ENCOUNTER — Ambulatory Visit (INDEPENDENT_AMBULATORY_CARE_PROVIDER_SITE_OTHER): Payer: BC Managed Care – PPO | Admitting: Family

## 2023-10-30 ENCOUNTER — Other Ambulatory Visit (HOSPITAL_BASED_OUTPATIENT_CLINIC_OR_DEPARTMENT_OTHER): Payer: Self-pay

## 2023-10-30 VITALS — BP 133/88 | HR 112 | Temp 98.0°F | Ht 63.0 in | Wt 206.2 lb

## 2023-10-30 DIAGNOSIS — J069 Acute upper respiratory infection, unspecified: Secondary | ICD-10-CM

## 2023-10-30 DIAGNOSIS — B009 Herpesviral infection, unspecified: Secondary | ICD-10-CM

## 2023-10-30 DIAGNOSIS — L247 Irritant contact dermatitis due to plants, except food: Secondary | ICD-10-CM | POA: Diagnosis not present

## 2023-10-30 MED ORDER — VALACYCLOVIR HCL 500 MG PO TABS: 500.0000 mg | ORAL_TABLET | Freq: Two times a day (BID) | ORAL | 1 refills | Status: DC

## 2023-10-30 MED ORDER — BENZONATATE 200 MG PO CAPS
200.0000 mg | ORAL_CAPSULE | Freq: Three times a day (TID) | ORAL | 0 refills | Status: AC | PRN
  Filled 2023-10-30: qty 30, 10d supply, fill #0

## 2023-10-30 MED ORDER — PREDNISONE 20 MG PO TABS
ORAL_TABLET | ORAL | 0 refills | Status: AC
  Filled 2023-10-30: qty 8, 5d supply, fill #0

## 2023-10-30 NOTE — Progress Notes (Signed)
Patient ID: Margaret [name], female    DOB: Jun 17, 1976, 47 y.o.   MRN: [id number]  Chief Complaint  Patient presents with   Sinus Problem    Pt c/o nasal congestion and cough, Present for 3 weeks. Ha tried sudafed and zyrtec, sx resolved but after flu shot sx came back and fever.    Rash    Pt c/o rash is generalized, has resolved.    Discussed the use of AI scribe software for clinical note transcription with the patient, who gave verbal consent to proceed.  History of Present Illness   The patient, with a history of allergies, presents with a rash, cough, and fever. She first noticed the rash three weeks ago after working in her yard. Despite knowing what poison ivy and poison oak look like, she suspects she came into contact with an unknown allergen. The rash, initially widespread, has improved but persists in a few areas. Concurrent with the rash, she developed a cough, which she initially managed with Zyrtec and an out-of-date inhaler. The cough improved, but returned after receiving the flu shot. She also developed nasal congestion, which has led to snoring and disrupted sleep. She has tried Dayquil and Nyquil, but continues to cough, particularly when lying down.  In addition to these symptoms, she also had a fever, which she believes triggered a herpes simplex outbreak. She typically manages these outbreaks with Valtrex, but her prescription has run out.            Assessment & Plan:     Contact Dermatitis - Recurrent rash after yard work, likely allergic reaction to unidentified plant. Tried various topical treatments with partial improvement. -Consider using Technu scrub after yard work to remove the irritant oils off the skin. -Sending prednisone oral pack if rash worsens, advised on use & SE. -RTO precautions provided.  Upper Respiratory Infection - Cough, nasal congestion, and intermittent fever for approximately 3 weeks. No chest tightness or wheezing. Lungs clear on  auscultation. -Continue current symptomatic treatment with Dayquil and Nyquil. -Consider use of Afrin for only a few days to relieve nasal congestion at night. -Sending Tessalon Pearls to help suppress cough. -Use saline spray and humidifier for symptomatic relief. -RTO precautions provided.  Herpes Labialis - Recurrent cold sores, likely triggered by fever and current virus. -Refill Valtrex prescription via mail order for 3 months supply. -Initiate treatment at first sign of tingling sensation.    Subjective:    Outpatient Medications Prior to Visit  Medication Sig Dispense Refill   buPROPion (WELLBUTRIN XL) 300 MG 24 hr tablet TAKE 1 TABLET DAILY 90 tablet 0   cholecalciferol (VITAMIN D3) 25 MCG (1000 UNIT) tablet Take 1,000 Units by mouth daily.     cyanocobalamin (VITAMIN B12) 500 MCG tablet Take 500 mcg by mouth daily.     metFORMIN (GLUCOPHAGE) 500 MG tablet Take 1 tablet (500 mg total) by mouth 2 (two) times daily with a meal. 180 tablet 0   valACYclovir (VALTREX) 500 MG tablet Take 1 tablet (500 mg total) by mouth 2 (two) times daily for 3 days. 30 tablet 1   vitamin C (ASCORBIC ACID) 250 MG tablet Take 250 mg by mouth daily.     No facility-administered medications prior to visit.   Past Medical History:  Diagnosis Date   Abrasion of right thumb 04/19/2015   Anxiety    Anxiety and depression    Asthma    Back pain    Carpal tunnel syndrome on both sides  03/2015   Complication of anesthesia    Dental crown present    Diabetes mellitus without complication (HCC)    Exercise-induced asthma    prn inhaler  seasonal   Fatigue    Headache associated with hormonal factors    Heart burn    Heart murmur    slight mummur   History of endometrial ablation    History of partial hysterectomy 2019   Hx of sessile serrated colonic polyp 07/30/2023   Hyperlipidemia    Infertility, female    Low serum iron    no current med.   Obesity    Palpitations    PONV (postoperative  nausea and vomiting)    nausea only   Pre-diabetes    PVCs (premature ventricular contractions)    states are benign   Seasonal allergies    SOB (shortness of breath)    Trigger finger    Past Surgical History:  Procedure Laterality Date   ABDOMINAL HYSTERECTOMY     Laparoscopic assisted Dr. Richardean Chimera 09-22-18   BREAST REDUCTION SURGERY Bilateral 10/27/2013   Procedure: MAMMARY REDUCTION  (BREAST) BILATERAL;  Surgeon: Pleas Patricia, MD;  Location: [facility];  Service: Plastics;  Laterality: Bilateral;   CARPAL TUNNEL RELEASE Bilateral 04/22/2015   Procedure: BILATERAL CARPAL TUNNEL RELEASE;  Surgeon: Dominica Severin, MD;  Location: [facility];  Service: Orthopedics;  Laterality: Bilateral;   CESAREAN SECTION  04/23/2002   twins   CESAREAN SECTION  03/28/2005   DILATION AND CURETTAGE OF UTERUS     LAPAROSCOPIC VAGINAL HYSTERECTOMY WITH SALPINGECTOMY Bilateral 09/22/2018   Procedure: ABDOMINAL HYSTERECTOMY WITH BILATERAL SALPINGECTOMY;  Surgeon: Richardean Chimera, MD;  Location: [facility];  Service: Gynecology;  Laterality: Bilateral;  need bedL;  CONVERTED TO OPEN ABDOMINAL HYSTERECTOMY AT 0840   LASIK     PANNICULECTOMY N/A 01/09/2023   Procedure: PANNICULECTOMY WITH LIPOSUCTION;  Surgeon: Peggye Form, DO;  Location: [facility];  Service: Plastics;  Laterality: N/A;   REDUCTION MAMMAPLASTY Bilateral 2014   TRIGGER FINGER RELEASE     2019   TRIGGER FINGER RELEASE Right 2020   R middle finger   uterine ablation     02-03-2018   Allergies  Allergen Reactions   Dilaudid [Hydromorphone] Itching   Amoxicillin Hives and Swelling    Has patient had a PCN reaction causing immediate rash, facial/tongue/throat swelling, SOB or lightheadedness with hypotension: Yes Has patient had a PCN reaction causing severe rash involving mucus membranes or skin necrosis: No Has patient had a PCN reaction that required hospitalization:  No Has patient had a PCN reaction occurring within the last 10 years: Yes If all of the above answers are "NO", then may proceed with Cephalosporin use.   Ancef [Cefazolin] Hives   Penicillins Hives and Swelling    Has patient had a PCN reaction causing immediate rash, facial/tongue/throat swelling, SOB or lightheadedness with hypotension: Yes Has patient had a PCN reaction causing severe rash involving mucus membranes or skin necrosis: No Has patient had a PCN reaction that required hospitalization: No Has patient had a PCN reaction occurring within the last 10 years: Yes If all of the above answers are "NO", then may proceed with Cephalosporin use.   Adhesive [Tape] Other (See Comments)    SKIN IRRITATION   Keflex [Cephalexin] Rash      Objective:    Physical Exam Vitals and nursing note reviewed.  Constitutional:      Appearance: Normal appearance. She  is ill-appearing.     Interventions: Face mask in place.  HENT:     Right Ear: Tympanic membrane and ear canal normal.     Left Ear: Tympanic membrane and ear canal normal.     Nose:     Right Sinus: Frontal sinus tenderness present.     Left Sinus: Frontal sinus tenderness present.     Mouth/Throat:     Mouth: Mucous membranes are moist.     Pharynx: Posterior oropharyngeal erythema present. No pharyngeal swelling, oropharyngeal exudate or uvula swelling.     Tonsils: No tonsillar exudate or tonsillar abscesses.  Cardiovascular:     Rate and Rhythm: Normal rate and regular rhythm.  Pulmonary:     Effort: Pulmonary effort is normal.     Breath sounds: Normal breath sounds.  Musculoskeletal:        General: Normal range of motion.  Lymphadenopathy:     Head:     Right side of head: No preauricular or posterior auricular adenopathy.     Left side of head: No preauricular or posterior auricular adenopathy.     Cervical: No cervical adenopathy.  Skin:    General: Skin is warm and dry.     Findings: Lesion (right side upper  lip, mild crusting) and rash (small pinkish bumps on bilateral arms, poison oak type lesion noted on left wrist) present.  Neurological:     Mental Status: She is alert.  Psychiatric:        Mood and Affect: Mood normal.        Behavior: Behavior normal.    BP 133/88 (BP Location: Left Arm)   Pulse (!) 112   Temp 98 F (36.7 C) (Temporal)   Ht 5\' 3"  (1.6 m)   Wt 206 lb 3.2 oz (93.5 kg)   LMP 09/14/2018 (Exact Date)   SpO2 98%   BMI 36.53 kg/m  Wt Readings from Last 3 Encounters:  10/30/23 206 lb 3.2 oz (93.5 kg)  07/24/23 198 lb (89.8 kg)  07/15/23 198 lb (89.8 kg)       Dulce Sellar, NP

## 2023-11-06 ENCOUNTER — Ambulatory Visit: Payer: BC Managed Care – PPO | Attending: Internal Medicine

## 2023-11-06 DIAGNOSIS — M6281 Muscle weakness (generalized): Secondary | ICD-10-CM | POA: Insufficient documentation

## 2023-11-06 DIAGNOSIS — M62838 Other muscle spasm: Secondary | ICD-10-CM | POA: Diagnosis present

## 2023-11-06 DIAGNOSIS — R293 Abnormal posture: Secondary | ICD-10-CM | POA: Insufficient documentation

## 2023-11-06 DIAGNOSIS — R279 Unspecified lack of coordination: Secondary | ICD-10-CM | POA: Diagnosis present

## 2023-11-06 NOTE — Therapy (Signed)
OUTPATIENT PHYSICAL THERAPY FEMALE PELVIC EVALUATION   Patient Name: Margaret [name] MRN: [id number] DOB:24-Sep-1976, 47 y.o., female Today's Date: 11/06/2023  END OF SESSION:  PT End of Session - 11/06/23 0932     Visit Number 2    Date for PT Re-Evaluation 04/01/24    Authorization Type BC[employer]    PT Start Time 0930    PT Stop Time 1010    PT Time Calculation (min) 40 min    Activity Tolerance Patient tolerated treatment well    Behavior During Therapy [facility] for tasks assessed/performed             Past Medical History:  Diagnosis Date   Abrasion of right thumb 04/19/2015   Anxiety    Anxiety and depression    Asthma    Back pain    Carpal tunnel syndrome on both sides 03/2015   Complication of anesthesia    Dental crown present    Diabetes mellitus without complication (HCC)    Exercise-induced asthma    prn inhaler  seasonal   Fatigue    Headache associated with hormonal factors    Heart burn    Heart murmur    slight mummur   History of endometrial ablation    History of partial hysterectomy 2019   Hx of sessile serrated colonic polyp 07/30/2023   Hyperlipidemia    Infertility, female    Low serum iron    no current med.   Obesity    Palpitations    PONV (postoperative nausea and vomiting)    nausea only   Pre-diabetes    PVCs (premature ventricular contractions)    states are benign   Seasonal allergies    SOB (shortness of breath)    Trigger finger    Past Surgical History:  Procedure Laterality Date   ABDOMINAL HYSTERECTOMY     Laparoscopic assisted Dr. Richardean Chimera 09-22-18   BREAST REDUCTION SURGERY Bilateral 10/27/2013   Procedure: MAMMARY REDUCTION  (BREAST) BILATERAL;  Surgeon: Pleas Patricia, MD;  Location: [facility];  Service: Plastics;  Laterality: Bilateral;   CARPAL TUNNEL RELEASE Bilateral 04/22/2015   Procedure: BILATERAL CARPAL TUNNEL RELEASE;  Surgeon: Dominica Severin, MD;  Location: [facility];   Service: Orthopedics;  Laterality: Bilateral;   CESAREAN SECTION  04/23/2002   twins   CESAREAN SECTION  03/28/2005   DILATION AND CURETTAGE OF UTERUS     LAPAROSCOPIC VAGINAL HYSTERECTOMY WITH SALPINGECTOMY Bilateral 09/22/2018   Procedure: ABDOMINAL HYSTERECTOMY WITH BILATERAL SALPINGECTOMY;  Surgeon: Richardean Chimera, MD;  Location: [facility];  Service: Gynecology;  Laterality: Bilateral;  need bedL;  CONVERTED TO OPEN ABDOMINAL HYSTERECTOMY AT 0840   LASIK     PANNICULECTOMY N/A 01/09/2023   Procedure: PANNICULECTOMY WITH LIPOSUCTION;  Surgeon: Peggye Form, DO;  Location: [facility];  Service: Plastics;  Laterality: N/A;   REDUCTION MAMMAPLASTY Bilateral 2014   TRIGGER FINGER RELEASE     2019   TRIGGER FINGER RELEASE Right 2020   R middle finger   uterine ablation     02-03-2018   Patient Active Problem List   Diagnosis Date Noted   Hx of sessile serrated colonic polyp 07/30/2023   Stress 05/15/2023   Hot flashes 05/15/2023   Primary hypertension 05/15/2023   Absolute anemia 12/28/2022   Chronic constipation 12/28/2022   External hemorrhoid 12/28/2022   Mixed hyperlipidemia 12/28/2022   Overactive bladder 12/28/2022   Other constipation 12/18/2022   BMI 34.0-34.9,adult 12/18/2022  Obesity, Beginning BMI 38.44 12/18/2022   Candidiasis, cutaneous 09/18/2022   Depression 08/21/2022   Prediabetes 07/18/2022   Other fatigue 06/14/2022   SOB (shortness of breath) on exertion 06/14/2022   Vitamin D deficiency 06/14/2022   Vitamin B 12 deficiency 06/14/2022   Health care maintenance 06/14/2022   Abdominal pannus 06/14/2022   Class 2 severe obesity with serious comorbidity and body mass index (BMI) of 38.0 to 38.9 in adult [facility]) 06/14/2022   Panniculitis 02/23/2022   Chronic low back pain 02/11/2019   Pre-diabetes 02/03/2019   Hypercholesteremia 02/03/2019   Bipolar 1 disorder (HCC) 12/31/2018   Depression, recurrent (HCC) 12/31/2018    Intermittent palpitations 11/15/2014    PCP: Allwardt, Crist Infante, PA-C  REFERRING PROVIDER: Iva Boop, MD  REFERRING DIAG: K59.09 (ICD-10-CM) - Chronic constipation K59.02 (ICD-10-CM) - Dyssynergic defecation  THERAPY DIAG:  Other muscle spasm  Unspecified lack of coordination  Muscle weakness (generalized)  Abnormal posture  Rationale for Evaluation and Treatment: Rehabilitation  ONSET DATE: since child  SUBJECTIVE:                                                                                                                                                                                           SUBJECTIVE STATEMENT: She is using squatty potty and feels like it is helping. She is drinking more, but not water. She feels like she is more relaxed with bowel movements, but not seeing increased frequency.   PAIN:  Are you having pain? Yes NPRS scale: 0/10 currently, 5/10 worst Pain location:  low back pain  Pain type: aching Pain description: intermittent   Aggravating factors: prolonged standing Relieving factors: movement/rocking   PRECAUTIONS: None  RED FLAGS: None   WEIGHT BEARING RESTRICTIONS: No  FALLS:  Has patient fallen in last 6 months? No  LIVING ENVIRONMENT: Lives with: lives with their family Lives in: House/apartment  OCCUPATION: Nurse  PLOF: Independent  PATIENT GOALS: improve ease and frequency of bowel movements   PERTINENT HISTORY:  Hysterectomy, paniculectomy   BOWEL MOVEMENT: Pain with bowel movement: Yes - very large  Type of bowel movement:Frequency 2-3 days, then some days 2-3x a day and Strain Yes - has had to dig bowel movement out sometimes  Fully empty rectum: Yes: some days Leakage: No Pads: No Fiber supplement: No  URINATION: Pain with urination: No Fully empty bladder: Yes: - Stream: Strong Urgency: No Frequency: 4-5x/day; no nocturia Leakage:  no consistent issues  Pads: No  INTERCOURSE: Pain with  intercourse: Initial Penetration - dryness  Ability to have vaginal penetration:  Yes: - Climax: WNL Marinoff Scale: 1/3  PREGNANCY: Vaginal  deliveries 0 Tearing No C-section deliveries 2 Currently pregnant No  PROLAPSE: None   OBJECTIVE:  Note: Objective measures were completed at Evaluation unless otherwise noted.  10/16/23: COGNITION: Overall cognitive status: Within functional limits for tasks assessed     SENSATION: Light touch: Appears intact Proprioception: Appears intact   FUNCTIONAL TESTS:   Single leg stance:   Rt: Lt pelvic drop   Lt: Rt pelvic drop   Squat: Rt trunk rotation GAIT: Comments: WNL  POSTURE: rounded shoulders, forward head, decreased lumbar lordosis, decreased thoracic kyphosis, and Rt iliac crest elevation  PELVIC ALIGNMENT: Lt posterior rotation   LUMBARAROM/PROM:  A/PROM A/PROM  Eval (% available)  Flexion 75  Extension 25  Right lateral flexion 50  Left lateral flexion 75  Right rotation 75  Left rotation 75   (Blank rows = not tested)    PALPATION:   General  Abdominal scar tissue restriction; tenderness midline low back                External Perineal Exam skin tags where hemorrhoids were present                             Internal Pelvic Floor pain and tenderness with deep pelvic floor palpated rectally, Lt>Rt  Patient confirms identification and approves PT to assess internal pelvic floor and treatment Yes  PELVIC MMT:   MMT eval  Internal Anal Sphincter 3/5  External Anal Sphincter 3/5, 3 second hold, 5 repeat contractions  Puborectalis 3/5, 3 second hold, 5 repeat contractions  (Blank rows = not tested)        TONE: High, Lt>Rt  PROLAPSE: Not evaluated today  TODAY'S TREATMENT:                                                                                                                              DATE:  11/06/23 Manual: Bowel mobilization in supine with supported knees Myofascial release to  ileocecal valve and thoracolumbar fascial Neuromuscular re-education: Diaphragmatic breathing training with multimodal cues: Child's pose 10 breaths Butterfly 10 breaths  Exercises: Lower trunk rotation 3 x 10 Single knee to chest 5 x bil   10/16/23  EVAL  Therapeutic activities: Squatty potty Relaxed toilet mechanics Increased water intake  Self- bowel massage Strict bowel retraining program with eating more fiber Balloon breathing while trying to have bowel movement     PATIENT EDUCATION:  Education details: See above Person educated: Patient Education method: Explanation, Demonstration, Tactile cues, Verbal cues, and Handouts Education comprehension: verbalized understanding  HOME EXERCISE PROGRAM: [id number]  ASSESSMENT:  CLINICAL IMPRESSION: Pt doing well with use of squatty potty. She is still struggling with increasing water intake and we discussed adding flavor or electrolytes to help improve compliance with this. She is able to relax more and strain less with bowel movements, but is not seeing any progress with increased frequency (still every 2-3 days).  We performed bowel mobilization and thoracolumbar fascia release today with good tolerance and improvement in mobility. She initially was forcefully extending abdomen with diaphragmatic breathing, but was able to correct for improved mobility and relaxation with use of multimodal cues. Good response to all mobility and down training activities. HEP updated. She will continue to benefit from skilled PT intervention in order to improve ease of bowel movements, increase ease of bowel movements, improve tone of pelvic floor, and begin/progress functional strengthening program.   OBJECTIVE IMPAIRMENTS: decreased activity tolerance, decreased coordination, decreased endurance, decreased strength, increased fascial restrictions, increased muscle spasms, impaired tone, and pain.   ACTIVITY LIMITATIONS:  bowel movements    PARTICIPATION LIMITATIONS: community activity and occupation  PERSONAL FACTORS: 1 comorbidity: medical history  are also affecting patient's functional outcome.   REHAB POTENTIAL: Good  CLINICAL DECISION MAKING: Stable/uncomplicated  EVALUATION COMPLEXITY: Low   GOALS: Goals reviewed with patient? Yes  SHORT TERM GOALS: Target date: 11/13/23  Pt will be independent with HEP.   Baseline: Goal status: INITIAL  2.  Pt will be able to demonstrate appropriate pelvic floor muscle lengthening with exhale as she gently bears down like having a bowel movement.  Baseline:  Goal status: INITIAL  3.  Pt will demonstrate normal pelvic floor muscle tone/length.  Baseline:  Goal status: INITIAL  4.  Pt will be independent with use of squatty potty, relaxed toileting mechanics, and improved bowel movement techniques in order to increase ease of bowel movements and complete evacuation.   Baseline:  Goal status: INITIAL  5.  Pt will be independent with diaphragmatic breathing and down training activities in order to improve pelvic floor relaxation.  Baseline:  Goal status: INITIAL    LONG TERM GOALS: Target date: 04/01/24  Pt will be independent with advanced HEP.   Baseline:  Goal status: INITIAL  2.  Pt will increase bowel movement frequency to every other day.  Baseline:  Goal status: INITIAL  3.  Pt will be able to have bowel movement without straining with use of balloon breathing. Baseline:  Goal status: INITIAL  4.  Pt will report 0/10 pain with bowel movements.  Baseline:  Goal status: INITIAL  5.  Pt will consistently drink over 50oz of water a day and eat 8g of fiber with each meal to help improve ease of bowel movements.  Baseline:  Goal status: INITIAL  6.  Pt will get at least 60 minutes of exercise a week in order to improve ease and regularity of bowel movements.  Baseline:  Goal status: INITIAL  PLAN:  PT FREQUENCY: 1-2x/week  PT DURATION: 6  months  PLANNED INTERVENTIONS: 97110-Therapeutic exercises, 97530- Therapeutic activity, 97112- Neuromuscular re-education, 97535- Self Care, 16109- Manual therapy, Dry Needling, and Biofeedback  PLAN FOR NEXT SESSION: Continue to address abdominal scar tissue; mobility and down training exercise progression. Begin core training. Address goals.    Julio Alm, PT, DPT12/09/2409:16 AM

## 2023-11-13 ENCOUNTER — Ambulatory Visit: Payer: BC Managed Care – PPO

## 2023-11-13 DIAGNOSIS — R293 Abnormal posture: Secondary | ICD-10-CM

## 2023-11-13 DIAGNOSIS — M62838 Other muscle spasm: Secondary | ICD-10-CM

## 2023-11-13 DIAGNOSIS — R279 Unspecified lack of coordination: Secondary | ICD-10-CM

## 2023-11-13 DIAGNOSIS — M6281 Muscle weakness (generalized): Secondary | ICD-10-CM

## 2023-11-13 NOTE — Therapy (Addendum)
 OUTPATIENT PHYSICAL THERAPY FEMALE PELVIC EVALUATION   Patient Name: Margaret [name] MRN: [id number] DOB:02-01-1976, 47 y.o., female Today's Date: 11/13/2023  END OF SESSION:  PT End of Session - 11/13/23 0930     Visit Number 3    Date for PT Re-Evaluation 04/01/24    Authorization Type BCBS    PT Start Time 0930    PT Stop Time 1010    PT Time Calculation (min) 40 min    Activity Tolerance Patient tolerated treatment well    Behavior During Therapy WFL for tasks assessed/performed             Past Medical History:  Diagnosis Date   Abrasion of right thumb 04/19/2015   Anxiety    Anxiety and depression    Asthma    Back pain    Carpal tunnel syndrome on both sides 03/2015   Complication of anesthesia    Dental crown present    Diabetes mellitus without complication (HCC)    Exercise-induced asthma    prn inhaler  seasonal   Fatigue    Headache associated with hormonal factors    Heart burn    Heart murmur    slight mummur   History of endometrial ablation    History of partial hysterectomy 2019   Hx of sessile serrated colonic polyp 07/30/2023   Hyperlipidemia    Infertility, female    Low serum iron    no current med.   Obesity    Palpitations    PONV (postoperative nausea and vomiting)    nausea only   Pre-diabetes    PVCs (premature ventricular contractions)    states are benign   Seasonal allergies    SOB (shortness of breath)    Trigger finger    Past Surgical History:  Procedure Laterality Date   ABDOMINAL HYSTERECTOMY     Laparoscopic assisted Dr. Norleen Skill 09-22-18   BREAST REDUCTION SURGERY Bilateral 10/27/2013   Procedure: MAMMARY REDUCTION  (BREAST) BILATERAL;  Surgeon: Kayla Pray, MD;  Location: [facility];  Service: Plastics;  Laterality: Bilateral;   CARPAL TUNNEL RELEASE Bilateral 04/22/2015   Procedure: BILATERAL CARPAL TUNNEL RELEASE;  Surgeon: Elsie Mussel, MD;  Location: [facility];   Service: Orthopedics;  Laterality: Bilateral;   CESAREAN SECTION  04/23/2002   twins   CESAREAN SECTION  03/28/2005   DILATION AND CURETTAGE OF UTERUS     LAPAROSCOPIC VAGINAL HYSTERECTOMY WITH SALPINGECTOMY Bilateral 09/22/2018   Procedure: ABDOMINAL HYSTERECTOMY WITH BILATERAL SALPINGECTOMY;  Surgeon: Skill Norleen, MD;  Location: [facility];  Service: Gynecology;  Laterality: Bilateral;  need bedL;  CONVERTED TO OPEN ABDOMINAL HYSTERECTOMY AT 0840   LASIK     PANNICULECTOMY N/A 01/09/2023   Procedure: PANNICULECTOMY WITH LIPOSUCTION;  Surgeon: Lowery Estefana RAMAN, DO;  Location: [facility];  Service: Plastics;  Laterality: N/A;   REDUCTION MAMMAPLASTY Bilateral 2014   TRIGGER FINGER RELEASE     2019   TRIGGER FINGER RELEASE Right 2020   R middle finger   uterine ablation     02-03-2018   Patient Active Problem List   Diagnosis Date Noted   Hx of sessile serrated colonic polyp 07/30/2023   Stress 05/15/2023   Hot flashes 05/15/2023   Primary hypertension 05/15/2023   Absolute anemia 12/28/2022   Chronic constipation 12/28/2022   External hemorrhoid 12/28/2022   Mixed hyperlipidemia 12/28/2022   Overactive bladder 12/28/2022   Other constipation 12/18/2022   BMI 34.0-34.9,adult 12/18/2022  Obesity, Beginning BMI 38.44 12/18/2022   Candidiasis, cutaneous 09/18/2022   Depression 08/21/2022   Prediabetes 07/18/2022   Other fatigue 06/14/2022   SOB (shortness of breath) on exertion 06/14/2022   Vitamin D  deficiency 06/14/2022   Vitamin B 12 deficiency 06/14/2022   Health care maintenance 06/14/2022   Abdominal pannus 06/14/2022   Class 2 severe obesity with serious comorbidity and body mass index (BMI) of 38.0 to 38.9 in adult [facility]) 06/14/2022   Panniculitis 02/23/2022   Chronic low back pain 02/11/2019   Pre-diabetes 02/03/2019   Hypercholesteremia 02/03/2019   Bipolar 1 disorder (HCC) 12/31/2018   Depression, recurrent (HCC) 12/31/2018    Intermittent palpitations 11/15/2014    PCP: Allwardt, Mardy HERO, PA-C  REFERRING PROVIDER: Avram Lupita BRAVO, MD  REFERRING DIAG: K59.09 (ICD-10-CM) - Chronic constipation K59.02 (ICD-10-CM) - Dyssynergic defecation  THERAPY DIAG:  Other muscle spasm  Unspecified lack of coordination  Muscle weakness (generalized)  Abnormal posture  Rationale for Evaluation and Treatment: Rehabilitation  ONSET DATE: since child  SUBJECTIVE:                                                                                                                                                                                           SUBJECTIVE STATEMENT: Pt states that she feels like things are getting a lot better. She thinks skin tag is smaller. She feels like squatty potty has made a big difference. She feels like she has increased water intake 50%. She has been rubbing belly. She feels like frequency has increased and bowel movements has gotten softer. She is unsure if she will continue appointments in January due to deductible starting over.  PAIN:  Are you having pain? Yes NPRS scale: 0/10 currently Pain location: low back pain  Pain type: aching Pain description: intermittent   Aggravating factors: prolonged standing Relieving factors: movement/rocking   PRECAUTIONS: None  RED FLAGS: None   WEIGHT BEARING RESTRICTIONS: No  FALLS:  Has patient fallen in last 6 months? No  LIVING ENVIRONMENT: Lives with: lives with their family Lives in: House/apartment  OCCUPATION: Nurse  PLOF: Independent  PATIENT GOALS: improve ease and frequency of bowel movements   PERTINENT HISTORY:  Hysterectomy, paniculectomy   BOWEL MOVEMENT: Pain with bowel movement: Yes - very large  Type of bowel movement:Frequency 2-3 days, then some days 2-3x a day and Strain Yes - has had to dig bowel movement out sometimes  Fully empty rectum: Yes: some days Leakage: No Pads: No Fiber supplement:  No  URINATION: Pain with urination: No Fully empty bladder: Yes: - Stream: Strong Urgency: No Frequency: 4-5x/day; no nocturia Leakage: no  consistent issues  Pads: No  INTERCOURSE: Pain with intercourse: Initial Penetration - dryness  Ability to have vaginal penetration:  Yes: - Climax: WNL Marinoff Scale: 1/3  PREGNANCY: Vaginal deliveries 0 Tearing No C-section deliveries 2 Currently pregnant No  PROLAPSE: None   OBJECTIVE:  Note: Objective measures were completed at Evaluation unless otherwise noted.  10/16/23: COGNITION: Overall cognitive status: Within functional limits for tasks assessed     SENSATION: Light touch: Appears intact Proprioception: Appears intact   FUNCTIONAL TESTS:   Single leg stance:   Rt: Lt pelvic drop   Lt: Rt pelvic drop   Squat: Rt trunk rotation GAIT: Comments: WNL  POSTURE: rounded shoulders, forward head, decreased lumbar lordosis, decreased thoracic kyphosis, and Rt iliac crest elevation  PELVIC ALIGNMENT: Lt posterior rotation   LUMBARAROM/PROM:  A/PROM A/PROM  Eval (% available)  Flexion 75  Extension 25  Right lateral flexion 50  Left lateral flexion 75  Right rotation 75  Left rotation 75   (Blank rows = not tested)    PALPATION:   General  Abdominal scar tissue restriction; tenderness midline low back                External Perineal Exam skin tags where hemorrhoids were present                             Internal Pelvic Floor pain and tenderness with deep pelvic floor palpated rectally, Lt>Rt  Patient confirms identification and approves PT to assess internal pelvic floor and treatment Yes  PELVIC MMT:   MMT eval  Internal Anal Sphincter 3/5  External Anal Sphincter 3/5, 3 second hold, 5 repeat contractions  Puborectalis 3/5, 3 second hold, 5 repeat contractions  (Blank rows = not tested)        TONE: High, Lt>Rt  PROLAPSE: Not evaluated today  TODAY'S TREATMENT:                                                                                                                               DATE:  11/13/23 Neuromuscular re-education: Transversus abdominus training with multimodal cues for improved motor control and breath coordination Bil UE ball press in supine with transversus abdominus and pelvic floor muscle 2 x 10 Hip adduction in supine with transversus abdominus and pelvic floor muscle 2 x 10 Bridge with hip adduction, transversus abdominus, and pelvic floor muscle 2 x 10 Seated hip adduction ball press with transversus abdominus and pelvic floor muscle 2 x 10  Seated hip abduction red band with transversus abdominus and pelvic floor muscle 2 x 10  Seated resisted march red band with transversus abdominus and pelvic floor muscle 2 x 10  Exercises: Lower trunk rotation 2 x 10  11/06/23 Manual: Bowel mobilization in supine with supported knees Myofascial release to ileocecal valve and thoracolumbar fascial Neuromuscular re-education: Diaphragmatic breathing training with multimodal cues: Child's pose 10 breaths  Butterfly 10 breaths  Exercises: Lower trunk rotation 3 x 10 Single knee to chest 5 x bil   10/16/23  EVAL  Therapeutic activities: Personal assistant Increased water intake  Self- bowel massage Strict bowel retraining program with eating more fiber Balloon breathing while trying to have bowel movement     PATIENT EDUCATION:  Education details: See above Person educated: Patient Education method: Explanation, Demonstration, Tactile cues, Verbal cues, and Handouts Education comprehension: verbalized understanding  HOME EXERCISE PROGRAM: [email or web address]  ASSESSMENT:  CLINICAL IMPRESSION: Pt started core activation training today in case she will be unable to return when deductible resets in the new year. She is seeing good progress with increased bowel movement frequency and ease. She did well with all progressions and HEP updated. She  had very good coordination of transversus abdominus with breath. She could feel mobilization of lower abdominal scar tissue with transversus abdominus contraction. She was encouraged to continue with abdominal mobilization and stretches. She will continue to benefit from skilled PT intervention in order to improve ease of bowel movements, increase ease of bowel movements, improve tone of pelvic floor, and begin/progress functional strengthening program.   OBJECTIVE IMPAIRMENTS: decreased activity tolerance, decreased coordination, decreased endurance, decreased strength, increased fascial restrictions, increased muscle spasms, impaired tone, and pain.   ACTIVITY LIMITATIONS: bowel movements   PARTICIPATION LIMITATIONS: community activity and occupation  PERSONAL FACTORS: 1 comorbidity: medical history are also affecting patient's functional outcome.   REHAB POTENTIAL: Good  CLINICAL DECISION MAKING: Stable/uncomplicated  EVALUATION COMPLEXITY: Low   GOALS: Goals reviewed with patient? Yes  SHORT TERM GOALS: Target date: 11/13/23 - updated 11/13/23  Pt will be independent with HEP.   Baseline: Goal status: MET 11/13/23  2.  Pt will be able to demonstrate appropriate pelvic floor muscle lengthening with exhale as she gently bears down like having a bowel movement.  Baseline:  Goal status: Met 11/13/23  3.  Pt will demonstrate normal pelvic floor muscle tone/length.  Baseline:  Goal status: IN PROGRESS 11/13/23  4.  Pt will be independent with use of squatty potty, relaxed toileting mechanics, and improved bowel movement techniques in order to increase ease of bowel movements and complete evacuation.   Baseline:  Goal status: MET 11/13/23  5.  Pt will be independent with diaphragmatic breathing and down training activities in order to improve pelvic floor relaxation.  Baseline:  Goal status: MET 11/13/23    LONG TERM GOALS: Target date: 04/01/24 - updated 11/13/23  Pt will  be independent with advanced HEP.   Baseline:  Goal status: IN PROGRESS 11/13/23  2.  Pt will increase bowel movement frequency to every other day.  Baseline:  Goal status: IN PROGRESS 11/13/23  3.  Pt will be able to have bowel movement without straining with use of balloon breathing. Baseline:  Goal status: IN PROGRESS 11/13/23  4.  Pt will report 0/10 pain with bowel movements.  Baseline:  Goal status: IN PROGRESS 11/13/23  5.  Pt will consistently drink over 50oz of water a day and eat 8g of fiber with each meal to help improve ease of bowel movements.  Baseline:  Goal status: IN PROGRESS 11/13/23  6.  Pt will get at least 60 minutes of exercise a week in order to improve ease and regularity of bowel movements.  Baseline:  Goal status: IN PROGRESS 11/13/23  PLAN:  PT FREQUENCY: 1-2x/week  PT DURATION: 6 months  PLANNED INTERVENTIONS: 97110-Therapeutic exercises, 97530- Therapeutic activity, 97112- Neuromuscular re-education,  02464- Self Care, 02859- Manual therapy, Dry Needling, and Biofeedback  PLAN FOR NEXT SESSION: Continue to address abdominal scar tissue; mobility and down training exercise progression. Progress core strengthening.    Josette Mares, PT, DPT12/18/2410:13 AM  PHYSICAL THERAPY DISCHARGE SUMMARY  Visits from Start of Care: 3  Current functional level related to goals / functional outcomes: Unknown   Remaining deficits: See above   Education / Equipment: HEP   Patient agrees to discharge. Patient goals were not met. Patient is being discharged due to not returning since the last visit.  Josette Mares, PT, DPT08/04/252:40 PM

## 2024-01-03 ENCOUNTER — Ambulatory Visit (INDEPENDENT_AMBULATORY_CARE_PROVIDER_SITE_OTHER): Payer: BC Managed Care – PPO | Admitting: Physician Assistant

## 2024-01-03 ENCOUNTER — Encounter: Payer: Self-pay | Admitting: Physician Assistant

## 2024-01-03 VITALS — BP 120/80 | HR 88 | Temp 97.9°F | Ht 63.0 in | Wt 199.0 lb

## 2024-01-03 DIAGNOSIS — F439 Reaction to severe stress, unspecified: Secondary | ICD-10-CM | POA: Diagnosis not present

## 2024-01-03 DIAGNOSIS — J45909 Unspecified asthma, uncomplicated: Secondary | ICD-10-CM | POA: Insufficient documentation

## 2024-01-03 DIAGNOSIS — Z131 Encounter for screening for diabetes mellitus: Secondary | ICD-10-CM | POA: Diagnosis not present

## 2024-01-03 DIAGNOSIS — Z0000 Encounter for general adult medical examination without abnormal findings: Secondary | ICD-10-CM

## 2024-01-03 DIAGNOSIS — F419 Anxiety disorder, unspecified: Secondary | ICD-10-CM | POA: Insufficient documentation

## 2024-01-03 DIAGNOSIS — R519 Headache, unspecified: Secondary | ICD-10-CM | POA: Insufficient documentation

## 2024-01-03 LAB — LIPID PANEL
Cholesterol: 189 mg/dL (ref 0–200)
HDL: 53.6 mg/dL (ref >39.00)
LDL Cholesterol: 116 mg/dL — ABNORMAL HIGH (ref 0–99)
NonHDL: 135.16
Total CHOL/HDL Ratio: 4
Triglycerides: 94 mg/dL (ref 0.0–149.0)
VLDL: 18.8 mg/dL (ref 0.0–40.0)

## 2024-01-03 LAB — COMPREHENSIVE METABOLIC PANEL
ALT: 13 U/L (ref 0–35)
AST: 13 U/L (ref 0–37)
Albumin: 4.6 g/dL (ref 3.5–5.2)
Alkaline Phosphatase: 42 U/L (ref 39–117)
BUN: 9 mg/dL (ref 6–23)
CO2: 26 meq/L (ref 19–32)
Calcium: 9.6 mg/dL (ref 8.4–10.5)
Chloride: 108 meq/L (ref 96–112)
Creatinine, Ser: 0.83 mg/dL (ref 0.40–1.20)
GFR: 82.49 mL/min (ref >60.00)
Glucose, Bld: 106 mg/dL — ABNORMAL HIGH (ref 70–99)
Potassium: 4.4 meq/L (ref 3.5–5.1)
Sodium: 146 meq/L — ABNORMAL HIGH (ref 135–145)
Total Bilirubin: 0.5 mg/dL (ref 0.2–1.2)
Total Protein: 7.3 g/dL (ref 6.0–8.3)

## 2024-01-03 LAB — HEMOGLOBIN A1C: Hgb A1c MFr Bld: 6 % (ref 4.6–6.5)

## 2024-01-03 LAB — CBC WITH DIFFERENTIAL/PLATELET
Basophils Absolute: 0 10*3/uL (ref 0.0–0.1)
Basophils Relative: 0.6 % (ref 0.0–3.0)
Eosinophils Absolute: 0.1 10*3/uL (ref 0.0–0.7)
Eosinophils Relative: 1 % (ref 0.0–5.0)
HCT: 41.6 % (ref 36.0–46.0)
Hemoglobin: 13.9 g/dL (ref 12.0–15.0)
Lymphocytes Relative: 44.2 % (ref 12.0–46.0)
Lymphs Abs: 2.8 10*3/uL (ref 0.7–4.0)
MCHC: 33.3 g/dL (ref 30.0–36.0)
MCV: 87.6 fL (ref 78.0–100.0)
Monocytes Absolute: 0.3 10*3/uL (ref 0.1–1.0)
Monocytes Relative: 4.8 % (ref 3.0–12.0)
Neutro Abs: 3.1 10*3/uL (ref 1.4–7.7)
Neutrophils Relative %: 49.4 % (ref 43.0–77.0)
Platelets: 319 10*3/uL (ref 150.0–400.0)
RBC: 4.75 Mil/uL (ref 3.87–5.11)
RDW: 13.7 % (ref 11.5–15.5)
WBC: 6.3 10*3/uL (ref 4.0–10.5)

## 2024-01-03 NOTE — Progress Notes (Signed)
 Patient ID: Margaret [name], female    DOB: 1976-06-07, 48 y.o.   MRN: [id number]   Assessment & Plan:  Annual physical exam -     CBC with Differential/Platelet -     Comprehensive metabolic panel -     Hemoglobin A1c -     Lipid panel  Stress    Age-appropriate screening and counseling performed today. Will check labs and call with results. Preventive measures discussed and printed in AVS for patient.   Patient Counseling: [x]   Nutrition: Stressed importance of moderation in sodium/caffeine intake, saturated fat and cholesterol, caloric balance, sufficient intake of fresh fruits, vegetables, and fiber.  [x]   Stressed the importance of regular exercise.   [x]   Substance Abuse: Discussed cessation/primary prevention of tobacco, alcohol, or other drug use; driving or other dangerous activities under the influence; availability of treatment for abuse.   []   Injury prevention: Discussed safety belts, safety helmets, smoke detector, smoking near bedding or upholstery.   []   Sexuality: Discussed sexually transmitted diseases, partner selection, use of condoms, avoidance of unintended pregnancy  and contraceptive alternatives.   [x]   Dental health: Discussed importance of regular tooth brushing, flossing, and dental visits.  [x]   Health maintenance and immunizations reviewed. Please refer to Health maintenance section.    Stress - continue Wellbutrin  XL 300 mg daily, counseling     Return in about 1 year (around 01/02/2025) for physical.    Subjective:    Chief Complaint  Patient presents with   Annual Exam    Pt in office for annual CPE; pt due for fasting labs and pt is fasting today;     HPI  History of Present Illness   Margaret [name] is a 48 year old female who presents for a routine follow-up visit.  She has a history of constipation. She has made lifestyle adjustments, such as using a step stool in the bathroom and increasing water intake, which have helped improve  her symptoms.  She experiences ongoing stress related to her 64 year old son, who has moved back home and is not maintaining good hygiene. She has attempted various strategies to address this issue, including setting specific days for laundry and cleaning, but has not seen improvement. She continues to take Wellbutrin  to manage stress related to this situation.  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 She describes this change as reducing her stress levels and allowing more flexibility in her schedule, although she notes missing the social aspects of her previous job.  She reports a weight loss of approximately 10 pounds since January, attributing this to dietary changes in preparation for an upcoming cruise. Her weight was 207 pounds at the start of her diet and is currently 197 pounds.  She has a history of skin rashes, which have resolved, and she notes no new rashes or skin changes. She experienced a rash following a recent pedicure, which she attributes to shaving her legs prior to the appointment, although she has done this before without issue.  She discusses a significant family discovery, having learned that her biological father is not the man she grew up believing was her father. This revelation came through DNA testing, which also revealed a half-brother. She describes this as a source of stress but does not report any new symptoms related to this discovery.  Past Medical History:  Diagnosis Date   Abrasion of right thumb 04/19/2015   Anxiety    Anxiety and depression    Asthma    Back pain    Carpal tunnel syndrome on both sides 03/2015   Complication of anesthesia    Dental crown present    Diabetes mellitus without complication (HCC)    Encounter for orthopedic follow-up care 09/30/2019   Exercise-induced asthma    prn inhaler   seasonal   Fatigue    Headache associated with hormonal factors    Heart burn    Heart murmur    slight mummur   History of endometrial ablation    History of partial hysterectomy 2019   Hx of sessile serrated colonic polyp 07/30/2023   Hyperlipidemia    Infertility, female    Low serum iron    no current med.   Obesity    Palpitations    PONV (postoperative nausea and vomiting)    nausea only   Pre-diabetes    PVCs (premature ventricular contractions)    states are benign   Seasonal allergies    SOB (shortness of breath)    Trigger finger     Past Surgical History:  Procedure Laterality Date   ABDOMINAL HYSTERECTOMY     Laparoscopic assisted Dr. Norleen Skill 09-22-18   BREAST REDUCTION SURGERY Bilateral 10/27/2013   Procedure: MAMMARY REDUCTION  (BREAST) BILATERAL;  Surgeon: Kayla Pray, MD;  Location: [facility];  Service: Plastics;  Laterality: Bilateral;   CARPAL TUNNEL RELEASE Bilateral 04/22/2015   Procedure: BILATERAL CARPAL TUNNEL RELEASE;  Surgeon: Elsie Mussel, MD;  Location: [facility];  Service: Orthopedics;  Laterality: Bilateral;   CESAREAN SECTION  04/23/2002   twins   CESAREAN SECTION  03/28/2005   DILATION AND CURETTAGE OF UTERUS     LAPAROSCOPIC VAGINAL HYSTERECTOMY WITH SALPINGECTOMY Bilateral 09/22/2018   Procedure: ABDOMINAL HYSTERECTOMY WITH BILATERAL SALPINGECTOMY;  Surgeon: Skill Norleen, MD;  Location: [facility];  Service: Gynecology;  Laterality: Bilateral;  need bedL;  CONVERTED TO OPEN ABDOMINAL HYSTERECTOMY AT 0840   LASIK     PANNICULECTOMY N/A 01/09/2023   Procedure: PANNICULECTOMY WITH LIPOSUCTION;  Surgeon: Lowery Estefana RAMAN, DO;  [facility];  Service: Plastics;  Laterality: N/A;   REDUCTION MAMMAPLASTY Bilateral 2014   TRIGGER FINGER RELEASE     2019   TRIGGER FINGER RELEASE Right 2020   R middle finger   uterine ablation     02-03-2018    Family History   Problem Relation Age of Onset   COPD Mother    Hypertension Mother    Depression Mother    Diabetes Mother    Early death Mother    Hyperlipidemia Mother    Anxiety disorder Mother    Sleep apnea Mother    Heart attack Father    Alcohol abuse Father    Early death Father    Mental illness Father    Sudden death Father    Bipolar disorder Father    Drug abuse Father    Alcohol abuse Brother    Mental illness Brother    Alcohol abuse Maternal Grandmother    COPD Maternal Grandmother    Alcohol abuse Maternal Grandfather    Cancer Maternal Grandfather    COPD Maternal Grandfather    Diabetes Paternal Grandmother    Alcohol abuse Paternal Grandfather    Cancer Other    COPD Other    Hypertension Other  Hyperlipidemia Other    Diabetes Other    Breast cancer Neg Hx     Social History   Tobacco Use   Smoking status: Never   Smokeless tobacco: Never  Vaping Use   Vaping status: Never Used  Substance Use Topics   Alcohol use: Yes    Comment: rarely  maybe 3 times a year   Drug use: Never     Allergies  Allergen Reactions   Dilaudid  [Hydromorphone ] Itching   Amoxicillin Hives and Swelling    Has patient had a PCN reaction causing immediate rash, facial/tongue/throat swelling, SOB or lightheadedness with hypotension: Yes Has patient had a PCN reaction causing severe rash involving mucus membranes or skin necrosis: No Has patient had a PCN reaction that required hospitalization: No Has patient had a PCN reaction occurring within the last 10 years: Yes If all of the above answers are NO, then may proceed with Cephalosporin use.   Ancef [Cefazolin] Hives   Penicillins Hives, Swelling and Itching    Has patient had a PCN reaction causing immediate rash, facial/tongue/throat swelling, SOB or lightheadedness with hypotension: Yes  Has patient had a PCN reaction causing severe rash involving mucus membranes or skin necrosis: No  Has patient had a PCN reaction that  required hospitalization: No  Has patient had a PCN reaction occurring within the last 10 years: Yes  If all of the above answers are NO, then may proceed with Cephalosporin use.   Adhesive [Tape] Other (See Comments)    SKIN IRRITATION   Keflex [Cephalexin] Rash    Review of Systems NEGATIVE UNLESS OTHERWISE INDICATED IN HPI      Objective:     BP 120/80 (BP Location: Left Arm, Patient Position: Sitting)   Pulse 88   Temp 97.9 F (36.6 C) (Temporal)   Ht 5' 3 (1.6 m)   Wt 199 lb (90.3 kg)   LMP 09/14/2018 (Exact Date)   SpO2 96%   BMI 35.25 kg/m   Wt Readings from Last 3 Encounters:  01/03/24 199 lb (90.3 kg)  10/30/23 206 lb 3.2 oz (93.5 kg)  07/24/23 198 lb (89.8 kg)    BP Readings from Last 3 Encounters:  01/03/24 120/80  10/30/23 133/88  07/24/23 (!) 151/90     Physical Exam Vitals and nursing note reviewed.  Constitutional:      Appearance: Normal appearance. She is obese. She is not toxic-appearing.  HENT:     Head: Normocephalic and atraumatic.     Right Ear: Tympanic membrane, ear canal and external ear normal.     Left Ear: Tympanic membrane, ear canal and external ear normal.     Nose: Nose normal.     Mouth/Throat:     Mouth: Mucous membranes are moist.  Eyes:     Extraocular Movements: Extraocular movements intact.     Conjunctiva/sclera: Conjunctivae normal.     Pupils: Pupils are equal, round, and reactive to light.  Cardiovascular:     Rate and Rhythm: Normal rate and regular rhythm.     Pulses: Normal pulses.     Heart sounds: Normal heart sounds.  Pulmonary:     Effort: Pulmonary effort is normal.     Breath sounds: Normal breath sounds.  Abdominal:     General: Abdomen is flat. Bowel sounds are normal.     Palpations: Abdomen is soft.  Musculoskeletal:        General: Normal range of motion.     Cervical back: Normal range of motion  and neck supple.  Skin:    General: Skin is warm and dry.  Neurological:     General: No  focal deficit present.     Mental Status: She is alert and oriented to person, place, and time.  Psychiatric:        Mood and Affect: Mood normal.        Behavior: Behavior normal.        Thought Content: Thought content normal.        Judgment: Judgment normal.        Saed Hudlow M Gardiner [place], PA-C

## 2024-01-07 ENCOUNTER — Encounter: Payer: Self-pay | Admitting: Physician Assistant

## 2024-01-16 ENCOUNTER — Ambulatory Visit: Payer: BC Managed Care – PPO | Admitting: Physician Assistant

## 2024-02-06 ENCOUNTER — Ambulatory Visit
Admission: RE | Admit: 2024-02-06 | Discharge: 2024-02-06 | Disposition: A | Discharge status: Home / Self Care | Payer: BC Managed Care – PPO | Source: Ambulatory Visit | Attending: Obstetrics and Gynecology | Admitting: Obstetrics and Gynecology

## 2024-02-06 DIAGNOSIS — Z1231 Encounter for screening mammogram for malignant neoplasm of breast: Secondary | ICD-10-CM

## 2024-02-18 ENCOUNTER — Ambulatory Visit (INDEPENDENT_AMBULATORY_CARE_PROVIDER_SITE_OTHER): Payer: BC Managed Care – PPO | Admitting: Physician Assistant

## 2024-02-18 ENCOUNTER — Encounter: Payer: Self-pay | Admitting: Physician Assistant

## 2024-02-18 DIAGNOSIS — F411 Generalized anxiety disorder: Secondary | ICD-10-CM | POA: Diagnosis not present

## 2024-02-18 DIAGNOSIS — F3342 Major depressive disorder, recurrent, in full remission: Secondary | ICD-10-CM

## 2024-02-18 MED ORDER — BUPROPION HCL ER (XL) 300 MG PO TB24: 300.0000 mg | ORAL_TABLET | Freq: Every day | ORAL | 3 refills | Status: AC

## 2024-02-18 NOTE — Progress Notes (Signed)
 [facility]  Patient ID: Margaret [name],  MRN: [id number]  PCP: Bary Leriche, PA-C  Date of Evaluation: 02/18/2024 Time spent:20 minutes  Chief Complaint:  Chief Complaint   Anxiety; Depression; Follow-up    HISTORY/CURRENT STATUS: For routine med check.  Doing well. She occas misses a dose of Wellbutrin and 'doesn't feel any difference.' She wonders if she should go off it. But sometimes she feels anxious and the Wellbutrin has helped that.  Patient is able to enjoy things.  Energy and motivation are good.  Work is going well.   No extreme sadness, tearfulness, or feelings of hopelessness.  Sleeps well most of the time. ADLs and personal hygiene are normal.   Denies any changes in concentration, making decisions, or remembering things.  Appetite has not changed.  Weight is stable.  No PA.  Denies suicidal or homicidal thoughts.  Patient denies increased energy with decreased need for sleep, increased talkativeness, racing thoughts, impulsivity or risky behaviors, increased spending, increased libido, grandiosity, increased irritability or anger, paranoia, or hallucinations.  Denies dizziness, syncope, seizures, numbness, tingling, tremor, tics, unsteady gait, slurred speech, confusion. Denies muscle or joint pain, stiffness, or dystonia.  Individual Medical History/ Review of Systems: Changes? :No       Past medications for mental health diagnoses include: Lexapro caused sexual side effects, Depakote, Effexor, Zoloft, Prozac, Trileptal, Lamictal, Wellbutrin  Allergies: Dilaudid [hydromorphone], Amoxicillin, Ancef [cefazolin], Penicillins, Adhesive [tape], and Keflex [cephalexin]  Current Medications:  Current Outpatient Medications:    ibuprofen (ADVIL) 200 MG tablet, Take 200 mg by mouth every 6 (six) hours as needed., Disp: , Rfl:    buPROPion (WELLBUTRIN XL) 300 MG 24 hr tablet, Take 1 tablet (300 mg total) by mouth daily., Disp: 90 tablet, Rfl: 3 Medication  Side Effects: none  Family Medical/ Social History: Changes? No  MENTAL HEALTH EXAM:  Last menstrual period 09/14/2018.There is no height or weight on file to calculate BMI.  General Appearance: Casual and Well Groomed  Eye Contact:  Good  Speech:  Clear and Coherent and Normal Rate  Volume:  Normal  Mood:  Euthymic  Affect:  Appropriate  Thought Process:  Goal Directed and Descriptions of Associations: Circumstantial  Orientation:  Full (Time, Place, and Person)  Thought Content: Logical   Suicidal Thoughts:  No  Homicidal Thoughts:  No  Memory:  WNL  Judgement:  Good  Insight:  Good  Psychomotor Activity:  Normal  Concentration:  Concentration: Good and Attention Span: Good  Recall:  Good  Fund of Knowledge: Good  Language: Good  Assets:  Desire for Improvement Financial Resources/Insurance Housing Resilience Transportation Vocational/Educational  ADL's:  Intact  Cognition: WNL  Prognosis:  Good   DIAGNOSES:    ICD-10-CM   1. Recurrent major depression in full remission (HCC)  F33.42     2. Generalized anxiety disorder  F41.1      Receiving Psychotherapy: No   RECOMMENDATIONS:  PDMP reviewed.  A few oxycodone given 12/25/2022. I provided 20 minutes of face to face time during this encounter, including time spent before and after the visit in records review, medical decision making, counseling pertinent to today's visit, and charting.   She's doing well so no changes. If she wants to go off the Wellbutrin at any point she will go down to 150 mg for approximately 2 weeks and then stop.  Continue Wellbutrin XL 300 mg daily. Return in 1 year.  Melony Overly, PA-C

## 2024-03-18 ENCOUNTER — Other Ambulatory Visit (HOSPITAL_COMMUNITY): Payer: Self-pay

## 2024-03-18 MED ORDER — BETAMETHASONE DIPROPIONATE AUG 0.05 % EX CREA
TOPICAL_CREAM | CUTANEOUS | 2 refills | Status: AC
  Filled 2024-03-18: qty 50, 30d supply, fill #0

## 2024-04-16 ENCOUNTER — Other Ambulatory Visit (HOSPITAL_COMMUNITY): Payer: Self-pay

## 2024-04-16 ENCOUNTER — Encounter: Payer: Self-pay | Admitting: Physician Assistant

## 2024-04-16 ENCOUNTER — Other Ambulatory Visit: Payer: Self-pay

## 2024-04-16 DIAGNOSIS — D500 Iron deficiency anemia secondary to blood loss (chronic): Secondary | ICD-10-CM

## 2024-04-16 MED ORDER — ALBUTEROL SULFATE HFA 108 (90 BASE) MCG/ACT IN AERS
2.0000 | INHALATION_SPRAY | Freq: Four times a day (QID) | RESPIRATORY_TRACT | 0 refills | Status: AC | PRN
  Filled 2024-04-16: qty 6.7, 30d supply, fill #0

## 2024-04-16 NOTE — Telephone Encounter (Signed)
 Patient is needing her inhaler she is out of town the [employer]

## 2024-04-16 NOTE — Telephone Encounter (Signed)
 Can you please look at pt msg request and advise if ok to refill inhaler? Prev filled by historical provider and last filled 2023

## 2024-04-23 ENCOUNTER — Encounter: Payer: Self-pay | Admitting: Physician Assistant

## 2024-04-23 NOTE — Telephone Encounter (Signed)
 Please see pt msg and let me know if ok to send in Rx

## 2024-04-27 ENCOUNTER — Other Ambulatory Visit (HOSPITAL_COMMUNITY): Payer: Self-pay

## 2024-04-27 ENCOUNTER — Other Ambulatory Visit (HOSPITAL_BASED_OUTPATIENT_CLINIC_OR_DEPARTMENT_OTHER): Payer: Self-pay

## 2024-04-27 ENCOUNTER — Other Ambulatory Visit: Payer: Self-pay

## 2024-04-27 MED ORDER — SCOPOLAMINE 1 MG/3DAYS TD PT72
1.0000 | MEDICATED_PATCH | TRANSDERMAL | 0 refills | Status: AC
  Filled 2024-04-27: qty 10, 30d supply, fill #0

## 2024-06-03 ENCOUNTER — Other Ambulatory Visit (HOSPITAL_COMMUNITY): Payer: Self-pay

## 2024-06-03 MED ORDER — MUPIROCIN 2 % EX OINT
TOPICAL_OINTMENT | CUTANEOUS | 0 refills | Status: AC
  Filled 2024-06-03: qty 22, 30d supply, fill #0

## 2024-07-29 ENCOUNTER — Encounter: Payer: Self-pay | Admitting: Physician Assistant

## 2024-07-29 NOTE — Telephone Encounter (Signed)
 Please see pt msg and advise on Rx

## 2024-07-31 ENCOUNTER — Encounter: Payer: Self-pay | Admitting: Family

## 2024-08-01 ENCOUNTER — Other Ambulatory Visit (HOSPITAL_BASED_OUTPATIENT_CLINIC_OR_DEPARTMENT_OTHER): Payer: Self-pay

## 2024-08-01 ENCOUNTER — Other Ambulatory Visit: Payer: Self-pay | Admitting: Physician Assistant

## 2024-08-01 MED ORDER — VALACYCLOVIR HCL 500 MG PO TABS
ORAL_TABLET | ORAL | 1 refills | Status: AC
  Filled 2024-08-01: qty 20, 10d supply, fill #0

## 2024-12-23 ENCOUNTER — Other Ambulatory Visit: Payer: Self-pay | Admitting: Obstetrics and Gynecology

## 2024-12-23 DIAGNOSIS — Z1231 Encounter for screening mammogram for malignant neoplasm of breast: Secondary | ICD-10-CM

## 2025-01-04 ENCOUNTER — Encounter: Payer: BC Managed Care – PPO | Admitting: Physician Assistant

## 2025-02-09 ENCOUNTER — Ambulatory Visit

## 2025-02-17 ENCOUNTER — Ambulatory Visit: Admitting: Physician Assistant
